# Patient Record
Sex: Female | Born: 1955 | Race: White | Hispanic: No | Smoking: Current every day smoker
Health system: Southern US, Community
[De-identification: ages and names within clinical notes are randomized; demographics above are authoritative.]

## PROBLEM LIST (undated history)

## (undated) DIAGNOSIS — G5603 Carpal tunnel syndrome, bilateral upper limbs: Secondary | ICD-10-CM

## (undated) DIAGNOSIS — K769 Liver disease, unspecified: Secondary | ICD-10-CM

## (undated) DIAGNOSIS — K5909 Other constipation: Secondary | ICD-10-CM

## (undated) DIAGNOSIS — M5136 Other intervertebral disc degeneration, lumbar region: Secondary | ICD-10-CM

## (undated) DIAGNOSIS — F32A Depression, unspecified: Secondary | ICD-10-CM

## (undated) DIAGNOSIS — E039 Hypothyroidism, unspecified: Secondary | ICD-10-CM

## (undated) DIAGNOSIS — B1920 Unspecified viral hepatitis C without hepatic coma: Secondary | ICD-10-CM

## (undated) DIAGNOSIS — H539 Unspecified visual disturbance: Secondary | ICD-10-CM

## (undated) DIAGNOSIS — F329 Major depressive disorder, single episode, unspecified: Secondary | ICD-10-CM

## (undated) DIAGNOSIS — Z87442 Personal history of urinary calculi: Secondary | ICD-10-CM

## (undated) DIAGNOSIS — R161 Splenomegaly, not elsewhere classified: Secondary | ICD-10-CM

## (undated) DIAGNOSIS — R079 Chest pain, unspecified: Secondary | ICD-10-CM

## (undated) DIAGNOSIS — I10 Essential (primary) hypertension: Secondary | ICD-10-CM

## (undated) DIAGNOSIS — I4892 Unspecified atrial flutter: Secondary | ICD-10-CM

## (undated) DIAGNOSIS — M797 Fibromyalgia: Secondary | ICD-10-CM

## (undated) DIAGNOSIS — G629 Polyneuropathy, unspecified: Secondary | ICD-10-CM

## (undated) DIAGNOSIS — D696 Thrombocytopenia, unspecified: Secondary | ICD-10-CM

## (undated) DIAGNOSIS — J449 Chronic obstructive pulmonary disease, unspecified: Secondary | ICD-10-CM

## (undated) DIAGNOSIS — I4891 Unspecified atrial fibrillation: Secondary | ICD-10-CM

## (undated) DIAGNOSIS — M51369 Other intervertebral disc degeneration, lumbar region without mention of lumbar back pain or lower extremity pain: Secondary | ICD-10-CM

## (undated) DIAGNOSIS — K7460 Unspecified cirrhosis of liver: Secondary | ICD-10-CM

## (undated) HISTORY — DX: Chronic obstructive pulmonary disease, unspecified: J44.9

## (undated) HISTORY — DX: Major depressive disorder, single episode, unspecified: F32.9

## (undated) HISTORY — DX: Other constipation: K59.09

## (undated) HISTORY — PX: CARDIAC CATHETERIZATION: SHX172

## (undated) HISTORY — DX: Liver disease, unspecified: K76.9

## (undated) HISTORY — PX: PARTIAL HYSTERECTOMY: SHX80

## (undated) HISTORY — DX: Unspecified visual disturbance: H53.9

## (undated) HISTORY — DX: Essential (primary) hypertension: I10

## (undated) HISTORY — DX: Unspecified viral hepatitis C without hepatic coma: B19.20

## (undated) HISTORY — DX: Polyneuropathy, unspecified: G62.9

## (undated) HISTORY — PX: UPPER GASTROINTESTINAL ENDOSCOPY: SHX188

## (undated) HISTORY — DX: Fibromyalgia: M79.7

## (undated) HISTORY — DX: Unspecified atrial flutter: I48.92

## (undated) HISTORY — PX: COLONOSCOPY: SHX174

## (undated) HISTORY — DX: Carpal tunnel syndrome, bilateral upper limbs: G56.03

## (undated) HISTORY — DX: Unspecified atrial fibrillation: I48.91

## (undated) HISTORY — DX: Splenomegaly, not elsewhere classified: R16.1

## (undated) HISTORY — DX: Depression, unspecified: F32.A

## (undated) SURGERY — ESOPHAGOGASTRODUODENOSCOPY (EGD) WITH PROPOFOL
Anesthesia: Monitor Anesthesia Care

---

## 1998-03-18 ENCOUNTER — Observation Stay (HOSPITAL_COMMUNITY): Admission: EM | Admit: 1998-03-18 | Discharge: 1998-03-19 | Payer: Self-pay | Admitting: Emergency Medicine

## 2002-09-24 DIAGNOSIS — B1920 Unspecified viral hepatitis C without hepatic coma: Secondary | ICD-10-CM

## 2002-09-24 HISTORY — DX: Unspecified viral hepatitis C without hepatic coma: B19.20

## 2003-09-25 HISTORY — PX: OTHER SURGICAL HISTORY: SHX169

## 2004-03-24 ENCOUNTER — Inpatient Hospital Stay (HOSPITAL_COMMUNITY): Admission: AD | Admit: 2004-03-24 | Discharge: 2004-03-25 | Payer: Self-pay | Admitting: Internal Medicine

## 2007-01-10 ENCOUNTER — Encounter: Payer: Self-pay | Admitting: Cardiology

## 2007-02-04 ENCOUNTER — Ambulatory Visit: Payer: Self-pay | Admitting: Gastroenterology

## 2007-02-04 LAB — CONVERTED CEMR LAB
ALT: 38 units/L (ref 0–40)
AST: 38 units/L — ABNORMAL HIGH (ref 0–37)
Albumin: 3.7 g/dL (ref 3.5–5.2)
Alkaline Phosphatase: 81 units/L (ref 39–117)
Amylase: 143 units/L — ABNORMAL HIGH (ref 27–131)
BUN: 11 mg/dL (ref 6–23)
Bacteria, UA: NEGATIVE
Basophils Absolute: 0.1 10*3/uL (ref 0.0–0.1)
Basophils Relative: 0.7 % (ref 0.0–1.0)
Bilirubin Urine: NEGATIVE
Bilirubin, Direct: 0.2 mg/dL (ref 0.0–0.3)
CO2: 32 meq/L (ref 19–32)
CRP, High Sensitivity: 1 (ref 0.00–5.00)
Calcium: 10.3 mg/dL (ref 8.4–10.5)
Chloride: 102 meq/L (ref 96–112)
Creatinine, Ser: 0.7 mg/dL (ref 0.4–1.2)
Crystals: NEGATIVE
Eosinophils Absolute: 0.1 10*3/uL (ref 0.0–0.6)
Eosinophils Relative: 1.5 % (ref 0.0–5.0)
GFR calc Af Amer: 114 mL/min
GFR calc non Af Amer: 95 mL/min
Glucose, Bld: 88 mg/dL (ref 70–99)
HCT: 44 % (ref 36.0–46.0)
Hemoglobin, Urine: NEGATIVE
Hemoglobin: 15 g/dL (ref 12.0–15.0)
Ketones, ur: NEGATIVE mg/dL
Leukocytes, UA: NEGATIVE
Lipase: 87 units/L — ABNORMAL HIGH (ref 11.0–59.0)
Lymphocytes Relative: 28.4 % (ref 12.0–46.0)
MCHC: 34 g/dL (ref 30.0–36.0)
MCV: 90 fL (ref 78.0–100.0)
Monocytes Absolute: 0.7 10*3/uL (ref 0.2–0.7)
Monocytes Relative: 6.7 % (ref 3.0–11.0)
Mucus, UA: NEGATIVE
Neutro Abs: 6.3 10*3/uL (ref 1.4–7.7)
Neutrophils Relative %: 62.7 % (ref 43.0–77.0)
Nitrite: NEGATIVE
Platelets: 164 10*3/uL (ref 150–400)
Potassium: 4.5 meq/L (ref 3.5–5.1)
RBC / HPF: NONE SEEN
RBC: 4.89 M/uL (ref 3.87–5.11)
RDW: 13 % (ref 11.5–14.6)
Sed Rate: 27 mm/hr — ABNORMAL HIGH (ref 0–25)
Sodium: 141 meq/L (ref 135–145)
Specific Gravity, Urine: 1.01 (ref 1.000–1.03)
TSH: 3.39 microintl units/mL (ref 0.35–5.50)
Total Bilirubin: 0.6 mg/dL (ref 0.3–1.2)
Total Protein, Urine: NEGATIVE mg/dL
Total Protein: 8 g/dL (ref 6.0–8.3)
Urine Glucose: NEGATIVE mg/dL
Urobilinogen, UA: 1 (ref 0.0–1.0)
WBC: 10 10*3/uL (ref 4.5–10.5)
pH: 7 (ref 5.0–8.0)

## 2007-02-11 ENCOUNTER — Encounter: Admission: RE | Admit: 2007-02-11 | Discharge: 2007-02-11 | Payer: Self-pay | Admitting: Gastroenterology

## 2007-02-28 DIAGNOSIS — D126 Benign neoplasm of colon, unspecified: Secondary | ICD-10-CM | POA: Insufficient documentation

## 2007-03-04 ENCOUNTER — Ambulatory Visit: Payer: Self-pay | Admitting: Gastroenterology

## 2007-03-10 ENCOUNTER — Ambulatory Visit: Payer: Self-pay | Admitting: Gastroenterology

## 2007-03-10 ENCOUNTER — Encounter: Payer: Self-pay | Admitting: Gastroenterology

## 2007-03-10 DIAGNOSIS — K2100 Gastro-esophageal reflux disease with esophagitis, without bleeding: Secondary | ICD-10-CM | POA: Insufficient documentation

## 2007-03-21 ENCOUNTER — Ambulatory Visit: Payer: Self-pay | Admitting: Gastroenterology

## 2007-03-25 ENCOUNTER — Other Ambulatory Visit: Admission: RE | Admit: 2007-03-25 | Discharge: 2007-03-25 | Payer: Self-pay | Admitting: Gynecology

## 2008-01-21 DIAGNOSIS — F329 Major depressive disorder, single episode, unspecified: Secondary | ICD-10-CM | POA: Insufficient documentation

## 2008-01-21 DIAGNOSIS — I10 Essential (primary) hypertension: Secondary | ICD-10-CM | POA: Insufficient documentation

## 2008-10-08 ENCOUNTER — Encounter: Payer: Self-pay | Admitting: Cardiology

## 2008-10-14 ENCOUNTER — Encounter: Payer: Self-pay | Admitting: Cardiology

## 2008-10-19 ENCOUNTER — Encounter: Payer: Self-pay | Admitting: Cardiology

## 2008-10-20 ENCOUNTER — Ambulatory Visit: Payer: Self-pay | Admitting: Cardiology

## 2008-10-21 ENCOUNTER — Ambulatory Visit: Payer: Self-pay | Admitting: Cardiovascular Disease

## 2008-10-21 ENCOUNTER — Inpatient Hospital Stay (HOSPITAL_COMMUNITY): Admission: AD | Admit: 2008-10-21 | Discharge: 2008-10-22 | Payer: Self-pay | Admitting: Cardiovascular Disease

## 2008-10-21 ENCOUNTER — Encounter: Payer: Self-pay | Admitting: Cardiology

## 2008-11-16 ENCOUNTER — Ambulatory Visit: Payer: Self-pay | Admitting: Cardiology

## 2009-04-25 DIAGNOSIS — E785 Hyperlipidemia, unspecified: Secondary | ICD-10-CM | POA: Insufficient documentation

## 2009-04-25 DIAGNOSIS — I2510 Atherosclerotic heart disease of native coronary artery without angina pectoris: Secondary | ICD-10-CM | POA: Insufficient documentation

## 2009-04-25 DIAGNOSIS — E663 Overweight: Secondary | ICD-10-CM | POA: Insufficient documentation

## 2009-05-09 ENCOUNTER — Telehealth: Payer: Self-pay | Admitting: Cardiology

## 2009-05-14 ENCOUNTER — Ambulatory Visit: Payer: Self-pay | Admitting: Cardiology

## 2009-08-22 ENCOUNTER — Telehealth (INDEPENDENT_AMBULATORY_CARE_PROVIDER_SITE_OTHER): Payer: Self-pay | Admitting: *Deleted

## 2010-02-13 ENCOUNTER — Ambulatory Visit: Payer: Self-pay | Admitting: Cardiology

## 2010-10-15 ENCOUNTER — Encounter: Payer: Self-pay | Admitting: Gastroenterology

## 2010-10-15 ENCOUNTER — Encounter: Payer: Self-pay | Admitting: Endocrinology

## 2010-10-24 NOTE — Progress Notes (Signed)
Summary: Needs Plavix Refill      Allergies Added: SULFAMETHOXAZOLE (SULFAMETHOXAZOLE) SULFAMETHOXAZOLE (SULFAMETHOXAZOLE) SULFAMETHOXAZOLE (SULFAMETHOXAZOLE) SULFAMETHOXAZOLE (SULFAMETHOXAZOLE) AMPICILLIN (AMPICILLIN) AMPICILLIN (AMPICILLIN) Phone Note Call from Patient Call back at Home Phone [phone]   Summary of Call: Pt left message on voicemail stating she has 1 week of her Plavix left. She receives this through the assistance program. Spoke with Clydie Braun at [employer]. Pt's Plavix will be shipped and should be received within 5-7 days.   Left message for patient to call back. Initial call taken by: Cyril Loosen, RN, BSN,  May 09, 2009 5:03 PM  Follow-up for Phone Call        Pt notified. Pt verbalized understanding.  Follow-up by: Cyril Loosen, RN, BSN,  May 10, 2009 2:52 PM   New Allergies: SULFAMETHOXAZOLE (SULFAMETHOXAZOLE) SULFAMETHOXAZOLE (SULFAMETHOXAZOLE) SULFAMETHOXAZOLE (SULFAMETHOXAZOLE) SULFAMETHOXAZOLE (SULFAMETHOXAZOLE) AMPICILLIN (AMPICILLIN) AMPICILLIN (AMPICILLIN) New Allergies: SULFAMETHOXAZOLE (SULFAMETHOXAZOLE) SULFAMETHOXAZOLE (SULFAMETHOXAZOLE) SULFAMETHOXAZOLE (SULFAMETHOXAZOLE) SULFAMETHOXAZOLE (SULFAMETHOXAZOLE) AMPICILLIN (AMPICILLIN) AMPICILLIN (AMPICILLIN)

## 2010-10-24 NOTE — Procedures (Signed)
Summary: Gastroenterology Col  Gastroenterology Col   Imported By: June McMurray CMA 01/22/2008 10:25:00  _____________________________________________________________________  External Attachment:    Type:   Image     Comment:   External Document

## 2010-10-24 NOTE — Procedures (Signed)
Summary: Gastroenterology Egd  Gastroenterology Egd   Imported By: June McMurray CMA 01/22/2008 10:25:53  _____________________________________________________________________  External Attachment:    Type:   Image     Comment:   External Document

## 2010-10-24 NOTE — Progress Notes (Signed)
Summary: plavix pt. assistance  Phone Note Call from Patient   Summary of Call: Request to refill Plavix thru patient assistance.  Also, states she is totally out.  Will give #8 samples.  Lot [id number]  Exp. 10/2010. Plavix order placed for refill, should arrive 5-7 days.  Patient verbalized understanding.  Initial call taken by: Hoover Brunette, LPN,  August 22, 2009 4:58 PM

## 2011-01-08 LAB — GLUCOSE, CAPILLARY
Glucose-Capillary: 152 mg/dL — ABNORMAL HIGH (ref 70–99)
Glucose-Capillary: 68 mg/dL — ABNORMAL LOW (ref 70–99)
Glucose-Capillary: 86 mg/dL (ref 70–99)
Glucose-Capillary: 90 mg/dL (ref 70–99)
Glucose-Capillary: 90 mg/dL (ref 70–99)

## 2011-01-08 LAB — CBC
HCT: 41.7 % (ref 36.0–46.0)
Hemoglobin: 14 g/dL (ref 12.0–15.0)
MCHC: 33.5 g/dL (ref 30.0–36.0)
MCV: 94.8 fL (ref 78.0–100.0)
Platelets: 93 10*3/uL — ABNORMAL LOW (ref 150–400)
RBC: 4.4 MIL/uL (ref 3.87–5.11)
RDW: 13.8 % (ref 11.5–15.5)
WBC: 5.7 10*3/uL (ref 4.0–10.5)

## 2011-01-08 LAB — HEMOGLOBIN A1C
Hgb A1c MFr Bld: 6.6 % — ABNORMAL HIGH (ref 4.6–6.1)
Mean Plasma Glucose: 143 mg/dL

## 2011-01-08 LAB — BASIC METABOLIC PANEL
BUN: 9 mg/dL (ref 6–23)
CO2: 27 mEq/L (ref 19–32)
Calcium: 8.5 mg/dL (ref 8.4–10.5)
Chloride: 100 mEq/L (ref 96–112)
Creatinine, Ser: 0.59 mg/dL (ref 0.4–1.2)
GFR calc Af Amer: >60 mL/min (ref >60)
GFR calc non Af Amer: >60 mL/min (ref >60)
Glucose, Bld: 78 mg/dL (ref 70–99)
Potassium: 4.6 mEq/L (ref 3.5–5.1)
Sodium: 133 mEq/L — ABNORMAL LOW (ref 135–145)

## 2011-02-06 NOTE — Cardiovascular Report (Signed)
NAME:  Teresa Benitez, Teresa Benitez             ACCOUNT NO.:  [id number]   MEDICAL RECORD NO.:  [id number]          PATIENT TYPE:  INP   LOCATION:  2506                         FACILITY:  [facility]   PHYSICIAN:  Verne Carrow, MDDATE OF BIRTH:  1956/05/14   DATE OF PROCEDURE:  10/21/2008  DATE OF DISCHARGE:                            CARDIAC CATHETERIZATION   PRIMARY CARDIOLOGIST:  Learta Codding, MD, [facility]   PROCEDURE PERFORMED:  1. Left heart catheterization.  2. Selective coronary angiography.  3. Left ventricular angiogram.  4. Percutaneous coronary intervention with placement of a drug-eluting      stent in the mid right coronary artery.  5. Placement of an Angio-Seal femoral artery closure device.   OPERATOR:  Verne Carrow, MD   INDICATIONS:  This is a 55 year old obese Caucasian female with a  history of diabetes mellitus, hypertension, hyperlipidemia and tobacco  abuse who presented to [facility] [place]  complaints of chest pain.  She ruled out for a myocardial infarction  with serial cardiac enzymes, however, she was referred for a stress  perfusion test which showed a possible anterior reversible defect as  well as a reversible mid, basal inferior defect.  The patient was sent  to [facility] today for a left heart catheterization.   DETAILS OF PROCEDURE:  The patient was brought to the Inpatient Cardiac  Catheterization Laboratory after signing informed consent for the  procedure.  The right groin was prepped and draped in a sterile fashion.  Lidocaine 1% was used for local anesthesia.  A 5-French sheath was  inserted into the right femoral artery without difficulty.  Standard  diagnostic catheters were used to perform the selective coronary  angiography.  A 5-French pigtail catheter was used across the aortic  valve into the left ventricle.  Following the performance of a left  ventricular angiogram, the catheter was pulled back  across the aortic  valve with no significant pressure gradient measured.   At this point of the case, we elected to proceed to intervention of the  mid right coronary artery.  The patient was given a bolus of Angiomax  and a drip was started.  She was given 600 mg of Plavix on the cath  table.  A JR-4 guiding catheter was used to selectively engage the right  coronary artery.  A Cougar intracoronary wire was then passed down the  right coronary artery without difficulty.  A 2.5 x 12-mm balloon was  used for predilatation of the lesion.  A 3.0 x 18 mm Endeavor drug-  eluting stent was placed in the mid right coronary artery.  A 3.25 x 15-  mm noncompliant balloon was used for postdilatation of the lesion.  The  stenosis prior to the intervention was 90% and following the  intervention was 0%.  There was excellent flow down the vessel following  the intervention.  The patient tolerated the procedure well.  An Angio-  Seal femoral artery closure device was placed in the right femoral  artery for hemostasis.  The patient was taken to the holding area in  stable condition.  ANGIOGRAPHIC FINDINGS:  1. The left main coronary artery has no significant disease.  2. The left anterior descending has a 30% plaque in the proximal      portion and a 30% plaque in the midportion.  3. The circumflex artery has no significant obstructive disease.  4. The right coronary artery has a 90% stenosis in the midportion of      the vessel.  There is plaque disease noted in the posterior      descending branch.  5. Left ventricular angiogram shows normal left ventricular systolic      function with no wall motion abnormalities.  Ejection fraction is      estimated at 55-60%.  There is no evidence of mitral regurgitation.   HEMODYNAMIC DATA:  Central aortic pressure 118/77.  Left ventricular  pressure 112/5, end-diastolic pressure 16.   IMPRESSION:  1. Single-vessel coronary artery disease.  2. Normal left  ventricular systolic function.  3. Successful percutaneous coronary intervention with placement of a      drug-eluting stent in the mid right coronary artery.   RECOMMENDATIONS:  The patient will be continued on aspirin and Plavix  for at least 1 year.  She will be admitted to the floor and watched  closely tonight.  Her home medications will be resumed.      Verne Carrow, MD  Electronically Signed     CM/MEDQ  D:  10/21/2008  T:  10/22/2008  Job:  [id number]   cc:   Learta Codding, MD,FACC

## 2011-02-06 NOTE — Assessment & Plan Note (Signed)
[facility]                         GASTROENTEROLOGY OFFICE NOTE   NAME:Teresa Benitez, Teresa Benitez                    MRN:          [id number]  DATE:03/21/2007                            DOB:          1956-08-11    [place] continues with constant left lower quadrant pain of unexplained  etiology.  On reviewing her records, it has been there for approximately  two years and she has had a negative GI workup and has had several CT  scans, the last one performed on Feb 11, 2007, at [facility].  She is status post hysterectomy and tubal ligation.  She has not had  gynecologic exam at least ten years.  I referred her to Dr. Lily Peer  for gynecologic exam and possible laparoscopy.   Today, she weighs 230 pounds, the blood pressure is 142/80, pulse was  72.  I could not appreciate hepatosplenomegaly, abdominal masses or  tenderness.  Bowel sounds were normal.     Vania Rea. Jarold Motto, MD, Caleen Essex, FAGA  Electronically Signed    DRP/MedQ  DD: 03/21/2007  DT: 03/21/2007  Job #: [id number]   cc:   Gaetano Hawthorne. Lily Peer, M.D.  Wyvonnia Lora

## 2011-02-06 NOTE — Assessment & Plan Note (Signed)
[facility]                          [facility] OFFICE NOTE   NAME:Teresa Benitez, Teresa Benitez                    MRN:          [id number]  DATE:11/16/2008                            DOB:          [birth year]    PRIMARY CARE PHYSICIAN:  Dr. Wyvonnia Lora.   HISTORY OF PRESENT ILLNESS:  The patient is a very pleasant 55 year old  female recently diagnosed with coronary artery disease.  The patient  underwent stenting of the right coronary artery with placement of an  Endeavor drug-eluting stent after she had a positive Cardiolite stress  study.  There was a high-grade lesion in the mid RCA.  She also has some  known hepatitis C and she is planning to see Dr. Loreta Ave in the near  future.  She has been plagued with chronic abdominal pain.  Next, from a  cardiac perspective, however, the patient is doing remarkably well.  She  is not exercising and she states that she cannot believe how she is able  to exercise, she is now doing 30 minutes on the treadmill without any  difficulty.  She feels her stamina has improved, her energy level.  She  is also moderate with her diet.  She has noted cardiovascular  complaints.  In particular, she has no palpitations, presyncope,  syncope, orthopnea, or PND.   MEDICATIONS:  1. Plavix 75 mg p.o. daily.  2. Metoprolol 50 mg p.o. b.i.d.  3. Lisinopril 25/4.5 mg p.o. b.i.d.  4. Citalopram 40 mg p.o. day.  5. Metformin 1000 mg 1 tablet p.o. b.i.d.  6. Aspirin 325 mg p.o. daily.   PHYSICAL EXAMINATION:  VITAL SIGNS:  Blood pressure 132/77, heart rate  60, weight 131 pounds.  NECK:  Normal carotid upstrokes, no carotid bruits.  LUNGS:  Clear breath sounds bilaterally.  HEART:  Regular rate and rhythm with normal S1 and S2.  No murmurs,  rubs, or gallops.  ABDOMEN:  Soft, nontender.  No rebound or guarding.  Good bowel sounds.  EXTREMITIES:  No cyanosis, clubbing, or edema.   PROBLEM LIST:  1. Coronary arteries status post  Endeavor drug-eluting stent to the      RCA.  2. Hypertension.  3. Dyslipidemia.  4. Type 2 diabetes mellitus.  5. Hepatitis C.  6. Remote history of Graves disease.  7. Status post parathyroidectomy.  8. Status post partial hysterectomy.   IMPRESSION:  1. Obesity.  2. Tobacco use.   PLAN:  1. I have counseled the patient regarding discontinuation of tobacco.  2. From a cardiac standpoint, no other changes need to be made.  The      patient is asymptomatic.  She has several nitroglycerin available.  3. The patient and I scheduled appointment with Dr. Karilyn Cota for      followup regarding her hepatitis C status.     Learta Codding, MD,FACC     GED/MedQ  DD: 11/16/2008  DT: 11/16/2008  Job #: [id number]   cc:   Wyvonnia Lora

## 2011-02-06 NOTE — Assessment & Plan Note (Signed)
[facility]                         GASTROENTEROLOGY OFFICE NOTE   Teresa Benitez, Teresa Benitez                      MRN:          [id number]  DATE:02/04/2007                            DOB:          07/20/1956    MAIN COMPLAINT:  Teresa Benitez is referred for constant unrelenting left  lower quadrant pain.   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 Geraldine Solar.  Associated with this, has been a constant left lower quadrant discomfort  that has been lasting over a year, and has been worsening in intensity.  She says it is rather constant, and rated a 10 out of 10 over the last 3  weeks.  The pain is dull pain with sharp components to the left lower  quadrant radiating around into her left back area.  She has had mild  nausea, but no emesis, and denies any upper GI or hepatobiliary  complaints.   She had similar pain a year ago, and was evaluated by Dr. Anson [place] in  [place], and had a normal endoscopy and colonoscopy.  She had recent CT  scan on January 06, 2007 in [place], which showed a 29 mm x 13 mm density  superior to the pancreas felt to be a prominent intraabdominal lymph  node.  There were some other lymph nodes in the region of the  gastrohepatic ligament, but otherwise the CT scan was unremarkable.  Because of some facial numbness, she also had MRI of the brain, which  showed some scattered areas of abnormal T2 signaling in the cerebral  white matter and brain stem, which were nonspecific, and consistent with  either ischemic disease or demyelinating disease.  Her only neurologic  complaints at this time is one of some numbness in her left leg  associated with this left lower quadrant pain.  The pain seems to be  relieved somewhat by having a bowel movement, and her stools are ribbon  shaped.  She has had no melena or hematochezia.  She does complain of  night sweats,  and has had no true fever or chills.  She has had no  associated skin rashes, joint pains, oral stomatitis, etc.  As mentioned  above, she has no specific upper GI or hepatobiliary problems.  She  follows a regular diet, and has had no significant weight loss.   PAST MEDICAL HISTORY:  Remarkable for hypertension for the last 8 years  along with greater than 10 years of depression.  She has had a previous  hysterectomy.  Previous surgery for parathyroid adenoma.   MEDICATIONS:  1. Lisinopril of questionable dose a day.  2. Metoprolol 20 mg twice a day.  3. Paxil 10 mg a day.   IN THE PAST SHE HAS HAD REACTIONS TO AMPICILLIN AND SULFA.   FAMILY HISTORY:  Noncontributory.   SOCIAL HISTORY:  She is divorced and lives with her mother.  She has a  Administrator, arts.  Works as a Soil scientist.  She does smoke daily.  Uses  ethanol socially.  Denies problems  with alcohol abuse.   REVIEW OF SYSTEMS:  Otherwise noncontributory.  She says that she had a  last menstrual period in June 2007.  On reviewing her chart, it appears  that she had a partial hysterectomy in 1998.   EXAMINATION:  Shows her to be a healthy-appearing white female, in no  acute distress.  Has stigmata of chronic liver disease.  She is 5 feet 11 inches, and weighs 234 pounds.  Blood pressure is  132/98.  Pulse was 64 and regular.  CHEST:  Clear.  She appeared to be in a regular rhythm without significant murmurs,  gallops, or rubs.  ABDOMEN:  Shows no distention or definite organomegaly.  There was some  tenderness to deep palpation of the left lower quadrant without rebound  tenderness, and no focal masses.  Bowel sounds were normal.  Peripheral extremities were generally unremarkable.  RECTAL EXAM:  Showed no masses or tenderness.  There was formed stool in  the rectal vault that was guaiac negative.  Mental status was clear.   ASSESSMENT:  Teresa Benitez has very atypical left lower quadrant pain  with rather  negative workup to date, including endoscopy and colonoscopy  a year ago.  It is certainly possible that she has some chronic  underlying inflammatory bowel disease.  It is unlikely from this pain,  location and pattern, that she pancreatitis or pancreatic carcinoma  despite the above mentioned CT scan report.   RECOMMENDATIONS:  1. Check screening lab parameters including repeat CBC, sed rate, CRP,      urinalysis, metabolic profile.  2. CT scan of the abdomen and pelvis again with contrast as soon as      possible.  3. Percodan 1 every 6 hours as needed for pain until workup completed.  4. Patient may well need followup colonoscopy exam.     Vania Rea. Jarold Motto, MD, [facility] Essex, FAGA  Electronically Signed    DRP/MedQ  DD: 02/04/2007  DT: 02/04/2007  Job #: [id number]   cc:   Wyvonnia Lora

## 2011-02-06 NOTE — Discharge Summary (Signed)
NAME:  Teresa Benitez, Teresa Benitez             ACCOUNT NO.:  [id number]   MEDICAL RECORD NO.:  [id number]          PATIENT TYPE:  INP   LOCATION:  2506                         FACILITY:  [facility]   PHYSICIAN:  Jesse Sans. Wall, MD, FACCDATE OF BIRTH:  1956/07/12   DATE OF ADMISSION:  10/21/2008  DATE OF DISCHARGE:  10/22/2008                               DISCHARGE SUMMARY   PRIMARY CARDIOLOGIST:  Learta Codding, MD, [facility].   PRIMARY CARE [place]:  Dr. Wyvonnia Lora in [place].   DISCHARGE DIAGNOSIS:  Unstable angina.   SECONDARY DIAGNOSES:  1. Coronary artery disease, status post successful percutaneous      coronary intervention and stenting of the right coronary artery      with placement of a 3.0 x 18-mm endeavor drug-eluting stent      performed this admission.  2. Hypertension.  3. Hyperlipidemia.  4. Type 2 diabetes mellitus.  5. Hepatitis C.  6. Remote history of Graves disease.  7. Status post parathyroidectomy.  8. Status post partial hysterectomy.  9. Depression.  10.Obesity.  11.Ongoing tobacco abuse.   ALLERGIES:  PENICILLIN and SULFA.   PROCEDURES:  Left heart cardiac catheterization with successful PCI and  stenting of the right coronary artery as above.   HISTORY OF PRESENT ILLNESS:  A 55 year old Caucasian female with prior  history of chest pain, status post catheterization in July 2005  revealing nonobstructive disease, who was admitted to [facility]  on October 20, 2008, secondary to recurrent chest discomfort with  associated left arm pain, dyspnea, diaphoresis, and nausea.  She ruled  out for MI and subsequently underwent adenosine Cardiolite.  This  revealed normal LV function and suggestion of a small completely  reversible mid anterior defect as well as an inferobasal defect.  There  was some concern for balanced ischemia as well.  The patient was  transferred to [facility] for further evaluation.   HOSPITAL COURSE:  Left heart cardiac catheterization was performed  on  October 21, 2008, revealing 90% stenosis in the mid right coronary  artery with otherwise nonobstructive disease.  EF was 55% with normal  wall motion.  The RCA was successfully stented with 3.0 x 18-mm endeavor  drug-eluting stent.  The patient tolerated the procedure well,  postprocedure was has not had any difficulty.  She has been educated on  the importance of smoking cessation.  We did reduce her metoprolol dose  secondary to bradycardia noted on telemetry with rates in the 40s.  This  was asymptomatic.  Ms. Whitehead will be discharged home today in good  condition.   DISCHARGE LABORATORY DATA:  Hemoglobin 14.0, hematocrit 41.7, WBC 5.7,  and platelets 93,000 (baseline was 100,000 at [facility]).  Sodium 133, potassium 4.6, chloride 100, CO2 27, BUN 9, creatinine 0.59,  glucose 78, and calcium 8.5.   DISPOSITION:  The patient will be discharged home today in good  condition.   FOLLOWUP PLANS AND APPOINTMENTS:  We have arranged for followup with Dr.  Andee Lineman on November 04, 2008, at 2:30 p.m.  She has followup with Dr.  Margo Common as previously scheduled.  DISCHARGE MEDICATIONS:  1. Aspirin 325 mg daily.  2. Plavix 75 mg daily.  3. Lopressor 50 mg b.i.d.  4. Lisinopril and hydrochlorothiazide 20/12.5 mg daily.  5. Celexa 40 mg daily.  6. Metformin 1000 mg b.i.d. to resume on October 24, 2008.  7. Nitroglycerin 0.4 mg sublingual p.r.n. chest pain.   Of note, we did not initiate statin therapy secondary to elevated LFTs  noted at [facility] with an AST of 137 and ALT of 114.  We will  defer statin initiation to the outpatient setting if felt to be  appropriate.   OUTSTANDING LABORATORY STUDIES:  None.   DURATION OF DISCHARGE ENCOUNTER:  45 minutes including physician time.       Nicolasa Ducking, ANP      Jesse Sans. Daleen Squibb, MD, [facility]  Electronically Signed    CB/MEDQ  D:  10/22/2008  T:  10/23/2008  Job:  [id number]   cc:   Wyvonnia Lora

## 2011-02-09 NOTE — Discharge Summary (Signed)
NAME:  Teresa Benitez, Teresa Benitez                         ACCOUNT NO.:  [id number]   MEDICAL RECORD NO.:  [id number]                   PATIENT TYPE:  INP   LOCATION:  4743                                 FACILITY:  [facility]   PHYSICIAN:  Pricilla Riffle, M.D.                 DATE OF BIRTH:  Feb 04, 1956   DATE OF ADMISSION:  03/24/2004  DATE OF DISCHARGE:  03/25/2004                                 DISCHARGE SUMMARY   PROCEDURES:  1. Cardiac catheterization.  2. Coronary arteriogram.  3. Left ventriculogram.   HOSPITAL COURSE:  Ms. Mcdougald is a 56 year old female with no known  history of coronary artery disease.  She went to [facility] for chest  pain that she describes as an elephant sitting on her chest.  It radiated to  her left arm.  Her EKG had some nonspecific ST and T wave changes and Dr.  Jonelle Sidle evaluated her and felt that she should be cathed.  She  was transferred to [facility].   Her cardiac catheterization showed a 20% to 30% LAD and no other coronary  artery disease.  Her EF was 60% to 65% with no MR.  Dr. Learta Codding  evaluated the films and felt that a D-dimer should be drawn to rule out PE,  which was negative at less than 0.22.  He felt that she needed smoking  cessation, increased activity and a low-fat diet.   As part of her evaluation, a lipid profile was performed.  Her lipid profile  showed a total cholesterol of 187, triglycerides 89, HDL 37, LDL 132.  Because she did not have significant coronary artery disease, no medical  therapy will be started at this time, though she may need this in the  future.  She will be encouraged to exercise and follow a heart-healthy diet.   On March 25, 2004, her groin was without ecchymosis or hematoma.  She was  ambulating without chest pain or shortness of breath and considered stable  for discharge on March 25, 2004.   FOLLOWUP:  She is to follow up as an outpatient with her primary care  physician and with  cardiology on a p.r.n. basis.   DISCHARGE CONDITION:  Improved.   DISCHARGE DIAGNOSES:  1. Chest pain, D-dimer negative for pulmonary embolus, cardiac     catheterization negative for significant coronary artery disease.  2. Hyperlipidemia.  3. Status post hysterectomy.  4. Reported history of Graves' disease.  5. Intolerance/allergy to sulfa.  6. High blood pressure.  7. Arthritis.   DISCHARGE MEDICATIONS:  1. Metoprolol 50 mg p.o. b.i.d.  2. Nifedipine 60 mg daily.  3. Effexor XR 150 mg daily.  4. Lisinopril HCT 20/25 mg p.o. daily.      Theodore Demark, P.A. [facility]                  Pricilla Riffle, M.D.  RB/MEDQ  D:  03/25/2004  T:  03/27/2004  Job:  [id number]   cc:   Wyvonnia Lora  [address]  [place]  [place] [address]  Fax: [phone]   Jonita Albee, [place] [facility]

## 2011-02-09 NOTE — Cardiovascular Report (Signed)
NAME:  Teresa Benitez, Teresa Benitez                         ACCOUNT NO.:  [id number]   MEDICAL RECORD NO.:  [id number]                   PATIENT TYPE:  INP   LOCATION:  4743                                 FACILITY:  [facility]   PHYSICIAN:  Learta Codding, M.D. [facility]             DATE OF BIRTH:  07/07/1956   DATE OF PROCEDURE:  03/24/2004  DATE OF DISCHARGE:                              CARDIAC CATHETERIZATION   PROCEDURE:  1. Left heart catheterization with selective coronary angiography.  2. Ventriculography.   PROCEDURE CARDIOLOGIST:  Learta Codding, M.D. [facility]   CARDIOLOGIST:  Jonelle Sidle, M.D. [facility]   DIAGNOSIS:  No evidence of flow limiting epicardial or coronary artery  disease.   INDICATIONS:  The patient is a 55 year old female with multiple risk factors  for coronary artery disease who reports substernal chest pain.  The patient  ruled out for myocardial infarction and had no acute EKG changes significant  for cardiac catheterization to assess her coronary anatomy.   DESCRIPTION OF PROCEDURE:  After informed consent was obtained. The patient  was brought to the catheterization laboratory.  A 6 French arterial sheath  was placed using modified Seldinger technique.  A 6 [place] and JR4  catheters were used for coronary angiography.  A 6 French angle pigtail  catheter was used for ventriculography.  At the termination of the procedure  all catheters and sheaths were removed; and no complications were  encountered.  Adequate hemostasis was obtained.   FINDINGS:  Hemodynamics:  Left ventricular pressure 115/60 mmHg, aortic  pressure 115/68 mmHg.   VENTRICULOGRAPHY:  Ejection fraction 60-65% with no segmental wall motion  abnormalities and no mitral regurgitation.   SELECTIVE CORONARY ANGIOGRAPHY:  1. The left main coronary artery was a large caliber vessel with no evidence     of flow limiting disease.  2. The left anterior descending artery.  A large caliber vessel without any  flow limiting lesions and no flow limiting lesion in the diagonal     branches.  3. The circumflex coronary artery was normal. There was a very large second     obtuse marginal branch which was free of flow limiting lesions.  The     first obtuse marginal branch was small.  4. The right coronary artery was a large caliber vessel with no evidence of     flow limiting lesions.   RECOMMENDATIONS:  No evidence of significant angiographic disease explaining  the patient's chest pain syndrome.  I suspect the patient had either  noncardiac chest pain or atypical chest pain.  Further evaluation with D-  dimmer is indicated to rule out pulmonary embolism.  It is suspected,  however, that the patient can be discharged in the morning.  Learta Codding, M.D. [facility]    GED/MEDQ  D:  03/24/2004  T:  03/26/2004  Job:  [id number]   cc:   Wyvonnia Lora  [address]  [place]  [place] [address]  Fax: [phone]   Learta Codding, M.D. [facility]   Jonelle Sidle, M.D. [facility]

## 2011-03-09 ENCOUNTER — Other Ambulatory Visit: Payer: Self-pay | Admitting: Diagnostic Neuroimaging

## 2011-03-09 DIAGNOSIS — R531 Weakness: Secondary | ICD-10-CM

## 2011-03-09 DIAGNOSIS — R9089 Other abnormal findings on diagnostic imaging of central nervous system: Secondary | ICD-10-CM

## 2011-03-09 DIAGNOSIS — R200 Anesthesia of skin: Secondary | ICD-10-CM

## 2011-03-13 ENCOUNTER — Ambulatory Visit
Admission: RE | Admit: 2011-03-13 | Discharge: 2011-03-13 | Disposition: A | Discharge status: Home / Self Care | Payer: No Typology Code available for payment source | Source: Ambulatory Visit | Attending: Diagnostic Neuroimaging | Admitting: Diagnostic Neuroimaging

## 2011-03-13 DIAGNOSIS — R200 Anesthesia of skin: Secondary | ICD-10-CM

## 2011-03-13 DIAGNOSIS — R9089 Other abnormal findings on diagnostic imaging of central nervous system: Secondary | ICD-10-CM

## 2011-03-13 DIAGNOSIS — R531 Weakness: Secondary | ICD-10-CM

## 2011-03-14 ENCOUNTER — Other Ambulatory Visit: Payer: Self-pay | Admitting: Diagnostic Neuroimaging

## 2011-03-14 DIAGNOSIS — R5381 Other malaise: Secondary | ICD-10-CM

## 2011-03-14 DIAGNOSIS — R5383 Other fatigue: Secondary | ICD-10-CM

## 2011-03-14 DIAGNOSIS — R209 Unspecified disturbances of skin sensation: Secondary | ICD-10-CM

## 2011-03-14 DIAGNOSIS — R9409 Abnormal results of other function studies of central nervous system: Secondary | ICD-10-CM

## 2011-03-23 ENCOUNTER — Other Ambulatory Visit: Payer: No Typology Code available for payment source

## 2011-04-03 ENCOUNTER — Ambulatory Visit
Admission: RE | Admit: 2011-04-03 | Discharge: 2011-04-03 | Disposition: A | Discharge status: Home / Self Care | Payer: No Typology Code available for payment source | Source: Ambulatory Visit | Attending: Diagnostic Neuroimaging | Admitting: Diagnostic Neuroimaging

## 2011-04-03 DIAGNOSIS — R5383 Other fatigue: Secondary | ICD-10-CM

## 2011-04-03 DIAGNOSIS — R9409 Abnormal results of other function studies of central nervous system: Secondary | ICD-10-CM

## 2011-04-03 DIAGNOSIS — R5381 Other malaise: Secondary | ICD-10-CM

## 2011-04-03 DIAGNOSIS — R209 Unspecified disturbances of skin sensation: Secondary | ICD-10-CM

## 2011-04-03 MED ORDER — GADOBENATE DIMEGLUMINE 529 MG/ML IV SOLN
20.0000 mL | Freq: Once | INTRAVENOUS | Status: AC | PRN
  Administered 2011-04-03: 20 mL via INTRAVENOUS

## 2013-06-05 ENCOUNTER — Encounter (INDEPENDENT_AMBULATORY_CARE_PROVIDER_SITE_OTHER): Payer: Self-pay | Admitting: *Deleted

## 2013-08-03 ENCOUNTER — Ambulatory Visit (INDEPENDENT_AMBULATORY_CARE_PROVIDER_SITE_OTHER): Payer: Medicare Other | Admitting: Internal Medicine

## 2013-08-03 ENCOUNTER — Encounter (INDEPENDENT_AMBULATORY_CARE_PROVIDER_SITE_OTHER): Payer: Self-pay | Admitting: Internal Medicine

## 2013-08-03 VITALS — BP 150/90 | HR 78 | Temp 97.4°F | Resp 16 | Ht 70.0 in | Wt 230.0 lb

## 2013-08-03 DIAGNOSIS — K7460 Unspecified cirrhosis of liver: Secondary | ICD-10-CM | POA: Insufficient documentation

## 2013-08-03 DIAGNOSIS — R1032 Left lower quadrant pain: Secondary | ICD-10-CM | POA: Insufficient documentation

## 2013-08-03 DIAGNOSIS — K5900 Constipation, unspecified: Secondary | ICD-10-CM | POA: Insufficient documentation

## 2013-08-03 DIAGNOSIS — K5909 Other constipation: Secondary | ICD-10-CM | POA: Insufficient documentation

## 2013-08-03 MED ORDER — BISACODYL 10 MG RE SUPP: 10.0000 mg | Freq: Every day | RECTAL | Status: DC | PRN

## 2013-08-03 MED ORDER — LINACLOTIDE 290 MCG PO CAPS: 290.0000 ug | ORAL_CAPSULE | Freq: Every day | ORAL | Status: DC

## 2013-08-03 MED ORDER — HYOSCYAMINE SULFATE 0.125 MG SL SUBL: 0.1250 mg | SUBLINGUAL_TABLET | Freq: Four times a day (QID) | SUBLINGUAL | Status: DC | PRN

## 2013-08-03 NOTE — Patient Instructions (Signed)
Keep stool tarry stool frequency and consistency of stools. Can use Dulcolax suppository or fleets enema daily or every other day as needed.

## 2013-08-03 NOTE — Progress Notes (Signed)
Presenting complaint;  Constipation and left-sided abdominal pain.  History of present illness;  Patient is 57 year old Caucasian female who is referred through courtesy of Dr. Wyvonnia Lora for GI evaluation. Patient states she has had constipation for more than 10 years. Lately her bowels would not move until she takes a laxative and even with laxatives she is having difficulty. She may go as many as12 days without a bowel movement. If she does not have a bowel movement every few days she feels miserable and begins to experience pain in left side of her abdomen. She states this pain goes away completely when her bowels move. Recently she went several days without a bowel movement and finally had results and she drank 2 bottles of mag citrate. She eats fiber rich foods and fruits according to her daughter. She also drinks a lot of water. She has chronic pain secondary to fibromyalgia and has to be on pain medications. She denies melena or rectal bleeding. She has nausea but no vomiting. She is presently on antibiotic for urinary tract infection. Patient's last colonoscopy was in June 2005 by Dr. Jarold Motto with removal of small polyp which was hyperplastic. She had another colonoscopy in April 2007 revealing hyperplastic polyp And a colonoscopy in June 2008 revealing tiny hyperplastic polyps. EGD in June 2008  revealed changes of mild reflux esophagitis.   Current Medications: Current Outpatient Prescriptions  Medication Sig Dispense Refill  . baclofen (LIORESAL) 20 MG tablet Take 20 mg by mouth 3 (three) times daily.      . bisacodyl (DULCOLAX) 5 MG EC tablet Take 5 mg by mouth as needed for moderate constipation.      . budesonide-formoterol (SYMBICORT) 160-4.5 MCG/ACT inhaler Inhale 1 puff into the lungs 2 (two) times daily.      . ciprofloxacin (CIPRO) 500 MG tablet Take 500 mg by mouth 2 (two) times daily.      . citalopram (CELEXA) 20 MG tablet Take 20 mg by mouth daily. Patient takes 1 1/2  tablet daily.      . clonazePAM (KLONOPIN) 1 MG tablet Take 1 mg by mouth at bedtime as needed for anxiety.      . fentaNYL (DURAGESIC - DOSED MCG/HR) 50 MCG/HR Place 50 mcg onto the skin every 3 (three) days.      . Gabapentin Enacarbil ER 300 MG TBCR Take by mouth 3 (three) times daily.      Marland Kitchen LISINOPRIL-HYDROCHLOROTHIAZIDE PO Take by mouth. Patient takes 20/25 mg daily      . MAGNESIUM CITRATE PO Take by mouth as needed.      . Naproxen Sodium (ALEVE PO) Take by mouth as needed.      . ondansetron (ZOFRAN-ODT) 4 MG disintegrating tablet Take 4 mg by mouth every 8 (eight) hours as needed for nausea or vomiting.      Marland Kitchen oxyCODONE (OXY IR/ROXICODONE) 5 MG immediate release tablet Take 5 mg by mouth as needed for severe pain.      . polyethylene glycol (MIRALAX / GLYCOLAX) packet Take 17 g by mouth daily.      . sennosides-docusate sodium (SENOKOT-S) 8.6-50 MG tablet Take 2 tablets by mouth 2 (two) times daily.      . Simethicone (GAS-X MAXIMUM STRENGTH PO) Take by mouth as needed.      . sodium phosphate (FLEET) enema Place 1 enema rectally as needed. follow package directions      . tamsulosin (FLOMAX) 0.4 MG CAPS capsule Take 0.4 mg by mouth.      Marland Kitchen  ketorolac (TORADOL) 10 MG tablet Take 10 mg by mouth every 6 (six) hours as needed.       No current facility-administered medications for this visit.   levothyroxine ? Dose.   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 Chronic hepatitis C for which he was seen by me and referred to [facility] and underwent successful therapy. He has cirrhosis possibly secondary to fatty liver and prior hepatitis C. Chronic left-sided abdominal pain. Hypertension. Obesity. She has had weight problems for over 20 years. Coronary artery disease. She had stenting to RCA in January 2010. Diabetes mellitus. Anxiety and Depression for 25 years. History of kidney stones. Hypothyroidism. COPD diagnosed 2 months  ago. Fibromyalgia was diagnosed in January this year.. Status post parathyroidectomy. C-section   Allergies; Allergies  Allergen Reactions  . Ampicillin     REACTION: unspecified  . Sulfamethoxazole     REACTION: unspecified   Family history; Father died at 47 of ruptured AAA. Mother is 64 years old and in good health. She has 2 brothers also in good health.  Social history; She is divorced. She is accompanied by her daughter who is in good health and she is one son also in good health. She is presently unemployed. She's been smoking cigarettes for 40 years and presently smoking 2 packs per day. She does not drink alcohol.   Objective: Blood pressure 150/90, pulse 78, temperature 97.4 F (36.3 C), temperature source Oral, resp. rate 16, height 5\' 10"  (1.778 m), weight 230 lb (104.327 kg).  patient is alert and in no acute distress. Conjunctiva is pink. Sclera is nonicteric Oropharyngeal mucosa is normal. No neck masses or thyromegaly noted. Cardiac exam with regular rhythm normal S1 and S2. No murmur or gallop noted. Lungs are clear to auscultation. Abdomen is obese. Bowel sounds are normal. Palpation reveals soft abdomen with mild tenderness at LLQ and left midabdomen. No masses or organomegaly noted.   No LE edema or clubbing noted.  Labs/studies Results:  lab data from 07/30/2013. WBC 5.0, H&H 13.2 and 38.9 and platelet count 91K. Bilirubin 0.3, AP 77, AST 24, ALT 18, albumin 3.3 and calcium 8.6. Serum sodium 1:30, potassium 3.5, right 91, CO2 32, BUN 12, creatinine 0.8, and glucose 123. Abdominopelvic CT films from 92 and 07/30/2013 reviewed. She has splenomegaly hepatic contour consistent with cirrhosis and stone in left kidney without obstruction. No evidence of ascites.      Assessment:  #1. Chronic constipation would appear to be most likely secondary to her medications. #2. Chronic LLQ abdominal pain possibly due to IBS and made worse with  constipation. #3. Cirrhosis. She has history of hepatitis C which has been successfully treated. She has thrombocytopenia splenomegaly and liver contour on recent CT is suggesting cirrhosis. 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 Patient must improve her lifestyle with goal of increased physical activity as tolerated and gradually weight reduction otherwise she will experience sequelae of cirrhosis within the next few years.    Recommendations;  Discontinue MiraLax and other OTC laxatives. Patient advised to discontinue Toradol  Advice to use Naprosyn only on as-needed basis. Continue high fiber diet. Linzess 290 mcg by mouth every morning. Levsin sublingual every 6 hours as needed for abdominal pain. Use Dulcolax suppository or Fleet enema daily or every other day as needed. Will request records from the [facility] regarding hep C therapy. Stool diary until office visit in 4 weeks.

## 2013-09-08 ENCOUNTER — Ambulatory Visit (INDEPENDENT_AMBULATORY_CARE_PROVIDER_SITE_OTHER): Payer: Medicaid Other | Admitting: Internal Medicine

## 2013-09-28 ENCOUNTER — Ambulatory Visit (INDEPENDENT_AMBULATORY_CARE_PROVIDER_SITE_OTHER): Payer: Medicare Other | Admitting: Internal Medicine

## 2013-09-28 ENCOUNTER — Encounter (INDEPENDENT_AMBULATORY_CARE_PROVIDER_SITE_OTHER): Payer: Self-pay | Admitting: Internal Medicine

## 2013-09-28 VITALS — BP 104/68 | HR 72 | Temp 98.9°F | Ht 70.0 in | Wt 232.8 lb

## 2013-09-28 DIAGNOSIS — R1032 Left lower quadrant pain: Secondary | ICD-10-CM

## 2013-09-28 DIAGNOSIS — B1920 Unspecified viral hepatitis C without hepatic coma: Secondary | ICD-10-CM | POA: Insufficient documentation

## 2013-09-28 DIAGNOSIS — K5900 Constipation, unspecified: Secondary | ICD-10-CM

## 2013-09-28 NOTE — Patient Instructions (Signed)
CT abdomen/pelvis with CM.  

## 2013-09-28 NOTE — Progress Notes (Signed)
Subjective:     Patient ID: Teresa Benitez, female   DOB: [birth year], 57 y.o.   MRN: [id number]  HPI  Here today for f/u of her constipation. She was last seen in November of 2014 by Dr. Laural Golden.  Hx of constipation for greater than 10 yrs.  She has left sided abdominal pain which resolves after having a BM. She tells me she has some LLQ pain which is the same pain she always has.  She says she cannot even lay on her side.  She has had a fever with this pain. No fever today. Pain for a little over a week She has some nausea. No urinary symptoms.  At times she may go as long as 12 days before having a BM.  She eats foods high in fiber and fruits.  Hx of chronic pain secondary to fibromyalgia and is on several medications.  Placed Linzess 25mcg.  She tells me she is having a BM daily. She does occasionally have diarrhea. She is satisfied with the Linzess.  Hx of Hepatitis C diagnosed 4 yrs ago and was successfully treated at [facility].  She is Genotype 2.  She was treated with interferon and Ribavirin Appetite is good. No weight loss.  She had Lithotripsy in November 2014 for a kidney stone.     Patient's last colonoscopy was in June 2005 by Dr. Sharlett Iles with removal of small polyp which was hyperplastic.  She had another colonoscopy in April 2007 revealing hyperplastic polyp And a colonoscopy in June 2008 revealing tiny hyperplastic polyps.  EGD in June 2008 revealed changes of mild reflux esophagitis.    Labs/studies Results:  lab data from 07/30/2013.  WBC 5.0, H&H 13.2 and 38.9 and platelet count 91K.  Bilirubin 0.3, AP 77, AST 24, ALT 18, albumin 3.3 and calcium 8.6.  Serum sodium 1:30, potassium 3.5, right 91, CO2 32, BUN 12, creatinine 0.8, and glucose 123.  Abdominopelvic CT films from 92 and 07/30/2013 reviewed. She has splenomegaly hepatic contour consistent with cirrhosis and stone in left kidney without obstruction. No evidence of ascites.      Review of  Systems Current Outpatient Prescriptions  Medication Sig Dispense Refill  . baclofen (LIORESAL) 20 MG tablet Take 20 mg by mouth 3 (three) times daily.      . bisacodyl (DULCOLAX) 10 MG suppository Place 1 suppository (10 mg total) rectally daily as needed for moderate constipation.  14 suppository  0  . budesonide-formoterol (SYMBICORT) 160-4.5 MCG/ACT inhaler Inhale 1 puff into the lungs 2 (two) times daily.      . ciprofloxacin (CIPRO) 500 MG tablet Take 500 mg by mouth 2 (two) times daily.      . clonazePAM (KLONOPIN) 1 MG tablet Take 1 mg by mouth at bedtime as needed for anxiety.      . fentaNYL (DURAGESIC - DOSED MCG/HR) 50 MCG/HR Place 50 mcg onto the skin every 3 (three) days.      . Gabapentin Enacarbil ER 300 MG TBCR Take by mouth 3 (three) times daily.      . hyoscyamine (LEVSIN SL) 0.125 MG SL tablet Place 1 tablet (0.125 mg total) under the tongue every 6 (six) hours as needed.  60 tablet  1  . Linaclotide (LINZESS) 290 MCG CAPS capsule Take 1 capsule (290 mcg total) by mouth daily.  30 capsule  5  . LISINOPRIL-HYDROCHLOROTHIAZIDE PO Take by mouth. Patient takes 20/25 mg daily      . Naproxen Sodium (ALEVE  PO) Take by mouth as needed.      . ondansetron (ZOFRAN-ODT) 4 MG disintegrating tablet Take 4 mg by mouth every 8 (eight) hours as needed for nausea or vomiting.      Marland Kitchen oxyCODONE (OXY IR/ROXICODONE) 5 MG immediate release tablet Take 5 mg by mouth as needed for severe pain.      . Simethicone (GAS-X MAXIMUM STRENGTH PO) Take by mouth as needed.      . sodium phosphate (FLEET) enema Place 1 enema rectally as needed. follow package directions      . tamsulosin (FLOMAX) 0.4 MG CAPS capsule Take 0.4 mg by mouth.       No current facility-administered medications for this visit.   Past Medical History  Diagnosis Date  . Chronic constipation   . Kidney stone   . Fibromyalgia   . Spleen enlarged   . Liver disease   . Hepatitis C   . Depression   . Hypertension    Past  Surgical History  Procedure Laterality Date  . Para thyhoid  2005  . Partial hysterectomy      Patient states that she had 16 years ago?1998  . Colonoscopy  5-6 years ago     Done In [place]  . Upper gastrointestinal endoscopy    . Cesarean section  1996   Allergies  Allergen Reactions  . Ampicillin     REACTION: unspecified  . Ciprofloxacin     Hives  . Sulfamethoxazole     REACTION: unspecified       Objective:   Physical Exam  Filed Vitals:   09/28/13 1529  BP: 104/68  Pulse: 72  Temp: 98.9 F (37.2 C)  Height: 5\' 10"  (1.778 m)  Weight: 232 lb 12.8 oz (105.597 kg)  Alert and oriented. Skin warm and dry. Oral mucosa is moist.   . Sclera anicteric, conjunctivae is pink. Thyroid not enlarged. No cervical lymphadenopathy. Lungs clear. Heart regular rate and rhythm.  Abdomen is soft. Bowel sounds are positive. No hepatomegaly. No abdominal masses felt. Tenderness left lower quadrant: deep and superficial.  No edema to lower extremities.        Assessment:    Left lower quadrant pain. Diverticulitis needs to be ruled out.   Hepatitis C: Successfully treated at [facility] and is followed by Dr. Wenda Overland.  Constipation which is better since starting the Linzess. Plan:    CT abdomen/pelvis with CM. Am not going to start on antibiotics until I have the CT report,. Continue the Linzess.

## 2013-10-01 ENCOUNTER — Ambulatory Visit (HOSPITAL_COMMUNITY)
Admission: RE | Admit: 2013-10-01 | Discharge: 2013-10-01 | Disposition: A | Discharge status: Home / Self Care | Payer: Medicare Other | Source: Ambulatory Visit | Attending: Internal Medicine | Admitting: Internal Medicine

## 2013-10-01 DIAGNOSIS — K7460 Unspecified cirrhosis of liver: Secondary | ICD-10-CM | POA: Insufficient documentation

## 2013-10-01 DIAGNOSIS — R1032 Left lower quadrant pain: Secondary | ICD-10-CM | POA: Insufficient documentation

## 2013-10-01 DIAGNOSIS — R110 Nausea: Secondary | ICD-10-CM | POA: Insufficient documentation

## 2013-10-01 DIAGNOSIS — K5900 Constipation, unspecified: Secondary | ICD-10-CM | POA: Insufficient documentation

## 2013-10-01 DIAGNOSIS — R161 Splenomegaly, not elsewhere classified: Secondary | ICD-10-CM | POA: Insufficient documentation

## 2013-10-01 MED ORDER — IOHEXOL 300 MG/ML  SOLN
100.0000 mL | Freq: Once | INTRAMUSCULAR | Status: AC | PRN
  Administered 2013-10-01: 100 mL via INTRAVENOUS

## 2013-10-02 ENCOUNTER — Telehealth (INDEPENDENT_AMBULATORY_CARE_PROVIDER_SITE_OTHER): Payer: Self-pay | Admitting: Internal Medicine

## 2013-10-02 NOTE — Telephone Encounter (Signed)
No answer at home. Unable to leave message.

## 2013-10-05 ENCOUNTER — Telehealth (INDEPENDENT_AMBULATORY_CARE_PROVIDER_SITE_OTHER): Payer: Self-pay | Admitting: Internal Medicine

## 2013-10-05 NOTE — Progress Notes (Signed)
Called patient to schedule her f/u apt and there was no answer.

## 2013-10-05 NOTE — Telephone Encounter (Signed)
Results given to patient. She does have nausea. There is no constipation.

## 2013-10-09 NOTE — Progress Notes (Signed)
Called and there was no answer

## 2013-10-15 NOTE — Progress Notes (Signed)
LM with husband for patient to return the call.

## 2013-10-15 NOTE — Progress Notes (Signed)
Apt has been scheduled with Dr. Laural Golden on 12/29/13.

## 2013-10-21 ENCOUNTER — Encounter (HOSPITAL_COMMUNITY): Payer: Self-pay | Admitting: Emergency Medicine

## 2013-10-21 ENCOUNTER — Emergency Department (HOSPITAL_COMMUNITY)
Admission: EM | Admit: 2013-10-21 | Discharge: 2013-10-21 | Disposition: A | Discharge status: Home / Self Care | Payer: Medicare Other | Attending: Emergency Medicine | Admitting: Emergency Medicine

## 2013-10-21 DIAGNOSIS — F3289 Other specified depressive episodes: Secondary | ICD-10-CM | POA: Insufficient documentation

## 2013-10-21 DIAGNOSIS — Z8619 Personal history of other infectious and parasitic diseases: Secondary | ICD-10-CM | POA: Insufficient documentation

## 2013-10-21 DIAGNOSIS — F329 Major depressive disorder, single episode, unspecified: Secondary | ICD-10-CM | POA: Insufficient documentation

## 2013-10-21 DIAGNOSIS — Z87442 Personal history of urinary calculi: Secondary | ICD-10-CM | POA: Insufficient documentation

## 2013-10-21 DIAGNOSIS — F17200 Nicotine dependence, unspecified, uncomplicated: Secondary | ICD-10-CM | POA: Insufficient documentation

## 2013-10-21 DIAGNOSIS — Z792 Long term (current) use of antibiotics: Secondary | ICD-10-CM | POA: Insufficient documentation

## 2013-10-21 DIAGNOSIS — K219 Gastro-esophageal reflux disease without esophagitis: Secondary | ICD-10-CM | POA: Insufficient documentation

## 2013-10-21 DIAGNOSIS — Z90711 Acquired absence of uterus with remaining cervical stump: Secondary | ICD-10-CM | POA: Insufficient documentation

## 2013-10-21 DIAGNOSIS — R109 Unspecified abdominal pain: Secondary | ICD-10-CM

## 2013-10-21 DIAGNOSIS — Z79899 Other long term (current) drug therapy: Secondary | ICD-10-CM | POA: Insufficient documentation

## 2013-10-21 DIAGNOSIS — I10 Essential (primary) hypertension: Secondary | ICD-10-CM | POA: Insufficient documentation

## 2013-10-21 DIAGNOSIS — Z8739 Personal history of other diseases of the musculoskeletal system and connective tissue: Secondary | ICD-10-CM | POA: Insufficient documentation

## 2013-10-21 LAB — URINALYSIS, ROUTINE W REFLEX MICROSCOPIC
Bilirubin Urine: NEGATIVE
Glucose, UA: NEGATIVE mg/dL
Hgb urine dipstick: NEGATIVE
Ketones, ur: NEGATIVE mg/dL
Leukocytes, UA: NEGATIVE
Nitrite: NEGATIVE
Protein, ur: NEGATIVE mg/dL
Specific Gravity, Urine: 1.015 (ref 1.005–1.030)
Urobilinogen, UA: 0.2 mg/dL (ref 0.0–1.0)
pH: 6 (ref 5.0–8.0)

## 2013-10-21 LAB — COMPREHENSIVE METABOLIC PANEL
ALT: 14 U/L (ref 0–35)
AST: 24 U/L (ref 0–37)
Albumin: 3.3 g/dL — ABNORMAL LOW (ref 3.5–5.2)
Alkaline Phosphatase: 83 U/L (ref 39–117)
BUN: 7 mg/dL (ref 6–23)
CO2: 30 mEq/L (ref 19–32)
Calcium: 9.3 mg/dL (ref 8.4–10.5)
Chloride: 94 mEq/L — ABNORMAL LOW (ref 96–112)
Creatinine, Ser: 0.56 mg/dL (ref 0.50–1.10)
GFR calc Af Amer: >90 mL/min (ref >90)
GFR calc non Af Amer: >90 mL/min (ref >90)
Glucose, Bld: 129 mg/dL — ABNORMAL HIGH (ref 70–99)
Potassium: 3.7 mEq/L (ref 3.7–5.3)
Sodium: 135 mEq/L — ABNORMAL LOW (ref 137–147)
Total Bilirubin: 0.5 mg/dL (ref 0.3–1.2)
Total Protein: 7.5 g/dL (ref 6.0–8.3)

## 2013-10-21 LAB — LIPASE, BLOOD: Lipase: 33 U/L (ref 11–59)

## 2013-10-21 LAB — CBC
HCT: 40.6 % (ref 36.0–46.0)
Hemoglobin: 13.9 g/dL (ref 12.0–15.0)
MCH: 30.5 pg (ref 26.0–34.0)
MCHC: 34.2 g/dL (ref 30.0–36.0)
MCV: 89 fL (ref 78.0–100.0)
Platelets: 85 10*3/uL — ABNORMAL LOW (ref 150–400)
RBC: 4.56 MIL/uL (ref 3.87–5.11)
RDW: 14 % (ref 11.5–15.5)
WBC: 6.8 10*3/uL (ref 4.0–10.5)

## 2013-10-21 MED ORDER — GI COCKTAIL ~~LOC~~
30.0000 mL | Freq: Once | ORAL | Status: AC
  Administered 2013-10-21: 30 mL via ORAL
  Filled 2013-10-21: qty 30

## 2013-10-21 MED ORDER — ONDANSETRON HCL 4 MG/2ML IJ SOLN
4.0000 mg | Freq: Once | INTRAMUSCULAR | Status: AC
  Administered 2013-10-21: 4 mg via INTRAVENOUS
  Filled 2013-10-21: qty 2

## 2013-10-21 MED ORDER — FAMOTIDINE 20 MG PO TABS: 20.0000 mg | ORAL_TABLET | Freq: Two times a day (BID) | ORAL | Status: DC

## 2013-10-21 MED ORDER — LANSOPRAZOLE 30 MG PO CPDR: 30.0000 mg | DELAYED_RELEASE_CAPSULE | Freq: Every day | ORAL | Status: DC

## 2013-10-21 MED ORDER — ONDANSETRON 4 MG PO TBDP: 4.0000 mg | ORAL_TABLET | Freq: Three times a day (TID) | ORAL | Status: DC | PRN

## 2013-10-21 MED ORDER — PANTOPRAZOLE SODIUM 40 MG IV SOLR
40.0000 mg | INTRAVENOUS | Status: AC
  Administered 2013-10-21: 40 mg via INTRAVENOUS
  Filled 2013-10-21: qty 40

## 2013-10-21 MED ORDER — FAMOTIDINE IN NACL 20-0.9 MG/50ML-% IV SOLN
20.0000 mg | Freq: Once | INTRAVENOUS | Status: AC
  Administered 2013-10-21: 20 mg via INTRAVENOUS
  Filled 2013-10-21: qty 50

## 2013-10-21 NOTE — ED Notes (Signed)
Pt. Reports feeling like she is on fire in her epigastric area. Pt. Reports that she feels that she cannot get a deep enough breath. O2 sat 99% respirations 18. EDP notified.

## 2013-10-21 NOTE — ED Provider Notes (Addendum)
CSN: [id number]     Arrival date & time 10/21/13  0551 History   First MD Initiated Contact with Patient 10/21/13 [phone]     Chief Complaint  Patient presents with  . Abdominal Pain   (Consider location/radiation/quality/duration/timing/severity/associated sxs/prior Treatment) HPI Comments: 58 year old female, history of "fibromyalgia", history of hepatitis C and a history of abdominal spasms which she relates to her fibromyalgia. She states that overnight this evening she has developed nausea as well as a feeling of a burning sensation in the epigastrium that radiates up into the chest and later with a bad taste in her mouth. She has never had to take anything for acid reflux before. She took a baclofen medication prior to arrival to help with the spasms, just a very good job. She has no dysuria, no diarrhea, no coughing or shortness of breath. The nausea is persistent, gradual easing off, she was able to tolerate ginger ale prior to arrival.  Patient is a 58 y.o. female presenting with abdominal pain. The history is provided by the patient.  Abdominal Pain   Past Medical History  Diagnosis Date  . Chronic constipation   . Kidney stone   . Fibromyalgia   . Spleen enlarged   . Liver disease   . Hepatitis C   . Depression   . Hypertension    Past Surgical History  Procedure Laterality Date  . Para thyhoid  2005  . Partial hysterectomy      Patient states that she had 16 years ago?1998  . Colonoscopy  5-6 years ago     Done In [place]  . Upper gastrointestinal endoscopy    . Cesarean section  1996   Family History  Problem Relation Age of Onset  . Healthy Mother   . Diabetes Father   . Heart disease Father   . Hypertension Father   . Healthy Brother   . Healthy Brother   . Healthy Daughter   . Healthy Son    History  Substance Use Topics  . Smoking status: Current Every Day Smoker -- 2.00 packs/day    Types: Cigarettes  . Smokeless tobacco: Never Used     Comment:  Patient states that she smokes 2 pack a day , has smoked since she was 47.  Marland Kitchen Alcohol Use: No   OB History   Grav Para Term Preterm Abortions TAB SAB Ect Mult Living                 Review of Systems  Gastrointestinal: Positive for abdominal pain.  All other systems reviewed and are negative.    Allergies  Ampicillin; Ciprofloxacin; and Sulfamethoxazole  Home Medications   Current Outpatient Rx  Name  Route  Sig  Dispense  Refill  . baclofen (LIORESAL) 20 MG tablet   Oral   Take 20 mg by mouth 3 (three) times daily.         . bisacodyl (DULCOLAX) 10 MG suppository   Rectal   Place 1 suppository (10 mg total) rectally daily as needed for moderate constipation.   14 suppository   0   . budesonide-formoterol (SYMBICORT) 160-4.5 MCG/ACT inhaler   Inhalation   Inhale 1 puff into the lungs 2 (two) times daily.         . ciprofloxacin (CIPRO) 500 MG tablet   Oral   Take 500 mg by mouth 2 (two) times daily.         . clonazePAM (KLONOPIN) 1 MG tablet   Oral  Take 1 mg by mouth at bedtime as needed for anxiety.         . famotidine (PEPCID) 20 MG tablet   Oral   Take 1 tablet (20 mg total) by mouth 2 (two) times daily.   30 tablet   0   . fentaNYL (DURAGESIC - DOSED MCG/HR) 50 MCG/HR   Transdermal   Place 50 mcg onto the skin every 3 (three) days.         . Gabapentin Enacarbil ER 300 MG TBCR   Oral   Take by mouth 3 (three) times daily.         . hyoscyamine (LEVSIN SL) 0.125 MG SL tablet   Sublingual   Place 1 tablet (0.125 mg total) under the tongue every 6 (six) hours as needed.   60 tablet   1   . lansoprazole (PREVACID) 30 MG capsule   Oral   Take 1 capsule (30 mg total) by mouth daily at 12 noon.   30 capsule   0   . Linaclotide (LINZESS) 290 MCG CAPS capsule   Oral   Take 1 capsule (290 mcg total) by mouth daily.   30 capsule   5   . LISINOPRIL-HYDROCHLOROTHIAZIDE PO   Oral   Take by mouth. Patient takes 20/25 mg daily          . Naproxen Sodium (ALEVE PO)   Oral   Take by mouth as needed.         . ondansetron (ZOFRAN ODT) 4 MG disintegrating tablet   Oral   Take 1 tablet (4 mg total) by mouth every 8 (eight) hours as needed for nausea.   10 tablet   0   . ondansetron (ZOFRAN-ODT) 4 MG disintegrating tablet   Oral   Take 4 mg by mouth every 8 (eight) hours as needed for nausea or vomiting.         Marland Kitchen oxyCODONE (OXY IR/ROXICODONE) 5 MG immediate release tablet   Oral   Take 5 mg by mouth as needed for severe pain.         . Simethicone (GAS-X MAXIMUM STRENGTH PO)   Oral   Take by mouth as needed.         . sodium phosphate (FLEET) enema   Rectal   Place 1 enema rectally as needed. follow package directions         . tamsulosin (FLOMAX) 0.4 MG CAPS capsule   Oral   Take 0.4 mg by mouth.          BP 147/65  Pulse 72  Temp(Src) 98.7 F (37.1 C) (Oral)  Resp 18  Ht 5\' 10"  (1.778 m)  Wt 233 lb (105.688 kg)  BMI 33.43 kg/m2  SpO2 99% Physical Exam  Nursing note and vitals reviewed. Constitutional: She appears well-developed and well-nourished. No distress.  HENT:  Head: Normocephalic and atraumatic.  Mouth/Throat: Oropharynx is clear and moist. No oropharyngeal exudate.  Eyes: Conjunctivae and EOM are normal. Pupils are equal, round, and reactive to light. Right eye exhibits no discharge. Left eye exhibits no discharge. No scleral icterus.  Neck: Normal range of motion. Neck supple. No JVD present. No thyromegaly present.  Cardiovascular: Normal rate, regular rhythm, normal heart sounds and intact distal pulses.  Exam reveals no gallop and no friction rub.   No murmur heard. Pulmonary/Chest: Effort normal and breath sounds normal. No respiratory distress. She has no wheezes. She has no rales.  Abdominal: Soft. Bowel sounds are normal. She  exhibits no distension and no mass. There is tenderness (minimal epigastric tenderness without guarding or masses).  Musculoskeletal: Normal  range of motion. She exhibits no edema and no tenderness.  Lymphadenopathy:    She has no cervical adenopathy.  Neurological: She is alert. Coordination normal.  Skin: Skin is warm and dry. No rash noted. No erythema.  Psychiatric: She has a normal mood and affect. Her behavior is normal.    ED Course  Procedures (including critical care time) Labs Review Labs Reviewed  COMPREHENSIVE METABOLIC PANEL - Abnormal; Notable for the following:    Sodium 135 (*)    Chloride 94 (*)    Glucose, Bld 129 (*)    Albumin 3.3 (*)    All other components within normal limits  CBC - Abnormal; Notable for the following:    Platelets 85 (*)    All other components within normal limits  LIPASE, BLOOD  URINALYSIS, ROUTINE W REFLEX MICROSCOPIC   Imaging Review No results found.  EKG Interpretation    Date/Time:  Wednesday October 21 2013 07:09:46 EST Ventricular Rate:  61 PR Interval:  148 QRS Duration: 92 QT Interval:  414 QTC Calculation: 416 R Axis:   44 Text Interpretation:  Normal sinus rhythm Normal ECG When compared with ECG of 22-Oct-2008 05:30, Premature ventricular complexes are no longer Present QT has shortened Confirmed by Zooey Schreurs  MD, Najae Filsaime ([id number]) on 10/21/2013 7:26:39 AM            MDM   1. Abdominal pain   2. Acid reflux    The patient is awake, alert, has a rather normal exam except for some mild epigastric tenderness, vital signs are normal, the patient appears to be in good spirits, GI cocktail, labs to rule out other sources such as pancreatitis, hepatitis, cholecystitis and urinary infection.  0700 - pt reexamined - ongoing mild ttp in the epigastrium - states that GI cocktail took pain away but after 30 minute it came back and now has ongoing burning in the upper ab dna chest into the throat.  ECG to ensure not cardiac, pepcid and protonix.    CBC shows thrombocytopenia - no leukocytosis present.  No anemia.  CMP and lipase normal.    ECG without acute  findings  Johnna Acosta, MD 10/21/13 [phone]  Meds given in ED:  Medications  famotidine (PEPCID) IVPB 20 mg (20 mg Intravenous New Bag/Given 10/21/13 0725)  gi cocktail (Maalox,Lidocaine,Donnatal) (30 mLs Oral Given 10/21/13 0613)  ondansetron (ZOFRAN) injection 4 mg (4 mg Intravenous Given 10/21/13 0614)  pantoprazole (PROTONIX) injection 40 mg (40 mg Intravenous Given 10/21/13 0724)    New Prescriptions   FAMOTIDINE (PEPCID) 20 MG TABLET    Take 1 tablet (20 mg total) by mouth 2 (two) times daily.   LANSOPRAZOLE (PREVACID) 30 MG CAPSULE    Take 1 capsule (30 mg total) by mouth daily at 12 noon.   ONDANSETRON (ZOFRAN ODT) 4 MG DISINTEGRATING TABLET    Take 1 tablet (4 mg total) by mouth every 8 (eight) hours as needed for nausea.      Johnna Acosta, MD 10/21/13 [phone]

## 2013-10-21 NOTE — ED Notes (Signed)
Patient c/o epigastric spasms since 0215.  Patient states she took a Baclofen with no relief and called EMS.  Patient sates she is out of Zofran.  EMS gave Zofran 4mg  IV for nausea en route.

## 2013-10-21 NOTE — Discharge Instructions (Signed)
Your testing is all normal except for low platelets (a type of blood cell) however this was low 5 years ago when you were evaluated with blood work.  Keep taking the pepcid and prevacid - use the over the counter mediines such as tums or rolaids.  Please call your doctor for a followup appointment within 24-48 hours. When you talk to your doctor please let them know that you were seen in the emergency department and have them acquire all of your records so that they can discuss the findings with you and formulate a treatment plan to fully care for your new and ongoing problems.

## 2013-12-29 ENCOUNTER — Ambulatory Visit (INDEPENDENT_AMBULATORY_CARE_PROVIDER_SITE_OTHER): Payer: Medicare Other | Admitting: Internal Medicine

## 2014-01-05 ENCOUNTER — Ambulatory Visit (INDEPENDENT_AMBULATORY_CARE_PROVIDER_SITE_OTHER): Payer: Medicare Other | Admitting: Internal Medicine

## 2014-03-01 ENCOUNTER — Encounter (INDEPENDENT_AMBULATORY_CARE_PROVIDER_SITE_OTHER): Payer: Self-pay

## 2014-03-15 ENCOUNTER — Encounter (INDEPENDENT_AMBULATORY_CARE_PROVIDER_SITE_OTHER): Payer: Self-pay | Admitting: Internal Medicine

## 2014-03-15 ENCOUNTER — Ambulatory Visit (INDEPENDENT_AMBULATORY_CARE_PROVIDER_SITE_OTHER): Payer: Medicare Other | Admitting: Internal Medicine

## 2014-03-15 ENCOUNTER — Encounter (INDEPENDENT_AMBULATORY_CARE_PROVIDER_SITE_OTHER): Payer: Self-pay | Admitting: *Deleted

## 2014-03-15 VITALS — BP 130/80 | HR 78 | Temp 97.9°F | Resp 18 | Ht 70.0 in | Wt 223.9 lb

## 2014-03-15 DIAGNOSIS — K7469 Other cirrhosis of liver: Secondary | ICD-10-CM

## 2014-03-15 DIAGNOSIS — R109 Unspecified abdominal pain: Secondary | ICD-10-CM

## 2014-03-15 DIAGNOSIS — K7460 Unspecified cirrhosis of liver: Secondary | ICD-10-CM

## 2014-03-15 LAB — PROTIME-INR
INR: 0.93 (ref <1.50)
Prothrombin Time: 12.4 seconds (ref 11.6–15.2)

## 2014-03-15 MED ORDER — DICYCLOMINE HCL 10 MG PO CAPS: 10.0000 mg | ORAL_CAPSULE | Freq: Three times a day (TID) | ORAL | Status: DC | PRN

## 2014-03-15 NOTE — Patient Instructions (Signed)
Physician will call with the results of CT scan and blood work when completed

## 2014-03-15 NOTE — Progress Notes (Signed)
Presenting complaint;  Left-sided abdominal pain.  Subjective:  Patient is 58 year old Caucasian female who is here for scheduled visit accompanied by her mother. She has history of cirrhosis and hepatitis C which was successfully treated at [facility] in 2011. She was last seen on 09/28/2013 for left lower quadrant abdominal pain. She had abdominal pelvic CT no abnormality to account for her pain. This study it did show changes of cirrhosis Plenum megaly DJD changes at T11 and T12 for spinal canal stenosis. She also has history of constipation and is on Linaclotide. She now presents with unrelenting pain for the last one month. She points to left upper quadrant at site of pain. Also complains of bloating. She denies nausea vomiting fever or chills. However she has profuse sweating at night. Previously pain was located in left lower quadrant and was intermittent. She gets some relief with bowel movement the pain never goes away completely. She states her stools are flat. She denies melena or rectal bleeding. She also gives a string of hemorrhoids and is using preparation H. for swelling and irritation but she denies hematochezia. She continues to complain of chronic back pain as well as lower extremity pain. She says she is not able to exercise on account of back pain and lower extremity pain. She does not have good appetite. He tries to eat fiber rich foods though. She has lost 9 pounds since his last visit of 09/28/2013. She says she had blood work by Dr. Manuella Ghazi last week and we have requested these results. She denies dysuria or hematuria. Patient is very pleased to tell me that she quit cigarette smoking on 10/21/2013.   Current Medications: Outpatient Encounter Prescriptions as of 03/15/2014  Medication Sig  . bisacodyl (DULCOLAX) 10 MG suppository Place 1 suppository (10 mg total) rectally daily as needed for moderate constipation.  . budesonide-formoterol (SYMBICORT) 160-4.5 MCG/ACT inhaler Inhale  1 puff into the lungs 2 (two) times daily.  . citalopram (CELEXA) 20 MG tablet Take 20 mg by mouth daily.   . fentaNYL (DURAGESIC - DOSED MCG/HR) 50 MCG/HR Place 50 mcg onto the skin every 3 (three) days.  . Gabapentin Enacarbil ER 300 MG TBCR Take by mouth 3 (three) times daily.  . Linaclotide (LINZESS) 290 MCG CAPS capsule Take 1 capsule (290 mcg total) by mouth daily.  Marland Kitchen LISINOPRIL-HYDROCHLOROTHIAZIDE PO Take by mouth. Patient takes 20/25 mg daily  . methocarbamol (ROBAXIN) 750 MG tablet Take 750 mg by mouth every 6 (six) hours as needed.   . Naproxen Sodium (ALEVE PO) Take by mouth as needed.  Marland Kitchen oxyCODONE (OXY IR/ROXICODONE) 5 MG immediate release tablet Take 5 mg by mouth as needed for severe pain.  . Simethicone (GAS-X MAXIMUM STRENGTH PO) Take by mouth as needed.  . sodium phosphate (FLEET) enema Place 1 enema rectally as needed. follow package directions  . dicyclomine (BENTYL) 10 MG capsule Take 1 capsule (10 mg total) by mouth 3 (three) times daily as needed for spasms.  . [DISCONTINUED] baclofen (LIORESAL) 20 MG tablet Take 20 mg by mouth 3 (three) times daily.  . [DISCONTINUED] ciprofloxacin (CIPRO) 500 MG tablet Take 500 mg by mouth 2 (two) times daily.  . [DISCONTINUED] clonazePAM (KLONOPIN) 1 MG tablet Take 1 mg by mouth at bedtime as needed for anxiety.  . [DISCONTINUED] famotidine (PEPCID) 20 MG tablet Take 1 tablet (20 mg total) by mouth 2 (two) times daily.  . [DISCONTINUED] hyoscyamine (LEVSIN SL) 0.125 MG SL tablet Place 1 tablet (0.125 mg total) under the  tongue every 6 (six) hours as needed.  . [DISCONTINUED] lansoprazole (PREVACID) 30 MG capsule Take 1 capsule (30 mg total) by mouth daily at 12 noon.  . [DISCONTINUED] ondansetron (ZOFRAN ODT) 4 MG disintegrating tablet Take 1 tablet (4 mg total) by mouth every 8 (eight) hours as needed for nausea.  . [DISCONTINUED] ondansetron (ZOFRAN-ODT) 4 MG disintegrating tablet Take 4 mg by mouth every 8 (eight) hours as needed for  nausea or vomiting.  . [DISCONTINUED] tamsulosin (FLOMAX) 0.4 MG CAPS capsule Take 0.4 mg by mouth.   Past Medical History  Diagnosis Date  . Chronic constipation   . Kidney stone   . Fibromyalgia   .    .  cirrhosis possibly due to history of hep C and fatty liver; she also gives history of excessive alcohol intake; she quit drinking over 5 years ago.   . Hepatitis C; history of hepatitis C genotype 2 ; she was treated at [facility] in 2011 has remained an SVR    . Depression   . Hypertension    Past Surgical History  Procedure Laterality Date  . Para thyhoid  2005  . Partial hysterectomy      Patient states that she had 16 years ago?1998  . Colonoscopy  5-6 years ago     Done In [place]  . Upper gastrointestinal endoscopy    . Cesarean section  1996      Objective: Blood pressure 130/80, pulse 78, temperature 97.9 F (36.6 C), temperature source Oral, resp. rate 18, height 5\' 10"  (1.778 m), weight 223 lb 14.4 oz (101.56 kg). Patient is alert and in no acute distress. Asterixis absent. Conjunctiva is pink. Sclera is nonicteric Oropharyngeal mucosa is normal. No neck masses or thyromegaly noted. Cardiac exam with regular rhythm normal S1 and S2. Faint systolic ejection murmur noted at LLSB. Lungs are clear to auscultation. Abdomen is full. Bowel sounds are normal. On palpation abdomen is soft with mild to moderate tenderness at LUQ; spleen is not palpable; liver edge is firm below RCM. No LE edema or clubbing noted.  Labs/studies Results: Lab data from 03/12/2014 WBC 7.0, H&H 14.9 and 44.1. Platelet count 135K Bilirubin 0.5, AP 74, AST 36,  ALT 27, serum albumin 4.1. BUN 12, creatinine 0.83 Serum sodium 138, potassium 3.5, chloride 98, CO2 29 Serum calcium 10. TSH 21.23    Assessment:  #1. Left upper quadrant abdominal pain. She has unrelenting pain she does not have any other symptoms to suggest diverticulitis or urolithiasis. Suspect this pain may be due to  constipation predominant IBS but given severity of her pain further workup is warranted. She has undergone 3 colonoscopies in the past most recently in June 2008 revealing hyperplastic polyps. #2. Cirrhosis. Cirrhosis felt to be multifactorial. Hep C was eradicated over 4 years ago.. She has history of excessive alcohol use in the past and now she possibly has fatty liver as well. Liver disease is compensated. She is due for screening for [place] #3. Elevated TSH. She will followup with Dr. Manuella Ghazi regarding this.    Plan:  Dicyclomine 10 mg po tid prn. A/P CT with contrast. AFP and INR. I will contact patient with results of these studies and determine timing of her next office visit to

## 2014-03-16 LAB — AFP TUMOR MARKER: AFP-Tumor Marker: 4.3 ng/mL (ref 0.0–8.0)

## 2014-03-19 ENCOUNTER — Encounter (INDEPENDENT_AMBULATORY_CARE_PROVIDER_SITE_OTHER): Payer: Self-pay

## 2014-03-25 ENCOUNTER — Ambulatory Visit (HOSPITAL_COMMUNITY): Admission: RE | Admit: 2014-03-25 | Payer: Medicare Other | Source: Ambulatory Visit

## 2014-03-30 ENCOUNTER — Ambulatory Visit (HOSPITAL_COMMUNITY)
Admission: RE | Admit: 2014-03-30 | Discharge: 2014-03-30 | Disposition: A | Discharge status: Home / Self Care | Payer: Medicare Other | Source: Ambulatory Visit | Attending: Internal Medicine | Admitting: Internal Medicine

## 2014-03-30 DIAGNOSIS — K7460 Unspecified cirrhosis of liver: Secondary | ICD-10-CM | POA: Diagnosis not present

## 2014-03-30 DIAGNOSIS — R161 Splenomegaly, not elsewhere classified: Secondary | ICD-10-CM | POA: Diagnosis not present

## 2014-03-30 DIAGNOSIS — Z90710 Acquired absence of both cervix and uterus: Secondary | ICD-10-CM | POA: Diagnosis not present

## 2014-03-30 DIAGNOSIS — K766 Portal hypertension: Secondary | ICD-10-CM | POA: Diagnosis not present

## 2014-03-30 DIAGNOSIS — R109 Unspecified abdominal pain: Secondary | ICD-10-CM

## 2014-03-30 DIAGNOSIS — D3500 Benign neoplasm of unspecified adrenal gland: Secondary | ICD-10-CM | POA: Diagnosis not present

## 2014-03-30 DIAGNOSIS — N200 Calculus of kidney: Secondary | ICD-10-CM | POA: Insufficient documentation

## 2014-03-30 MED ORDER — IOHEXOL 300 MG/ML  SOLN
100.0000 mL | Freq: Once | INTRAMUSCULAR | Status: AC | PRN
  Administered 2014-03-30: 100 mL via INTRAVENOUS

## 2014-07-26 ENCOUNTER — Other Ambulatory Visit (INDEPENDENT_AMBULATORY_CARE_PROVIDER_SITE_OTHER): Payer: Self-pay | Admitting: Internal Medicine

## 2014-11-04 ENCOUNTER — Encounter (INDEPENDENT_AMBULATORY_CARE_PROVIDER_SITE_OTHER): Payer: Self-pay | Admitting: *Deleted

## 2014-12-14 ENCOUNTER — Ambulatory Visit (INDEPENDENT_AMBULATORY_CARE_PROVIDER_SITE_OTHER): Payer: Medicare Other | Admitting: Internal Medicine

## 2015-02-12 ENCOUNTER — Observation Stay (HOSPITAL_COMMUNITY)
Admission: EM | Admit: 2015-02-12 | Discharge: 2015-02-14 | Disposition: A | Discharge status: Home / Self Care | Payer: Medicare Other | Attending: Cardiovascular Disease | Admitting: Cardiovascular Disease

## 2015-02-12 ENCOUNTER — Emergency Department (HOSPITAL_COMMUNITY): Payer: Medicare Other

## 2015-02-12 ENCOUNTER — Encounter (HOSPITAL_COMMUNITY): Payer: Self-pay | Admitting: *Deleted

## 2015-02-12 DIAGNOSIS — R079 Chest pain, unspecified: Secondary | ICD-10-CM | POA: Diagnosis not present

## 2015-02-12 DIAGNOSIS — Z880 Allergy status to penicillin: Secondary | ICD-10-CM | POA: Insufficient documentation

## 2015-02-12 DIAGNOSIS — Z882 Allergy status to sulfonamides status: Secondary | ICD-10-CM | POA: Insufficient documentation

## 2015-02-12 DIAGNOSIS — Z8249 Family history of ischemic heart disease and other diseases of the circulatory system: Secondary | ICD-10-CM | POA: Diagnosis not present

## 2015-02-12 DIAGNOSIS — B1920 Unspecified viral hepatitis C without hepatic coma: Secondary | ICD-10-CM | POA: Diagnosis present

## 2015-02-12 DIAGNOSIS — K219 Gastro-esophageal reflux disease without esophagitis: Secondary | ICD-10-CM | POA: Insufficient documentation

## 2015-02-12 DIAGNOSIS — M5136 Other intervertebral disc degeneration, lumbar region: Secondary | ICD-10-CM | POA: Diagnosis not present

## 2015-02-12 DIAGNOSIS — Z833 Family history of diabetes mellitus: Secondary | ICD-10-CM | POA: Diagnosis not present

## 2015-02-12 DIAGNOSIS — E785 Hyperlipidemia, unspecified: Secondary | ICD-10-CM | POA: Diagnosis present

## 2015-02-12 DIAGNOSIS — Z6829 Body mass index (BMI) 29.0-29.9, adult: Secondary | ICD-10-CM | POA: Diagnosis not present

## 2015-02-12 DIAGNOSIS — E663 Overweight: Secondary | ICD-10-CM | POA: Diagnosis not present

## 2015-02-12 DIAGNOSIS — F329 Major depressive disorder, single episode, unspecified: Secondary | ICD-10-CM | POA: Diagnosis not present

## 2015-02-12 DIAGNOSIS — F17210 Nicotine dependence, cigarettes, uncomplicated: Secondary | ICD-10-CM | POA: Insufficient documentation

## 2015-02-12 DIAGNOSIS — K7460 Unspecified cirrhosis of liver: Secondary | ICD-10-CM | POA: Diagnosis present

## 2015-02-12 DIAGNOSIS — I2510 Atherosclerotic heart disease of native coronary artery without angina pectoris: Secondary | ICD-10-CM | POA: Diagnosis present

## 2015-02-12 DIAGNOSIS — Z86010 Personal history of colonic polyps: Secondary | ICD-10-CM | POA: Diagnosis not present

## 2015-02-12 DIAGNOSIS — Z881 Allergy status to other antibiotic agents status: Secondary | ICD-10-CM | POA: Insufficient documentation

## 2015-02-12 DIAGNOSIS — Z7901 Long term (current) use of anticoagulants: Secondary | ICD-10-CM | POA: Diagnosis not present

## 2015-02-12 DIAGNOSIS — M797 Fibromyalgia: Secondary | ICD-10-CM | POA: Insufficient documentation

## 2015-02-12 DIAGNOSIS — I10 Essential (primary) hypertension: Secondary | ICD-10-CM | POA: Diagnosis present

## 2015-02-12 DIAGNOSIS — Z7982 Long term (current) use of aspirin: Secondary | ICD-10-CM | POA: Insufficient documentation

## 2015-02-12 HISTORY — DX: Chest pain, unspecified: R07.9

## 2015-02-12 HISTORY — DX: Other intervertebral disc degeneration, lumbar region without mention of lumbar back pain or lower extremity pain: M51.369

## 2015-02-12 HISTORY — DX: Thrombocytopenia, unspecified: D69.6

## 2015-02-12 HISTORY — DX: Other intervertebral disc degeneration, lumbar region: M51.36

## 2015-02-12 LAB — CBC
HCT: 42.2 % (ref 36.0–46.0)
Hemoglobin: 14.1 g/dL (ref 12.0–15.0)
MCH: 30.4 pg (ref 26.0–34.0)
MCHC: 33.4 g/dL (ref 30.0–36.0)
MCV: 90.9 fL (ref 78.0–100.0)
Platelets: 115 10*3/uL — ABNORMAL LOW (ref 150–400)
RBC: 4.64 MIL/uL (ref 3.87–5.11)
RDW: 13.8 % (ref 11.5–15.5)
WBC: 7.1 10*3/uL (ref 4.0–10.5)

## 2015-02-12 LAB — BASIC METABOLIC PANEL
Anion gap: 7 (ref 5–15)
BUN: 11 mg/dL (ref 6–20)
CO2: 33 mmol/L — ABNORMAL HIGH (ref 22–32)
Calcium: 9.5 mg/dL (ref 8.9–10.3)
Chloride: 99 mmol/L — ABNORMAL LOW (ref 101–111)
Creatinine, Ser: 0.69 mg/dL (ref 0.44–1.00)
GFR calc Af Amer: >60 mL/min (ref >60)
GFR calc non Af Amer: >60 mL/min (ref >60)
Glucose, Bld: 97 mg/dL (ref 65–99)
Potassium: 3.8 mmol/L (ref 3.5–5.1)
Sodium: 139 mmol/L (ref 135–145)

## 2015-02-12 LAB — TROPONIN I
Troponin I: <0.03 ng/mL (ref <0.031)
Troponin I: <0.03 ng/mL (ref <0.031)

## 2015-02-12 MED ORDER — NITROGLYCERIN 2 % TD OINT
1.0000 [in_us] | TOPICAL_OINTMENT | Freq: Once | TRANSDERMAL | Status: AC
  Administered 2015-02-12: 1 [in_us] via TOPICAL
  Filled 2015-02-12: qty 1

## 2015-02-12 MED ORDER — NITROGLYCERIN 0.4 MG SL SUBL
0.4000 mg | SUBLINGUAL_TABLET | SUBLINGUAL | Status: AC | PRN
  Administered 2015-02-12 – 2015-02-13 (×3): 0.4 mg via SUBLINGUAL
  Filled 2015-02-12 (×2): qty 1

## 2015-02-12 MED ORDER — GABAPENTIN 400 MG PO CAPS
1200.0000 mg | ORAL_CAPSULE | Freq: Once | ORAL | Status: AC
  Administered 2015-02-12: 1200 mg via ORAL
  Filled 2015-02-12: qty 3

## 2015-02-12 MED ORDER — ASPIRIN 81 MG PO CHEW
243.0000 mg | CHEWABLE_TABLET | Freq: Once | ORAL | Status: AC
  Administered 2015-02-12: 243 mg via ORAL
  Filled 2015-02-12: qty 3

## 2015-02-12 NOTE — ED Provider Notes (Signed)
CSN: [id number]     Arrival date & time 02/12/15  1659 History   First MD Initiated Contact with Patient 02/12/15 1831     Chief Complaint  Patient presents with  . Chest Pain    Patient is a 59 y.o. female presenting with chest pain. The history is provided by the patient.  Chest Pain Pain location:  L chest Pain quality: pressure   Pain radiates to:  L arm and neck Pain severity:  Moderate Onset quality:  Gradual Duration:  5 minutes Timing:  Intermittent Progression:  Improving Chronicity:  New Relieved by:  Nothing Worsened by:  Nothing tried Associated symptoms: nausea and weakness   Associated symptoms: no fever, no shortness of breath and not vomiting   Risk factors: coronary artery disease, hypertension and smoking   PT reports episodes of left CP that radiates into left arm/neck She reports she has had multiple episodes/day over past 2 weeks She also reports weakness and nausea No pleuritic CP She reports h/o CAD with stent placement She denies h/o VTE She reports she has f/u with cardiology scheduled on 02/28/15  Past Medical History  Diagnosis Date  . Chronic constipation   . Kidney stone   . Fibromyalgia   . Spleen enlarged   . Liver disease   . Hepatitis C   . Depression   . Hypertension   . DDD (degenerative disc disease), lumbar    Past Surgical History  Procedure Laterality Date  . Para thyhoid  2005  . Partial hysterectomy      Patient states that she had 16 years ago?1998  . Colonoscopy  5-6 years ago     Done In [place]  . Upper gastrointestinal endoscopy    . Cesarean section  1996   Family History  Problem Relation Age of Onset  . Healthy Mother   . Diabetes Father   . Heart disease Father   . Hypertension Father   . Healthy Brother   . Healthy Brother   . Healthy Daughter   . Healthy Son    History  Substance Use Topics  . Smoking status: Current Every Day Smoker -- 2.00 packs/day    Types: Cigarettes  . Smokeless tobacco:  Never Used     Comment: Patient states that she smokes 2 pack a day , has smoked since she was 79.  Marland Kitchen Alcohol Use: No   OB History    No data available     Review of Systems  Constitutional: Negative for fever.  Respiratory: Negative for shortness of breath.   Cardiovascular: Positive for chest pain.  Gastrointestinal: Positive for nausea. Negative for vomiting.  Neurological: Positive for weakness. Negative for syncope.  All other systems reviewed and are negative.     Allergies  Ampicillin; Ciprofloxacin; and Sulfamethoxazole  Home Medications   Prior to Admission medications   Medication Sig Start Date End Date Taking? Authorizing Provider  aspirin EC 81 MG tablet Take 81 mg by mouth daily.   Yes Historical Provider, MD  dicyclomine (BENTYL) 10 MG capsule Take 1 capsule (10 mg total) by mouth 3 (three) times daily as needed for spasms. 03/15/14  Yes Rogene Houston, MD  fentaNYL (DURAGESIC - DOSED MCG/HR) 50 MCG/HR Place 50 mcg onto the skin every 3 (three) days.   Yes Historical Provider, MD  gabapentin (NEURONTIN) 300 MG capsule Take 1,200 mg by mouth 3 (three) times daily.   Yes Historical Provider, MD  levothyroxine (SYNTHROID, LEVOTHROID) 75 MCG tablet Take 75  mcg by mouth daily before breakfast.   Yes Historical Provider, MD  LINZESS 290 MCG CAPS capsule TAKE ONE CAPSULE BY MOUTH ONCE DAILY Patient taking differently: TAKE ONE CAPSULE BY MOUTH ONCE DAILY AS NEEDED FOR IBS/SYMPTOMS 07/27/14  Yes Butch Penny, NP  lisinopril-hydrochlorothiazide (PRINZIDE,ZESTORETIC) 20-25 MG per tablet Take 1 tablet by mouth daily.   Yes Historical Provider, MD  methocarbamol (ROBAXIN) 750 MG tablet Take 325-750 mg by mouth every 6 (six) hours as needed for muscle spasms.  03/04/14  Yes Historical Provider, MD  metoprolol tartrate (LOPRESSOR) 25 MG tablet Take 25 mg by mouth 2 (two) times daily.   Yes Historical Provider, MD  oxyCODONE (OXY IR/ROXICODONE) 5 MG immediate release tablet Take  5-10 mg by mouth every 8 (eight) hours as needed for moderate pain or severe pain.    Yes Historical Provider, MD  bisacodyl (DULCOLAX) 10 MG suppository Place 1 suppository (10 mg total) rectally daily as needed for moderate constipation. Patient not taking: Reported on 02/12/2015 08/03/13   Rogene Houston, MD  budesonide-formoterol (SYMBICORT) 160-4.5 MCG/ACT inhaler Inhale 1 puff into the lungs 2 (two) times daily.    Historical Provider, MD  citalopram (CELEXA) 20 MG tablet Take 20 mg by mouth daily.  01/22/14   Historical Provider, MD  Naproxen Sodium (ALEVE PO) Take by mouth as needed.    Historical Provider, MD  Simethicone (GAS-X MAXIMUM STRENGTH PO) Take by mouth as needed.    Historical Provider, MD  sodium phosphate (FLEET) enema Place 1 enema rectally as needed. follow package directions    Historical Provider, MD   BP 146/85 mmHg  Pulse 57  Temp(Src) 98.1 F (36.7 C) (Oral)  Resp 16  Ht 5\' 11"  (1.803 m)  Wt 218 lb (98.884 kg)  BMI 30.42 kg/m2  SpO2 100% Physical Exam CONSTITUTIONAL: Well developed/well nourished HEAD: Normocephalic/atraumatic EYES: EOMI/PERRL ENMT: Mucous membranes moist NECK: supple no meningeal signs SPINE/BACK:entire spine nontender CV: S1/S2 noted, no murmurs/rubs/gallops noted LUNGS: Lungs are clear to auscultation bilaterally, no apparent distress ABDOMEN: soft, nontender, no rebound or guarding, bowel sounds noted throughout abdomen GU:no cva tenderness NEURO: Pt is awake/alert/appropriate, moves all extremitiesx4.  No facial droop.   EXTREMITIES: pulses normal/equal, full ROM, no LE edema or tenderness SKIN: warm, color normal PSYCH: no abnormalities of mood noted, alert and oriented to situation   ED Course  Procedures  7:17 PM Per records, pt had stent to RCA in 2010 ASA/NTG ordered Will need admission 7:44 PM D/w dr Elgie Collard on call cardiology [phone] Repeat troponin If negative will admit cards tele If positive will admit to  SDU Will call him back 9:10 PM Repeat troponin negative Pt is CP free Will admit to [place] D/w dr Marlowe Sax, will accept in transfer Pt feels improved and eating dinner  Labs Review Labs Reviewed  CBC - Abnormal; Notable for the following:    Platelets 115 (*)    All other components within normal limits  BASIC METABOLIC PANEL - Abnormal; Notable for the following:    Chloride 99 (*)    CO2 33 (*)    All other components within normal limits  TROPONIN I    Imaging Review Dg Chest 2 View  02/12/2015   CLINICAL DATA:  Chest pain times 2-1/2 weeks  EXAM: CHEST  2 VIEW  COMPARISON:  None.  FINDINGS: Lungs are clear.  No pleural effusion or pneumothorax.  The heart is normal size.  Visualized osseous structures are within normal limits.  IMPRESSION:  No evidence of acute cardiopulmonary disease.   Electronically Signed   By: Julian Hy M.D.   On: 02/12/2015 18:25     EKG Interpretation   Date/Time:  Saturday Feb 12 2015 17:07:38 EDT Ventricular Rate:  58 PR Interval:  118 QRS Duration: 86 QT Interval:  404 QTC Calculation: 396 R Axis:   28 Text Interpretation:  Sinus bradycardia Otherwise normal ECG No  significant change since last tracing Confirmed by Christy Gentles  MD, Sonu Kruckenberg  [phone]) on 02/12/2015 5:24:38 PM     Medications  nitroGLYCERIN (NITROSTAT) SL tablet 0.4 mg (0.4 mg Sublingual Given 02/12/15 2044)  aspirin chewable tablet 243 mg (243 mg Oral Given 02/12/15 1902)    MDM   Final diagnoses:  Chest pain, rule out acute myocardial infarction    Nursing notes including past medical history and social history reviewed and considered in documentation xrays/imaging reviewed by myself and considered during evaluation Labs/vital reviewed myself and considered during evaluation     Ripley Fraise, MD 02/12/15 2110

## 2015-02-12 NOTE — ED Notes (Signed)
Patient denies chest pain or pressure at this time. States that the nitro made it go away.

## 2015-02-12 NOTE — ED Notes (Signed)
Pt states intermittent pain to center chest, left side of neck and left arm. Pt had EKG at PCP office and was referred to cardiologist but appt isn't until June 6th. Pt states pain became worse once again worse today. States pain usually lasts 2-3 minutes. Pain is described as pressure. Pt states PCP added metoprolol and states pain has eased up some since then.

## 2015-02-13 ENCOUNTER — Other Ambulatory Visit: Payer: Self-pay

## 2015-02-13 DIAGNOSIS — R079 Chest pain, unspecified: Secondary | ICD-10-CM | POA: Diagnosis not present

## 2015-02-13 DIAGNOSIS — I209 Angina pectoris, unspecified: Secondary | ICD-10-CM

## 2015-02-13 DIAGNOSIS — I2510 Atherosclerotic heart disease of native coronary artery without angina pectoris: Secondary | ICD-10-CM | POA: Diagnosis not present

## 2015-02-13 DIAGNOSIS — E785 Hyperlipidemia, unspecified: Secondary | ICD-10-CM | POA: Diagnosis not present

## 2015-02-13 DIAGNOSIS — I10 Essential (primary) hypertension: Secondary | ICD-10-CM | POA: Diagnosis not present

## 2015-02-13 LAB — CBC
HCT: 40.4 % (ref 36.0–46.0)
Hemoglobin: 13.8 g/dL (ref 12.0–15.0)
MCH: 30.4 pg (ref 26.0–34.0)
MCHC: 34.2 g/dL (ref 30.0–36.0)
MCV: 89 fL (ref 78.0–100.0)
Platelets: 96 10*3/uL — ABNORMAL LOW (ref 150–400)
RBC: 4.54 MIL/uL (ref 3.87–5.11)
RDW: 13.8 % (ref 11.5–15.5)
WBC: 5.6 10*3/uL (ref 4.0–10.5)

## 2015-02-13 LAB — CREATININE, SERUM
Creatinine, Ser: 0.6 mg/dL (ref 0.44–1.00)
GFR calc Af Amer: >60 mL/min (ref >60)
GFR calc non Af Amer: >60 mL/min (ref >60)

## 2015-02-13 MED ORDER — HEPARIN SODIUM (PORCINE) 5000 UNIT/ML IJ SOLN
5000.0000 [IU] | Freq: Three times a day (TID) | INTRAMUSCULAR | Status: DC
  Administered 2015-02-13 – 2015-02-14 (×5): 5000 [IU] via SUBCUTANEOUS
  Filled 2015-02-13 (×7): qty 1

## 2015-02-13 MED ORDER — HYDROCHLOROTHIAZIDE 25 MG PO TABS
25.0000 mg | ORAL_TABLET | Freq: Every day | ORAL | Status: DC
  Administered 2015-02-13 – 2015-02-14 (×2): 25 mg via ORAL
  Filled 2015-02-13 (×2): qty 1

## 2015-02-13 MED ORDER — GABAPENTIN 400 MG PO CAPS
1200.0000 mg | ORAL_CAPSULE | Freq: Three times a day (TID) | ORAL | Status: DC
  Administered 2015-02-13 – 2015-02-14 (×5): 1200 mg via ORAL
  Filled 2015-02-13 (×6): qty 3

## 2015-02-13 MED ORDER — METOPROLOL TARTRATE 25 MG PO TABS
25.0000 mg | ORAL_TABLET | Freq: Two times a day (BID) | ORAL | Status: DC
  Administered 2015-02-13 (×2): 25 mg via ORAL
  Filled 2015-02-13 (×4): qty 1

## 2015-02-13 MED ORDER — OXYCODONE HCL 5 MG PO TABS
5.0000 mg | ORAL_TABLET | Freq: Three times a day (TID) | ORAL | Status: DC | PRN
  Administered 2015-02-13 – 2015-02-14 (×2): 5 mg via ORAL
  Filled 2015-02-13 (×3): qty 1

## 2015-02-13 MED ORDER — ASPIRIN EC 81 MG PO TBEC: 81.0000 mg | DELAYED_RELEASE_TABLET | Freq: Every day | ORAL | Status: DC

## 2015-02-13 MED ORDER — NITROGLYCERIN 2 % TD OINT
1.0000 [in_us] | TOPICAL_OINTMENT | Freq: Four times a day (QID) | TRANSDERMAL | Status: DC
  Administered 2015-02-13 – 2015-02-14 (×5): 1 [in_us] via TOPICAL
  Filled 2015-02-13: qty 30

## 2015-02-13 MED ORDER — ASPIRIN EC 81 MG PO TBEC
81.0000 mg | DELAYED_RELEASE_TABLET | Freq: Every day | ORAL | Status: DC
Start: 2015-02-13 — End: 2015-02-14
  Administered 2015-02-13 – 2015-02-14 (×2): 81 mg via ORAL
  Filled 2015-02-13 (×2): qty 1

## 2015-02-13 MED ORDER — METHOCARBAMOL 750 MG PO TABS
750.0000 mg | ORAL_TABLET | Freq: Four times a day (QID) | ORAL | Status: DC | PRN
  Administered 2015-02-13 – 2015-02-14 (×4): 750 mg via ORAL
  Filled 2015-02-13 (×8): qty 1

## 2015-02-13 MED ORDER — SODIUM CHLORIDE 0.9 % IJ SOLN
3.0000 mL | Freq: Two times a day (BID) | INTRAMUSCULAR | Status: DC
  Administered 2015-02-13 – 2015-02-14 (×3): 3 mL via INTRAVENOUS

## 2015-02-13 MED ORDER — REGADENOSON 0.4 MG/5ML IV SOLN
0.4000 mg | Freq: Once | INTRAVENOUS | Status: DC
  Filled 2015-02-13: qty 5

## 2015-02-13 MED ORDER — LISINOPRIL 20 MG PO TABS
20.0000 mg | ORAL_TABLET | Freq: Every day | ORAL | Status: DC
  Administered 2015-02-13 – 2015-02-14 (×2): 20 mg via ORAL
  Filled 2015-02-13 (×2): qty 1

## 2015-02-13 MED ORDER — LISINOPRIL-HYDROCHLOROTHIAZIDE 20-25 MG PO TABS: 1.0000 | ORAL_TABLET | Freq: Every day | ORAL | Status: DC

## 2015-02-13 MED ORDER — LEVOTHYROXINE SODIUM 75 MCG PO TABS
75.0000 ug | ORAL_TABLET | Freq: Every day | ORAL | Status: DC
  Administered 2015-02-13: 75 ug via ORAL
  Filled 2015-02-13 (×3): qty 1

## 2015-02-13 NOTE — Progress Notes (Signed)
Pt reports chest pain gone after nitro administration.  Will continue to monitor pt.

## 2015-02-13 NOTE — H&P (Signed)
C.c chest pain, transfer from [facility] HPI:  59 yrs old female with pmh of htn,hlp,hep c , cirrhosis, depression fibromyalgia, cad s/p rca des in 2011 presented to [facility] ED with c/o intermittent left sided chest pain associated with nausea. No sob or doe. Initial workup including ekg and trop x 2 was negative. She is transferred to [facility] for stress testing  Non smoker Compliant with her meds   Review of Systems:     Cardiac Review of Systems: {Y] = yes [ ]  = no  Chest Pain [   y ]  Resting SOB [   ] Exertional SOB  [  ]  Orthopnea [  ]   Pedal Edema [   ]    Palpitations [  ] Syncope  [  ]   Presyncope [   ]  General Review of Systems: [Y] = yes [  ]=no Constitional: recent weight change [  ]; anorexia [  ]; fatigue [  ]; nausea [  ]; night sweats [  ]; fever [  ]; or chills [  ];                                                                     Dental: poor dentition[  ];   Eye : blurred vision [  ]; diplopia [   ]; vision changes [  ];  Amaurosis fugax[  ]; Resp: cough [  ];  wheezing[  ];  hemoptysis[  ]; shortness of breath[  ]; paroxysmal nocturnal dyspnea[  ]; dyspnea on exertion[  ]; or orthopnea[  ];  GI:  gallstones[  ], vomiting[  ];  dysphagia[  ]; melena[  ];  hematochezia [  ]; heartburn[  ];   GU: kidney stones [  ]; hematuria[  ];   dysuria [  ];  nocturia[  ];               Skin: rash [  ], swelling[  ];, hair loss[  ];  peripheral edema[  ];  or itching[  ]; Musculosketetal: myalgias[  ];  joint swelling[  ];  joint erythema[  ];  joint pain[  ];  back pain[  ];  Heme/Lymph: bruising[  ];  bleeding[  ];  anemia[  ];  Neuro: TIA[  ];  headaches[  ];  stroke[  ];  vertigo[  ];  seizures[  ];   paresthesias[  ];  difficulty walking[  ];  Psych:depression[  ]; anxiety[  ];  Endocrine: diabetes[  ];  thyroid dysfunction[  ];  Other:  Past Medical History  Diagnosis Date  . Chronic constipation   . Kidney stone   . Fibromyalgia   . Spleen enlarged   . Liver  disease   . Hepatitis C   . Depression   . Hypertension   . DDD (degenerative disc disease), lumbar     No current facility-administered medications on file prior to encounter.   Current Outpatient Prescriptions on File Prior to Encounter  Medication Sig Dispense Refill  . citalopram (CELEXA) 20 MG tablet Take 20 mg by mouth daily.     Marland Kitchen dicyclomine (BENTYL) 10 MG capsule Take 1 capsule (10 mg total) by mouth 3 (three)  times daily as needed for spasms. 90 capsule 1  . fentaNYL (DURAGESIC - DOSED MCG/HR) 50 MCG/HR Place 50 mcg onto the skin every 3 (three) days.    Marland Kitchen LINZESS 290 MCG CAPS capsule TAKE ONE CAPSULE BY MOUTH ONCE DAILY (Patient taking differently: TAKE ONE CAPSULE BY MOUTH ONCE DAILY AS NEEDED FOR IBS/SYMPTOMS) 30 capsule 5  . methocarbamol (ROBAXIN) 750 MG tablet Take 325-750 mg by mouth every 6 (six) hours as needed for muscle spasms.     Marland Kitchen oxyCODONE (OXY IR/ROXICODONE) 5 MG immediate release tablet Take 5-10 mg by mouth every 8 (eight) hours as needed for moderate pain or severe pain.     . bisacodyl (DULCOLAX) 10 MG suppository Place 1 suppository (10 mg total) rectally daily as needed for moderate constipation. (Patient not taking: Reported on 02/12/2015) 14 suppository 0  . budesonide-formoterol (SYMBICORT) 160-4.5 MCG/ACT inhaler Inhale 1 puff into the lungs daily as needed (FOR SHORTNESS OF BREATH).         Allergies  Allergen Reactions  . Ampicillin Hives  . Ciprofloxacin Hives  . Sulfamethoxazole Hives    History   Social History  . Marital Status: Divorced    Spouse Name: N/A  . Number of Children: N/A  . Years of Education: N/A   Occupational History  . Not on file.   Social History Main Topics  . Smoking status: Current Every Day Smoker -- 2.00 packs/day    Types: Cigarettes  . Smokeless tobacco: Never Used     Comment: Patient states that she smokes 2 pack a day , has smoked since she was 34.  Marland Kitchen Alcohol Use: No  . Drug Use: No  . Sexual  Activity: Not on file   Other Topics Concern  . Not on file   Social History Narrative    Family History  Problem Relation Age of Onset  . Healthy Mother   . Diabetes Father   . Heart disease Father   . Hypertension Father   . Healthy Brother   . Healthy Brother   . Healthy Daughter   . Healthy Son     PHYSICAL EXAM: Filed Vitals:   02/13/15 0251  BP: 144/82  Pulse: 58  Temp: 97.9 F (36.6 C)  Resp: 18   General:  Well appearing. No respiratory difficulty HEENT: normal Neck: supple. no JVD. Carotids 2+ bilat; no bruits. No lymphadenopathy or thryomegaly appreciated. Cor: PMI nondisplaced. Regular rate & rhythm. No rubs, gallops or murmurs. Lungs: clear Abdomen: soft, nontender, nondistended. No hepatosplenomegaly. No bruits or masses. Good bowel sounds. Extremities: no cyanosis, clubbing, rash, edema Neuro: alert & oriented x 3, cranial nerves grossly intact. moves all 4 extremities w/o difficulty. Affect pleasant.  ECG:  Results for orders placed or performed during the hospital encounter of 02/12/15 (from the past 24 hour(s))  CBC     Status: Abnormal   Collection Time: 02/12/15  5:38 PM  Result Value Ref Range   WBC 7.1 4.0 - 10.5 K/uL   RBC 4.64 3.87 - 5.11 MIL/uL   Hemoglobin 14.1 12.0 - 15.0 g/dL   HCT 42.2 36.0 - 46.0 %   MCV 90.9 78.0 - 100.0 fL   MCH 30.4 26.0 - 34.0 pg   MCHC 33.4 30.0 - 36.0 g/dL   RDW 13.8 11.5 - 15.5 %   Platelets 115 (L) 150 - 400 K/uL  Basic metabolic panel     Status: Abnormal   Collection Time: 02/12/15  5:38 PM  Result Value Ref  Range   Sodium 139 135 - 145 mmol/L   Potassium 3.8 3.5 - 5.1 mmol/L   Chloride 99 (L) 101 - 111 mmol/L   CO2 33 (H) 22 - 32 mmol/L   Glucose, Bld 97 65 - 99 mg/dL   BUN 11 6 - 20 mg/dL   Creatinine, Ser 0.69 0.44 - 1.00 mg/dL   Calcium 9.5 8.9 - 10.3 mg/dL   GFR calc non Af Amer >60 >60 mL/min   GFR calc Af Amer >60 >60 mL/min   Anion gap 7 5 - 15  Troponin I     Status: None   Collection  Time: 02/12/15  5:38 PM  Result Value Ref Range   Troponin I <0.03 <0.031 ng/mL  Troponin I     Status: None   Collection Time: 02/12/15  8:04 PM  Result Value Ref Range   Troponin I <0.03 <0.031 ng/mL   Dg Chest 2 View  02/12/2015   CLINICAL DATA:  Chest pain times 2-1/2 weeks  EXAM: CHEST  2 VIEW  COMPARISON:  None.  FINDINGS: Lungs are clear.  No pleural effusion or pneumothorax.  The heart is normal size.  Visualized osseous structures are within normal limits.  IMPRESSION: No evidence of acute cardiopulmonary disease.   Electronically Signed   By: Julian Hy M.D.   On: 02/12/2015 18:25     ASSESSMENT: Patient Active Problem List   Diagnosis Date Noted  . Chest pain 02/13/2015  . Chest pain, rule out acute myocardial infarction 02/12/2015  . Hepatitis C 09/28/2013  . Constipation, chronic 08/03/2013  . Abdominal pain, left lower quadrant 08/03/2013  . Cirrhosis 08/03/2013  . Hyperlipidemia 04/25/2009  . OVERWEIGHT/OBESITY 04/25/2009  . CAD, NATIVE VESSEL 04/25/2009  . DEPRESSION 01/21/2008  . Essential hypertension 01/21/2008  . ESOPHAGITIS, REFLUX 03/10/2007  . COLONIC POLYPS, HYPERPLASTIC 02/28/2007     PLAN/DISCUSSION:  chset pain r/o ACS, atypical in nature. But she has risk factors and previous h/o CAD Trop x 2 negative NPO  Nuclear stress test    [place]

## 2015-02-13 NOTE — Progress Notes (Signed)
   02/13/15 0505  Vitals  Temp 97.6 F (36.4 C)  Temp Source Oral  BP (!) 121/57 mmHg  BP Location Left Arm  BP Method Automatic  Patient Position (if appropriate) Lying  Pulse Rate 60  Pulse Rate Source Dinamap  Resp 18  Oxygen Therapy  SpO2 97 %  O2 Device Room Air  Pt c/o chest pain around 5am of 8 described as crushing pressure like an elephant.  Pt VS were obtained and Dr was notified.  Pt received nitro and ECG showing SB.  Dr arrived around 5:20 to bedside. Will continue to monitor.

## 2015-02-13 NOTE — Progress Notes (Signed)
Pt called with c/o 6 chest pressure. Dr allred at bedside. Ekg repeated. 2l [place] applied. Ntg sl given

## 2015-02-13 NOTE — Progress Notes (Signed)
SUBJECTIVE: She has chronic back pain which is stable.  Mild SOB and chest discomfort ongoing.  Marland Kitchen aspirin EC  81 mg Oral Daily  . gabapentin  1,200 mg Oral TID  . heparin  5,000 Units Subcutaneous 3 times per day  . lisinopril  20 mg Oral Daily   And  . hydrochlorothiazide  25 mg Oral Daily  . levothyroxine  75 mcg Oral QAC breakfast  . metoprolol tartrate  25 mg Oral BID  . [START ON 02/14/2015] regadenoson  0.4 mg Intravenous Once  . sodium chloride  3 mL Intravenous Q12H      OBJECTIVE: Physical Exam: Filed Vitals:   02/13/15 0251 02/13/15 0505 02/13/15 0948 02/13/15 1141  BP: 144/82 121/57 134/71 136/74  Pulse: 58 60  51  Temp: 97.9 F (36.6 C) 97.6 F (36.4 C)    TempSrc: Oral Oral    Resp: 18 18    Height: 5\' 11"  (1.803 m)     Weight: 98.476 kg (217 lb 1.6 oz)     SpO2: 96% 97%  96%    Intake/Output Summary (Last 24 hours) at 02/13/15 1145 Last data filed at 02/13/15 0900  Gross per 24 hour  Intake      0 ml  Output   1000 ml  Net  -1000 ml    Telemetry reveals sinus rhythm  GEN- The patient is overweight appearing, alert and oriented x 3 today.   Head- normocephalic, atraumatic Eyes-  Sclera clear, conjunctiva pink Ears- hearing intact Oropharynx- clear Neck- supple  Lungs- Clear to ausculation bilaterally, normal work of breathing Heart- Regular rate and rhythm, no murmurs, rubs or gallops, PMI not laterally displaced GI- soft, NT, ND, + BS Extremities- no clubbing, cyanosis, or edema Skin- no rash or lesion Psych- euthymic mood, full affect Neuro- strength and sensation are intact  LABS: Basic Metabolic Panel:  Recent Labs  02/12/15 1738 02/13/15 0740  NA 139  --   K 3.8  --   CL 99*  --   CO2 33*  --   GLUCOSE 97  --   BUN 11  --   CREATININE 0.69 0.60  CALCIUM 9.5  --    Liver Function Tests: No results for input(s): AST, ALT, ALKPHOS, BILITOT, PROT, ALBUMIN in the last 72 hours. No results for input(s): LIPASE, AMYLASE in the  last 72 hours. CBC:  Recent Labs  02/12/15 1738 02/13/15 0740  WBC 7.1 5.6  HGB 14.1 13.8  HCT 42.2 40.4  MCV 90.9 89.0  PLT 115* 96*   Cardiac ERADIOLOGY: Dg Chest 2 View  02/12/2015   CLINICAL DATA:  Chest pain times 2-1/2 weeks  EXAM: CHEST  2 VIEW  COMPARISON:  None.  FINDINGS: Lungs are clear.  No pleural effusion or pneumothorax.  The heart is normal size.  Visualized osseous structures are within normal limits.  IMPRESSION: No evidence of acute cardiopulmonary disease.   Electronically Signed   By: Julian Hy M.D.   On: 02/12/2015 18:25    ASSESSMENT AND PLAN:  Principal Problem:   Chest pain Active Problems:   Hyperlipidemia   Essential hypertension   CAD, NATIVE VESSEL   Cirrhosis   Hepatitis C   Chest pain, rule out acute myocardial infarction  1. Chest pain Both typical and atypical features Unable to accomidate myoview on todays schedule CMs negative and ekg without ischemic changes Cath 2013 was reviewed and did not show ischemia  2. htn Stable No change required today  Will  make NPO for myoview tomorrow.  Thompson Grayer, MD 02/13/2015 11:45 AM

## 2015-02-13 NOTE — Progress Notes (Signed)
Pt pain free after 1 Ntg

## 2015-02-14 ENCOUNTER — Observation Stay (HOSPITAL_COMMUNITY): Payer: Medicare Other

## 2015-02-14 ENCOUNTER — Encounter (HOSPITAL_COMMUNITY): Payer: Self-pay | Admitting: Physician Assistant

## 2015-02-14 DIAGNOSIS — R079 Chest pain, unspecified: Secondary | ICD-10-CM | POA: Diagnosis not present

## 2015-02-14 DIAGNOSIS — R0789 Other chest pain: Secondary | ICD-10-CM

## 2015-02-14 LAB — NM MYOCAR MULTI W/SPECT W/WALL MOTION / EF
LV dias vol: 117 mL
LV sys vol: 41 mL
RATE: 0.37
SDS: 0
SRS: 0
SSS: 0
Stage 1 Grade: 0 %
Stage 1 HR: 51 {beats}/min
Stage 1 Speed: 0 mph
Stage 2 Grade: 0 %
Stage 2 HR: 51 {beats}/min
Stage 2 Speed: 0 mph
Stage 3 DBP: 57 mmHg
Stage 3 Grade: 0 %
Stage 3 HR: 69 {beats}/min
Stage 3 SBP: 126 mmHg
Stage 3 Speed: 0 mph
Stage 4 DBP: 57 mmHg
Stage 4 Grade: 0 %
Stage 4 HR: 65 {beats}/min
Stage 4 SBP: 122 mmHg
Stage 4 Speed: 0 mph
Stage 5 DBP: 50 mmHg
Stage 5 Grade: 0 %
Stage 5 HR: 64 {beats}/min
Stage 5 SBP: 125 mmHg
Stage 5 Speed: 0 mph
TID: 1.16

## 2015-02-14 MED ORDER — TECHNETIUM TC 99M SESTAMIBI GENERIC - CARDIOLITE
10.0000 | Freq: Once | INTRAVENOUS | Status: AC | PRN
Start: 2015-02-14 — End: 2015-02-14
  Administered 2015-02-14: 10 via INTRAVENOUS

## 2015-02-14 MED ORDER — TECHNETIUM TC 99M SESTAMIBI GENERIC - CARDIOLITE
30.0000 | Freq: Once | INTRAVENOUS | Status: AC | PRN
  Administered 2015-02-14: 30 via INTRAVENOUS

## 2015-02-14 MED ORDER — FENTANYL 25 MCG/HR TD PT72
50.0000 ug | MEDICATED_PATCH | Freq: Once | TRANSDERMAL | Status: DC
  Administered 2015-02-14: 50 ug via TRANSDERMAL
  Filled 2015-02-14: qty 2

## 2015-02-14 MED ORDER — REGADENOSON 0.4 MG/5ML IV SOLN
0.4000 mg | Freq: Once | INTRAVENOUS | Status: AC
  Administered 2015-02-14: 0.4 mg via INTRAVENOUS

## 2015-02-14 MED ORDER — REGADENOSON 0.4 MG/5ML IV SOLN
INTRAVENOUS | Status: AC
  Filled 2015-02-14: qty 5

## 2015-02-14 NOTE — Progress Notes (Signed)
Patient Name: Teresa Benitez Date of Encounter: 02/14/2015  Primary Cardiologist: new (seen by Dr. Lutricia Feil remotely)   Principal Problem:   Chest pain Active Problems:   Hyperlipidemia   Essential hypertension   CAD, NATIVE VESSEL   Cirrhosis   Hepatitis C   Chest pain, rule out acute myocardial infarction    SUBJECTIVE  Denies any CP or SOB. Some chronic back pain  CURRENT MEDS . aspirin EC  81 mg Oral Daily  . gabapentin  1,200 mg Oral TID  . heparin  5,000 Units Subcutaneous 3 times per day  . lisinopril  20 mg Oral Daily   And  . hydrochlorothiazide  25 mg Oral Daily  . levothyroxine  75 mcg Oral QAC breakfast  . metoprolol tartrate  25 mg Oral BID  . nitroGLYCERIN  1 inch Topical 4 times per day  . regadenoson      . regadenoson  0.4 mg Intravenous Once  . sodium chloride  3 mL Intravenous Q12H    OBJECTIVE  Filed Vitals:   02/14/15 0928 02/14/15 0929 02/14/15 0931 02/14/15 0933  BP:  126/57 122/57 125/50  Pulse: 61 69 67 63  Temp:      TempSrc:      Resp:      Height:      Weight:      SpO2:        Intake/Output Summary (Last 24 hours) at 02/14/15 0940 Last data filed at 02/14/15 8182  Gross per 24 hour  Intake    290 ml  Output   1300 ml  Net  -1010 ml   Filed Weights   02/12/15 1702 02/13/15 0251 02/14/15 0634  Weight: 218 lb (98.884 kg) 217 lb 1.6 oz (98.476 kg) 213 lb 11.2 oz (96.934 kg)    PHYSICAL EXAM  General: Pleasant, NAD. Neuro: Alert and oriented X 3. Moves all extremities spontaneously. Psych: Normal affect. HEENT:  Normal  Neck: Supple without bruits or JVD. Lungs:  Resp regular and unlabored, CTA. Heart: RRR no s3, s4, or murmurs. Abdomen: Soft, non-tender, non-distended, BS + x 4.  Extremities: No clubbing, cyanosis or edema. DP/PT/Radials 2+ and equal bilaterally.  Accessory Clinical Findings  CBC  Recent Labs  02/12/15 1738 02/13/15 0740  WBC 7.1 5.6  HGB 14.1 13.8  HCT 42.2 40.4  MCV 90.9 89.0  PLT 115*  96*   Basic Metabolic Panel  Recent Labs  02/12/15 1738 02/13/15 0740  NA 139  --   K 3.8  --   CL 99*  --   CO2 33*  --   GLUCOSE 97  --   BUN 11  --   CREATININE 0.69 0.60  CALCIUM 9.5  --    Liver Function Tests No results for input(s): AST, ALT, ALKPHOS, BILITOT, PROT, ALBUMIN in the last 72 hours. No results for input(s): LIPASE, AMYLASE in the last 72 hours. Cardiac Enzymes  Recent Labs  02/12/15 1738 02/12/15 2004  TROPONINI <0.03 <0.03   ECG  Normal EKG 5/22  Radiology/Studies  Dg Chest 2 View  02/12/2015   CLINICAL DATA:  Chest pain times 2-1/2 weeks  EXAM: CHEST  2 VIEW  COMPARISON:  None.  FINDINGS: Lungs are clear.  No pleural effusion or pneumothorax.  The heart is normal size.  Visualized osseous structures are within normal limits.  IMPRESSION: No evidence of acute cardiopulmonary disease.   Electronically Signed   By: Julian Hy M.D.   On: 02/12/2015 18:25  ASSESSMENT AND PLAN  1. Chest pain with typical and atypical features  - serial enzyme negative, no EKG changes, pending myoview result, if negative can be discharged  2. CAD s/p RCA DES in 2011 3. HTN 4. Cirrhosis and Hep C 5. Depression  6. Fibromyalgia  Signed, Almyra Deforest PA-C Pager: [phone]   Attending Note:   The patient was seen and examined.  Agree with assessment and plan as noted above.  Changes made to the above note as needed.  Pt with a remote stent. Has not been on a statin - presumably due to her cirrhosis. She needs to follow a better diet and exercise program.  DC to home today if myoview shows no ischemia.   Thayer Headings, Brooke Bonito., MD, [facility] 02/14/2015, 1:12 PM [address],  [place] Pager [phone]

## 2015-02-14 NOTE — Discharge Summary (Signed)
Discharge Summary   Patient ID: Teresa Benitez,  MRN: [id number], DOB/AGE: [birth year] 59 y.o.  Admit date: 02/12/2015 Discharge date: 02/14/2015  Primary Care Provider: TAPPER,DAVID B Primary Cardiologist: new - (scheduled to see Dr. Bronson Ing)  Discharge Diagnoses Principal Problem:   Chest pain Active Problems:   Hyperlipidemia   Essential hypertension   CAD, NATIVE VESSEL   Cirrhosis   Hepatitis C   Chest pain, rule out acute myocardial infarction   Allergies Allergies  Allergen Reactions  . Ampicillin Hives  . Ciprofloxacin Hives  . Sulfamethoxazole Hives    Procedures  Lexiscan stress test 02/14/2015 Study Result      The study is normal.  This is a low risk study.  The left ventricular ejection fraction is normal (55-65%).  There was no ST segment deviation noted during stress.  No T wave inversion was noted during stress.  Low risk pharmacological stress nuclear study with normal perfusion and normal left ventricular regional and global systolic function.        Hospital Course  The patient is a 59 year old Caucasian female with PMH of HTN, HLD, cirrhosis and a history of hepatitis C, depression, fibromyalgia and CAD. (?cath history, per IM note, RCA des in 2011. Per cardiology admission note no ischemia on cath 2013. Per pt, last cath more than 10 yrs ago) She presented to [facility] ED on 02/12/2015 with intermittent left-sided chest pain. She was previously scheduled to see a cardiologist in our [place] office on 6/6, however had proggressive chest pain prompting her to seek medical attention. She was eventually transferred to [facility] on 5/22.   It was felt that she has both typical and atypical features. Given negative troponin and no EKG changes, she was scheduled to undergo Lexiscan stress test. She undergo the planned stress test in the morning of 02/14/2015 which showed EF 55-65%, low risk stress test test with normal perfusion and  normal left ventricular regional and global systolic function. She is deemed stable for discharge from cardiology perspective. She will continue to keep her previously arranged cardiology follow-up in our [place] office given her risk factors.   Discharge Vitals Blood pressure 120/62, pulse 62, temperature 98.6 F (37 C), temperature source Oral, resp. rate 16, height 5\' 11"  (1.803 m), weight 213 lb 11.2 oz (96.934 kg), SpO2 97 %.  Filed Weights   02/12/15 1702 02/13/15 0251 02/14/15 0634  Weight: 218 lb (98.884 kg) 217 lb 1.6 oz (98.476 kg) 213 lb 11.2 oz (96.934 kg)    Labs  CBC  Recent Labs  02/12/15 1738 02/13/15 0740  WBC 7.1 5.6  HGB 14.1 13.8  HCT 42.2 40.4  MCV 90.9 89.0  PLT 115* 96*   Basic Metabolic Panel  Recent Labs  02/12/15 1738 02/13/15 0740  NA 139  --   K 3.8  --   CL 99*  --   CO2 33*  --   GLUCOSE 97  --   BUN 11  --   CREATININE 0.69 0.60  CALCIUM 9.5  --    Cardiac Enzymes  Recent Labs  02/12/15 1738 02/12/15 2004  TROPONINI <0.03 <0.03    Disposition  Pt is being discharged home today in good condition.  Follow-up Plans & Appointments      Follow-up Information    Follow up with Herminio Commons, MD On 02/28/2015.   Specialty:  Cardiology   Why:  1:20pm   Contact information:   [place]  [phone]       Discharge Medications    Medication List    STOP taking these medications        bisacodyl 10 MG suppository  Commonly known as:  DULCOLAX      TAKE these medications        aspirin EC 81 MG tablet  Take 81 mg by mouth daily.     budesonide-formoterol 160-4.5 MCG/ACT inhaler  Commonly known as:  SYMBICORT  Inhale 1 puff into the lungs daily as needed (FOR SHORTNESS OF BREATH).     citalopram 20 MG tablet  Commonly known as:  CELEXA  Take 20 mg by mouth daily.     dicyclomine 10 MG capsule  Commonly known as:  BENTYL  Take 1 capsule (10 mg total) by mouth 3 (three) times  daily as needed for spasms.     fentaNYL 50 MCG/HR  Commonly known as:  DURAGESIC - dosed mcg/hr  Place 50 mcg onto the skin every 3 (three) days.     gabapentin 300 MG capsule  Commonly known as:  NEURONTIN  Take 1,200 mg by mouth 3 (three) times daily.     levothyroxine 75 MCG tablet  Commonly known as:  SYNTHROID, LEVOTHROID  Take 75 mcg by mouth daily before breakfast.     LINZESS 290 MCG Caps capsule  Generic drug:  Linaclotide  TAKE ONE CAPSULE BY MOUTH ONCE DAILY     lisinopril-hydrochlorothiazide 20-25 MG per tablet  Commonly known as:  PRINZIDE,ZESTORETIC  Take 1 tablet by mouth daily.     methocarbamol 750 MG tablet  Commonly known as:  ROBAXIN  Take 325-750 mg by mouth every 6 (six) hours as needed for muscle spasms.     metoprolol tartrate 25 MG tablet  Commonly known as:  LOPRESSOR  Take 25 mg by mouth 2 (two) times daily.     oxyCODONE 5 MG immediate release tablet  Commonly known as:  Oxy IR/ROXICODONE  Take 5-10 mg by mouth every 8 (eight) hours as needed for moderate pain or severe pain.     triamcinolone 55 MCG/ACT Aero nasal inhaler  Commonly known as:  NASACORT  Place 2 sprays into the nose daily as needed (FOR ALLERGIES).         Duration of Discharge Encounter   Greater than 30 minutes including physician time.  Hilbert Corrigan PA-C Pager: [phone] 02/14/2015, 4:12 PM   Attending Note:   The patient was seen and examined.  Agree with assessment and plan as noted above.  Changes made to the above note as needed.  The myoview was low risk.  She may follow up with her medical doctor     Thayer Headings, Brooke Bonito., MD, [facility] 02/14/2015, 5:51 PM [address],  [place] Pager [phone]

## 2015-02-14 NOTE — Progress Notes (Deleted)
Maintenance was called and requested to check if call bell.  No issues with call bell, as it is working properly.

## 2015-02-14 NOTE — Progress Notes (Signed)
Orders received for pt discharge.  Discharge summary printed and reviewed with pt.  Explained medication regimen, and pt had no further questions at this time.  IV removed and site remains clean, dry, intact.  Telemetry removed.  Pt in stable condition and awaiting transport. 

## 2015-02-14 NOTE — Discharge Instructions (Signed)

## 2015-02-14 NOTE — Progress Notes (Signed)
Lexiscan stress test completed without complication, pending final result by [facility] reader.  Hilbert Corrigan PA Pager: [phone]

## 2015-02-23 ENCOUNTER — Encounter (INDEPENDENT_AMBULATORY_CARE_PROVIDER_SITE_OTHER): Payer: Self-pay | Admitting: *Deleted

## 2015-02-28 ENCOUNTER — Ambulatory Visit: Payer: Medicare Other | Admitting: Cardiovascular Disease

## 2015-06-13 ENCOUNTER — Ambulatory Visit (HOSPITAL_COMMUNITY)
Admission: RE | Admit: 2015-06-13 | Discharge: 2015-06-13 | Disposition: A | Discharge status: Home / Self Care | Payer: Medicare Other | Source: Ambulatory Visit | Attending: Internal Medicine | Admitting: Internal Medicine

## 2015-06-13 ENCOUNTER — Encounter (INDEPENDENT_AMBULATORY_CARE_PROVIDER_SITE_OTHER): Payer: Self-pay | Admitting: Internal Medicine

## 2015-06-13 ENCOUNTER — Ambulatory Visit (INDEPENDENT_AMBULATORY_CARE_PROVIDER_SITE_OTHER): Payer: Medicare Other | Admitting: Internal Medicine

## 2015-06-13 ENCOUNTER — Encounter (INDEPENDENT_AMBULATORY_CARE_PROVIDER_SITE_OTHER): Payer: Self-pay | Admitting: *Deleted

## 2015-06-13 VITALS — BP 130/54 | HR 64 | Temp 98.8°F | Ht 71.5 in | Wt 213.0 lb

## 2015-06-13 DIAGNOSIS — R61 Generalized hyperhidrosis: Secondary | ICD-10-CM

## 2015-06-13 DIAGNOSIS — K7460 Unspecified cirrhosis of liver: Secondary | ICD-10-CM | POA: Diagnosis not present

## 2015-06-13 DIAGNOSIS — R509 Fever, unspecified: Secondary | ICD-10-CM | POA: Insufficient documentation

## 2015-06-13 DIAGNOSIS — B1920 Unspecified viral hepatitis C without hepatic coma: Secondary | ICD-10-CM | POA: Diagnosis not present

## 2015-06-13 DIAGNOSIS — R634 Abnormal weight loss: Secondary | ICD-10-CM

## 2015-06-13 NOTE — Progress Notes (Signed)
Subjective:    Patient ID: Teresa Benitez, female    DOB: April 19, 1956, 59 y.o.   MRN: [id number]  HPI Here today with c/o left upper quadrant pain. She says she rates her pain at a 10. She has nausea.  She has lost weight. She has lost about 19 pounds since her last visit. She has lack of appetite. She says somedays she cannot eat. Symptoms for about a month.  She has actually had this pain per notes since 2008. Has seen Dr. Sharlett Iles for same.  The pain does not worsen if she is constipated or not. It is not related to movement.  She usually has a BM every other day. She takes Linzess every other day because if she takes every day she will have diarrhea.  No melena or BRRB.  She also states she has had low grade fever and night sweats for the past several months.   She was lat seen by Dr. Laural Golden in 03/2014. She c/o LUQ pain at that [place].  She has history of cirrhosis and hepatitis C which was successfully treated at [facility] in 2011.  03/30/2014 CT abdomen/pelvis with CM: IMPRESSION: 1. No acute findings or explanation for left-sided abdominal pain. 2. Stable changes of hepatic cirrhosis and portal hypertension with mild splenomegaly and recanalization of the left paraumbilical vein. 3. Nonobstructing left renal calculi. 4. Stable small right adrenal adenoma.  06/09/2015 H and h 13.8 AND 41.1, MCV 87.9,Platelet CT 112. ALP 90, Albumin 3.09SGOT 27.5, SGPT 18.  Total bili 0.55.  Marland Kitchen     And a colonoscopy in June 2008 revealing tiny hyperplastic polyps.  EGD in June 2008 revealed changes of mild reflux esophagitis.   Review of Systems Past Medical History  Diagnosis Date  . Chronic constipation   . Kidney stone   . Fibromyalgia   . Spleen enlarged   . Liver disease   . Hepatitis C   . Depression   . Hypertension   . DDD (degenerative disc disease), lumbar   . Chest pain     negative myoview 02/14/2015  . Thrombocytopenia     Past Surgical History  Procedure Laterality Date  .  Para thyhoid  2005  . Partial hysterectomy      Patient states that she had 16 years ago?1998  . Colonoscopy  5-6 years ago     Done In [place]  . Upper gastrointestinal endoscopy    . Cesarean section  1996    Allergies  Allergen Reactions  . Ampicillin Hives  . Ciprofloxacin Hives  . Sulfamethoxazole Hives    Current Outpatient Prescriptions on File Prior to Visit  Medication Sig Dispense Refill  . budesonide-formoterol (SYMBICORT) 160-4.5 MCG/ACT inhaler Inhale 1 puff into the lungs daily as needed (FOR SHORTNESS OF BREATH).     . citalopram (CELEXA) 20 MG tablet Take 20 mg by mouth daily.     Marland Kitchen dicyclomine (BENTYL) 10 MG capsule Take 1 capsule (10 mg total) by mouth 3 (three) times daily as needed for spasms. (Patient taking differently: Take 10 mg by mouth as needed for spasms. ) 90 capsule 1  . fentaNYL (DURAGESIC - DOSED MCG/HR) 50 MCG/HR Place 50 mcg onto the skin every 3 (three) days.    Marland Kitchen gabapentin (NEURONTIN) 300 MG capsule Take 1,600 mg by mouth 3 (three) times daily.     Marland Kitchen levothyroxine (SYNTHROID, LEVOTHROID) 75 MCG tablet Take 75 mcg by mouth daily before breakfast.    . LINZESS 290 MCG CAPS capsule TAKE  ONE CAPSULE BY MOUTH ONCE DAILY (Patient taking differently: TAKE ONE CAPSULE BY MOUTH ONCE DAILY AS NEEDED FOR IBS/SYMPTOMS) 30 capsule 5  . lisinopril-hydrochlorothiazide (PRINZIDE,ZESTORETIC) 20-25 MG per tablet Take 1 tablet by mouth daily.    Marland Kitchen oxyCODONE (OXY IR/ROXICODONE) 5 MG immediate release tablet Take 5-10 mg by mouth every 8 (eight) hours as needed for moderate pain or severe pain.     Marland Kitchen triamcinolone (NASACORT) 55 MCG/ACT AERO nasal inhaler Place 2 sprays into the nose daily as needed (FOR ALLERGIES).    . methocarbamol (ROBAXIN) 750 MG tablet Take 325-750 mg by mouth every 6 (six) hours as needed for muscle spasms.     . metoprolol tartrate (LOPRESSOR) 25 MG tablet Take 25 mg by mouth 2 (two) times daily.     No current facility-administered  medications on file prior to visit.        Objective:   Physical Exam Blood pressure 130/54, pulse 64, temperature 98.8 F (37.1 C), height 5' 11.5" (1.816 m), weight 213 lb (96.616 kg). Alert and oriented. Skin warm and dry. Oral mucosa is moist.   . Sclera anicteric, conjunctivae is pink. Thyroid not enlarged. No cervical lymphadenopathy. Lungs clear. Heart regular rate and rhythm.  Abdomen is soft. Bowel sounds are positive. No hepatomegaly. No abdominal masses felt. Tenderness LUQ.  No edema to lower extremities.         Assessment & Plan:  Left upper quadrant pain ? Etiology. Has had multiple CT's for this which has not shown anything.  ? Etiology. Am going to get a CT scan abdomen/pelvis with CM. She also needs surveillance for her Hep C.  She has had weight loss.  Fever and night sweats: Will get a Chest xray.

## 2015-06-13 NOTE — Patient Instructions (Addendum)
Ct abdomen/pelvis with CM. Further recommendation to follow.   Chest xray today for night sweats and fever.

## 2015-06-17 ENCOUNTER — Encounter (INDEPENDENT_AMBULATORY_CARE_PROVIDER_SITE_OTHER): Payer: Self-pay

## 2015-06-22 ENCOUNTER — Ambulatory Visit (HOSPITAL_COMMUNITY)
Admission: RE | Admit: 2015-06-22 | Discharge: 2015-06-22 | Disposition: A | Discharge status: Home / Self Care | Payer: Medicare Other | Source: Ambulatory Visit | Attending: Internal Medicine | Admitting: Internal Medicine

## 2015-06-22 ENCOUNTER — Other Ambulatory Visit (INDEPENDENT_AMBULATORY_CARE_PROVIDER_SITE_OTHER): Payer: Self-pay | Admitting: Internal Medicine

## 2015-06-22 DIAGNOSIS — B1920 Unspecified viral hepatitis C without hepatic coma: Secondary | ICD-10-CM

## 2015-06-22 DIAGNOSIS — R110 Nausea: Secondary | ICD-10-CM | POA: Diagnosis not present

## 2015-06-22 DIAGNOSIS — K7460 Unspecified cirrhosis of liver: Secondary | ICD-10-CM

## 2015-06-22 DIAGNOSIS — R630 Anorexia: Secondary | ICD-10-CM | POA: Diagnosis not present

## 2015-06-22 DIAGNOSIS — K766 Portal hypertension: Secondary | ICD-10-CM | POA: Insufficient documentation

## 2015-06-22 DIAGNOSIS — R634 Abnormal weight loss: Secondary | ICD-10-CM | POA: Diagnosis not present

## 2015-06-22 DIAGNOSIS — N200 Calculus of kidney: Secondary | ICD-10-CM | POA: Diagnosis not present

## 2015-06-22 DIAGNOSIS — R109 Unspecified abdominal pain: Secondary | ICD-10-CM | POA: Diagnosis not present

## 2015-06-22 DIAGNOSIS — D3501 Benign neoplasm of right adrenal gland: Secondary | ICD-10-CM | POA: Insufficient documentation

## 2015-06-22 MED ORDER — IOHEXOL 300 MG/ML  SOLN
100.0000 mL | Freq: Once | INTRAMUSCULAR | Status: AC | PRN
  Administered 2015-06-22: 100 mL via INTRAVENOUS

## 2015-07-07 IMAGING — DX DG CHEST 2V
2 series · 2 of 2 positions shown · non-contrast
Comparison: None.

CLINICAL DATA: Chest pain times 2-1/2 weeks

EXAM:
CHEST  2 VIEW

[chest pa]
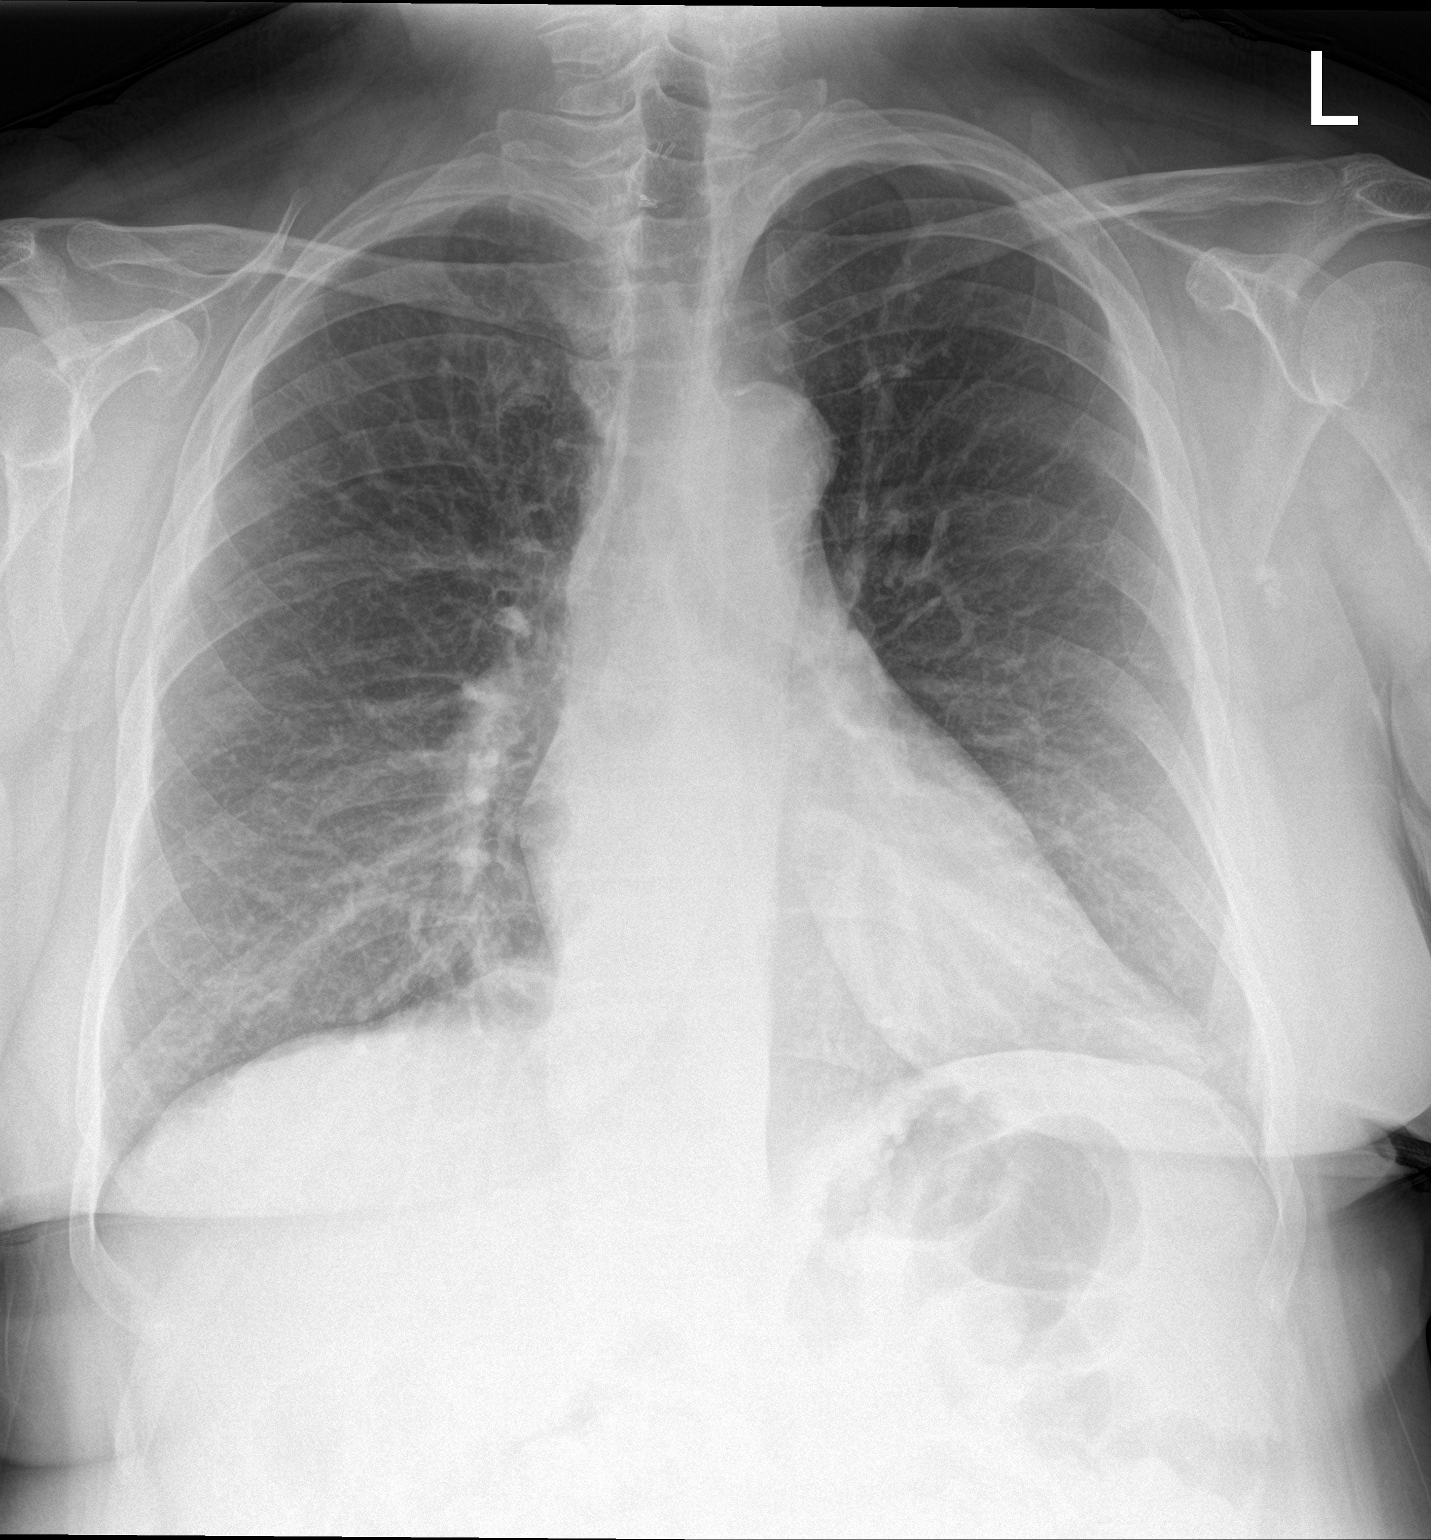

[chest lat]
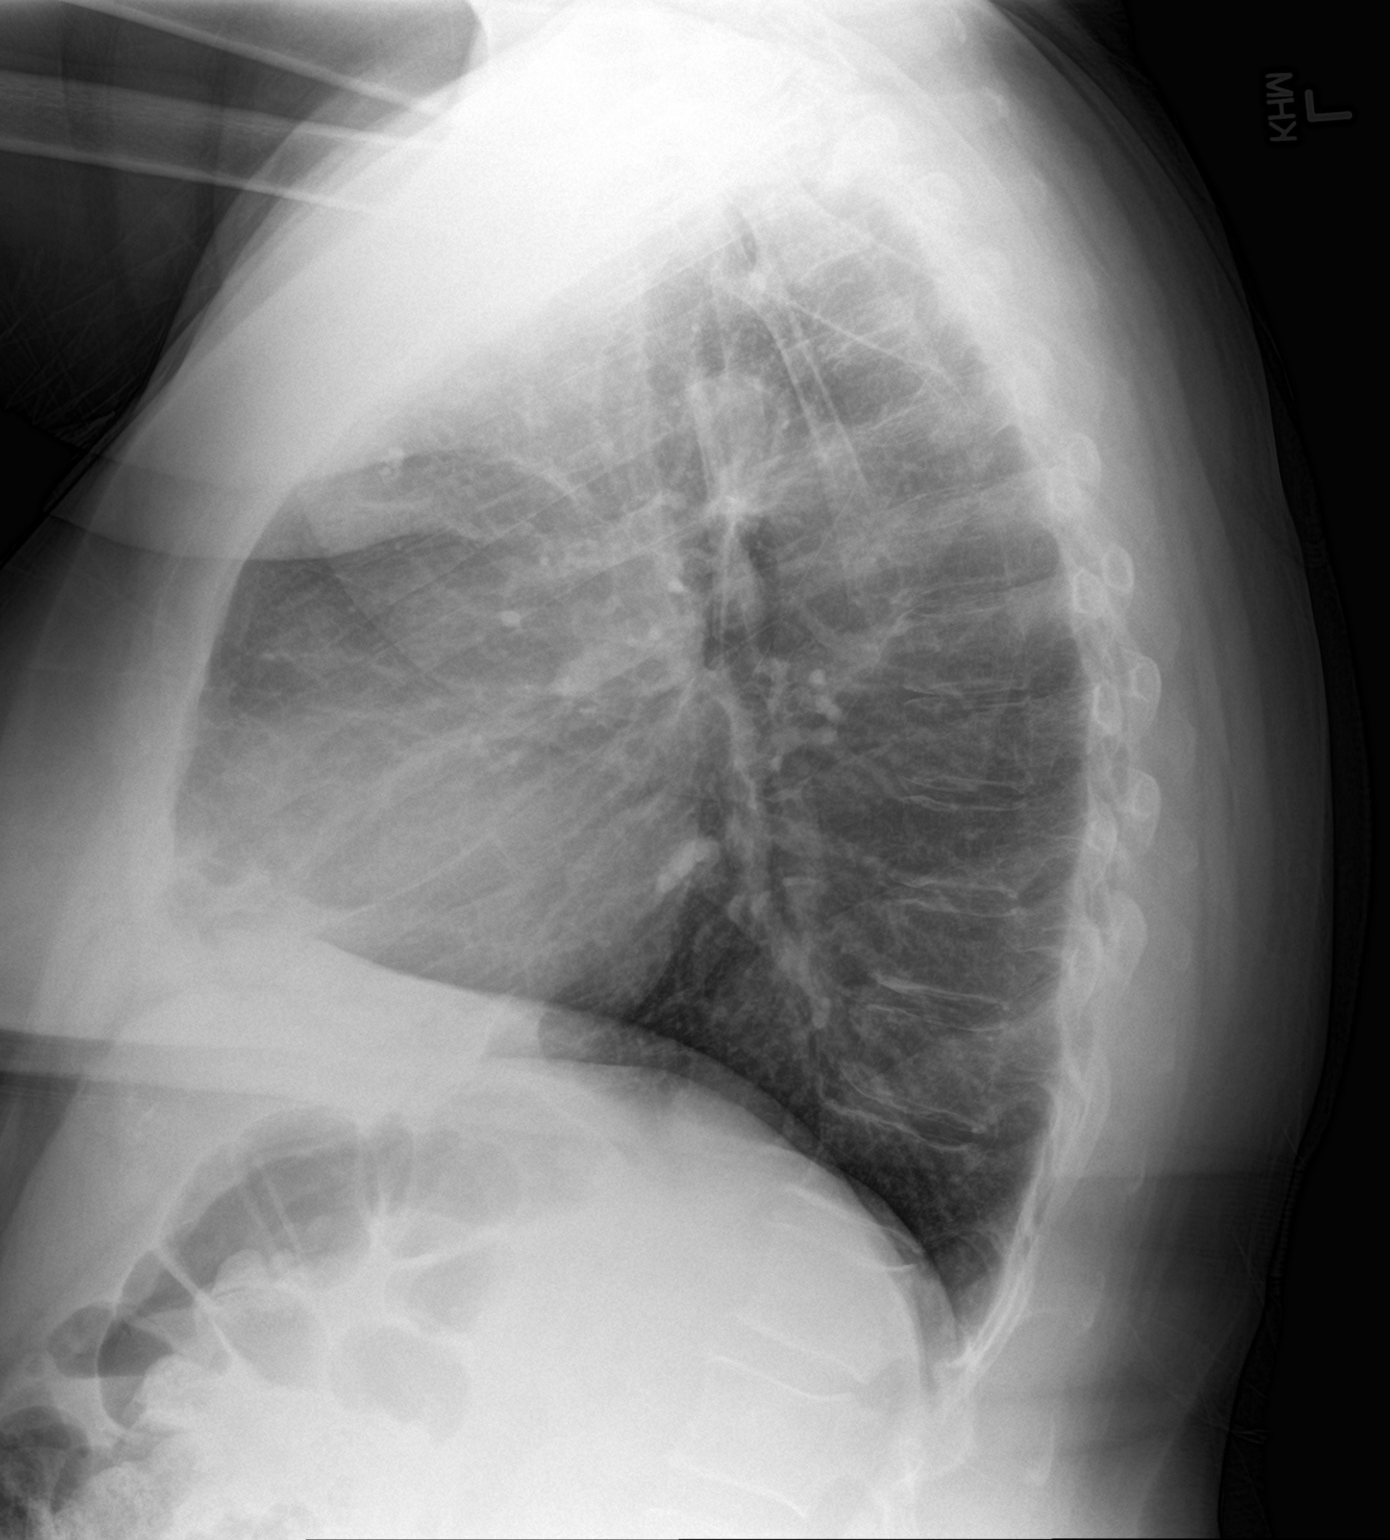

[2 of 2 positions shown; findings below may reference images not displayed]

FINDINGS: Lungs are clear.  No pleural effusion or pneumothorax.

The heart is normal size.

Visualized osseous structures are within normal limits.
IMPRESSION: No evidence of acute cardiopulmonary disease.

## 2015-07-15 ENCOUNTER — Encounter (INDEPENDENT_AMBULATORY_CARE_PROVIDER_SITE_OTHER): Payer: Self-pay

## 2015-10-26 ENCOUNTER — Other Ambulatory Visit (INDEPENDENT_AMBULATORY_CARE_PROVIDER_SITE_OTHER): Payer: Self-pay | Admitting: Internal Medicine

## 2015-12-17 ENCOUNTER — Other Ambulatory Visit (INDEPENDENT_AMBULATORY_CARE_PROVIDER_SITE_OTHER): Payer: Self-pay | Admitting: Internal Medicine

## 2016-02-18 ENCOUNTER — Other Ambulatory Visit (INDEPENDENT_AMBULATORY_CARE_PROVIDER_SITE_OTHER): Payer: Self-pay | Admitting: Internal Medicine

## 2016-04-02 ENCOUNTER — Other Ambulatory Visit (INDEPENDENT_AMBULATORY_CARE_PROVIDER_SITE_OTHER): Payer: Self-pay | Admitting: Internal Medicine

## 2016-04-25 ENCOUNTER — Encounter (INDEPENDENT_AMBULATORY_CARE_PROVIDER_SITE_OTHER): Payer: Self-pay | Admitting: Internal Medicine

## 2016-05-04 ENCOUNTER — Other Ambulatory Visit (INDEPENDENT_AMBULATORY_CARE_PROVIDER_SITE_OTHER): Payer: Self-pay | Admitting: Internal Medicine

## 2016-06-12 ENCOUNTER — Ambulatory Visit (INDEPENDENT_AMBULATORY_CARE_PROVIDER_SITE_OTHER): Payer: Medicare Other | Admitting: Internal Medicine

## 2016-06-12 ENCOUNTER — Encounter (INDEPENDENT_AMBULATORY_CARE_PROVIDER_SITE_OTHER): Payer: Self-pay | Admitting: Internal Medicine

## 2016-11-16 ENCOUNTER — Other Ambulatory Visit (INDEPENDENT_AMBULATORY_CARE_PROVIDER_SITE_OTHER): Payer: Self-pay | Admitting: Internal Medicine

## 2017-04-25 ENCOUNTER — Ambulatory Visit (INDEPENDENT_AMBULATORY_CARE_PROVIDER_SITE_OTHER): Payer: Medicare Other | Admitting: Otolaryngology

## 2017-04-25 DIAGNOSIS — M278 Other specified diseases of jaws: Secondary | ICD-10-CM

## 2017-04-25 DIAGNOSIS — H9202 Otalgia, left ear: Secondary | ICD-10-CM | POA: Diagnosis not present

## 2017-04-25 DIAGNOSIS — F17210 Nicotine dependence, cigarettes, uncomplicated: Secondary | ICD-10-CM | POA: Diagnosis not present

## 2017-04-25 DIAGNOSIS — M270 Developmental disorders of jaws: Secondary | ICD-10-CM | POA: Diagnosis not present

## 2017-06-13 ENCOUNTER — Ambulatory Visit (INDEPENDENT_AMBULATORY_CARE_PROVIDER_SITE_OTHER): Payer: Medicare Other | Admitting: Internal Medicine

## 2017-06-13 ENCOUNTER — Encounter (INDEPENDENT_AMBULATORY_CARE_PROVIDER_SITE_OTHER): Payer: Self-pay | Admitting: Internal Medicine

## 2017-06-13 VITALS — BP 137/90 | HR 68 | Temp 98.4°F | Resp 18 | Ht 71.5 in | Wt 211.1 lb

## 2017-06-13 DIAGNOSIS — R1013 Epigastric pain: Secondary | ICD-10-CM

## 2017-06-13 DIAGNOSIS — R109 Unspecified abdominal pain: Secondary | ICD-10-CM | POA: Diagnosis not present

## 2017-06-13 DIAGNOSIS — K7460 Unspecified cirrhosis of liver: Secondary | ICD-10-CM

## 2017-06-13 DIAGNOSIS — E039 Hypothyroidism, unspecified: Secondary | ICD-10-CM | POA: Diagnosis not present

## 2017-06-13 DIAGNOSIS — R110 Nausea: Secondary | ICD-10-CM | POA: Diagnosis not present

## 2017-06-13 LAB — COMPREHENSIVE METABOLIC PANEL
AG Ratio: 1.2 (calc) (ref 1.0–2.5)
ALT: 11 U/L (ref 6–29)
AST: 21 U/L (ref 10–35)
Albumin: 4.2 g/dL (ref 3.6–5.1)
Alkaline phosphatase (APISO): 109 U/L (ref 33–130)
BUN: 11 mg/dL (ref 7–25)
CO2: 30 mmol/L (ref 20–32)
Calcium: 9.9 mg/dL (ref 8.6–10.4)
Chloride: 102 mmol/L (ref 98–110)
Creat: 0.68 mg/dL (ref 0.50–0.99)
Globulin: 3.6 g/dL (calc) (ref 1.9–3.7)
Glucose, Bld: 114 mg/dL (ref 65–139)
Potassium: 4.1 mmol/L (ref 3.5–5.3)
Sodium: 140 mmol/L (ref 135–146)
Total Bilirubin: 0.6 mg/dL (ref 0.2–1.2)
Total Protein: 7.8 g/dL (ref 6.1–8.1)

## 2017-06-13 LAB — CBC WITH DIFFERENTIAL/PLATELET
Basophils Absolute: 73 cells/uL (ref 0–200)
Basophils Relative: 1 %
Eosinophils Absolute: 248 cells/uL (ref 15–500)
Eosinophils Relative: 3.4 %
HCT: 43.2 % (ref 35.0–45.0)
Hemoglobin: 14.6 g/dL (ref 11.7–15.5)
Lymphs Abs: 1606 cells/uL (ref 850–3900)
MCH: 29.5 pg (ref 27.0–33.0)
MCHC: 33.8 g/dL (ref 32.0–36.0)
MCV: 87.3 fL (ref 80.0–100.0)
MPV: 10.9 fL (ref 7.5–12.5)
Monocytes Relative: 5.9 %
Neutro Abs: 4942 cells/uL (ref 1500–7800)
Neutrophils Relative %: 67.7 %
Platelets: 129 10*3/uL — ABNORMAL LOW (ref 140–400)
RBC: 4.95 10*6/uL (ref 3.80–5.10)
RDW: 13.6 % (ref 11.0–15.0)
Total Lymphocyte: 22 %
WBC mixed population: 431 cells/uL (ref 200–950)
WBC: 7.3 10*3/uL (ref 3.8–10.8)

## 2017-06-13 LAB — TSH: TSH: 3.26 mIU/L (ref 0.40–4.50)

## 2017-06-13 MED ORDER — DICYCLOMINE HCL 10 MG PO CAPS: 10.0000 mg | ORAL_CAPSULE | Freq: Three times a day (TID) | ORAL | 1 refills | Status: DC | PRN

## 2017-06-13 MED ORDER — ONDANSETRON HCL 4 MG PO TABS: 4.0000 mg | ORAL_TABLET | Freq: Three times a day (TID) | ORAL | 2 refills | Status: DC | PRN

## 2017-06-13 NOTE — Patient Instructions (Signed)
Keep symptom diary as discussed. Physician will call with results of blood tests when completed. Take ondansetron every morning on schedule and subsequent doses on as-needed basis. Will request records from [facility] before further evaluation considered

## 2017-06-13 NOTE — Progress Notes (Addendum)
Presenting complaint;  Nausea and abdominal pain.  History of present illness:  Patient is 61 year old Caucasian female who is here for scheduled visit. She previously has been seen for cirrhosis and constipation. Her last visit was in September 2016. She now presents with 9 month history of nausea. She recalls nausea started in December 2017. She's had nausea virtually every day and only relief she gets is with medications. She uses ondansetron almost daily and promethazine only when ondansetron does not work. She tries to keep promethazine used a minimum. Ondansetron helps with 3-4 hours and promethazine may help for 8-12 hours. She reports nausea is intractable times when she tries to induce vomiting but has not been able to ever throw up. Her appetite is fair. She also complains of intermittent epigastric pain which may not be related to meals. She also complains of left low quadrant abdominal pain which seemed to get better with dicyclomine. She was seen at [facility] back in March 2017 when she was having upper abdominal pain as well as left lower quadrant abdominal pain. She had esophagogastroduodenoscopy and a colonoscopy. She was told she had gastric ulcer. She does not remember whether or not H. pylori testing was done. She is also found to have a polyp and advised to return in 5 years. She does not recall recommendations for follow-up exam. She also complains of generalized aches and pains felt to be due to fibromyalgia. She is on gabapentin which she believes helps great deal. She states heartburn is well controlled with therapy. She takes PPI in the morning and H2 B in the evening. She says she's been treated for oral candidiasis twice this year. She does not recall taking an antibiotic prior to this diagnosis. She has occasional headache. He has not lost any weight despite these symptoms. She also complains of worsening nausea and diaphoresis just before she has a bowel movement. She has history of  constipation and uses Linz as on as-needed basis. He denies melena or rectal bleeding. She had upper abdominal ultrasound in January 2018 and it was negative for gallstones or other abnormalities. She also had upper GI with small bowel follow-through in January 2018 and was within normal limits except prolonged small bowel transit time of 2 hours and 20 minutes. She had a HIDA scan in April 2018 but was not able to undergo CCK part. Gallbladder did visualize. She had a HIDA with CCK on 05/20/2017 and EF was normal at 81%. She states Dr. Barbie Haggis wanted to do a CT scan of abdomen and pelvis but was not authorized by her insurance.  She states she did not finish hepatitis B vaccination. She believes she had one shot but not the subsequent ones. She needs prescription for dicyclomine and ondansetron. Patient states she is trying to gradually been herself off Celexa. She states Dr. Scotty Court is aware. She does not take OTC NSAIDs.    Current Medications: Outpatient Encounter Prescriptions as of 06/13/2017  Medication Sig  . ALPHA-LIPOIC ACID PO Take 800 mg by mouth daily.  . budesonide-formoterol (SYMBICORT) 160-4.5 MCG/ACT inhaler Inhale 1 puff into the lungs daily as needed (FOR SHORTNESS OF BREATH).   . citalopram (CELEXA) 20 MG tablet Take 20 mg by mouth daily.   Marland Kitchen dicyclomine (BENTYL) 10 MG capsule Take 1 capsule (10 mg total) by mouth 3 (three) times daily as needed for spasms. (Patient taking differently: Take 10 mg by mouth as needed for spasms. )  . fentaNYL (DURAGESIC - DOSED MCG/HR) 50 MCG/HR Place  50 mcg onto the skin every 3 (three) days.  Marland Kitchen gabapentin (NEURONTIN) 300 MG capsule Take 1,600 mg by mouth 3 (three) times daily.   Marland Kitchen levothyroxine (SYNTHROID, LEVOTHROID) 75 MCG tablet Take 75 mcg by mouth daily before breakfast.  . LINZESS 290 MCG CAPS capsule TAKE ONE CAPSULE BY MOUTH ONCE DAILY  . lisinopril-hydrochlorothiazide (PRINZIDE,ZESTORETIC) 20-25 MG per tablet Take 1 tablet by mouth  daily.  . methocarbamol (ROBAXIN) 750 MG tablet Take 325-750 mg by mouth every 6 (six) hours as needed for muscle spasms.   Marland Kitchen omeprazole (PRILOSEC) 40 MG capsule TAKE 1 CAPSULE BY MOUTH IN THE MORNING ON  EMPTY  STOMACH  FOR  ULCER,  WAIT  45  MINUTES  BEFORE  EATING  FOR  30  DAYS  . ondansetron (ZOFRAN) 4 MG tablet Take 4 mg by mouth daily. Patient takes in the morning.  Marland Kitchen oxyCODONE (OXY IR/ROXICODONE) 5 MG immediate release tablet Take 5-10 mg by mouth every 8 (eight) hours as needed for moderate pain or severe pain.   . promethazine (PHENERGAN) 25 MG tablet Take 25 mg by mouth as needed.   . ranitidine (ZANTAC) 300 MG capsule Take 300 mg by mouth every evening.   . sertraline (ZOLOFT) 100 MG tablet Take 100 mg by mouth daily.   Marland Kitchen triamcinolone (NASACORT) 55 MCG/ACT AERO nasal inhaler Place 2 sprays into the nose daily as needed (FOR ALLERGIES).  . vitamin B-12 (CYANOCOBALAMIN) 500 MCG tablet Take 500 mcg by mouth 2 (two) times daily.  . [DISCONTINUED] metoprolol tartrate (LOPRESSOR) 25 MG tablet Take 25 mg by mouth 2 (two) times daily.   No facility-administered encounter medications on file as of 06/13/2017.    Past medical history:  Remote history of thyrotoxicosis responding to medical therapy and now she has developed hypothyroidism. Chronic hepatitis C for which he was seen by me and referred to [facility] and underwent successful therapy in 2011. He has cirrhosis possibly secondary to fatty liver and prior hepatitis C. IBS with constipation and left-sided abdominal pain. Hypertension. Obesity. She has had weight problems for over 20 years. Coronary artery disease. She had stenting to RCA in January 2010. Diabetes mellitus. Anxiety and Depression for 25 years. History of kidney stones. Hypothyroidism. COPD diagnosed 2 months ago. Fibromyalgia was diagnosed in January this year.. Status post parathyroidectomy about 11 years ago. C-section. Gastric ulcer diagnosed on EGD of March 2017  at [facility]. Colonoscopy in March 2017 also at [facility] with removal of single polyp.   Allergies: Allergies  Allergen Reactions  . Ampicillin Hives  . Ciprofloxacin Hives  . Sulfamethoxazole Hives     Family history:  Father died at age 67 of ruptured AAA. Mother is in good health. She is accompanying her today. She has 2 brothers in good health.   Social history:  She is divorced. She has son age 21 and daughter age 69 both in good health. She is disabled. She is living with her daughter. He used to work as a Psychiatric nurse. She smokes about a pack a day which she has been doing for 44 years. She has never drank alcohol.  Physical examination.: Blood pressure 137/90, pulse 68, temperature 98.4 F (36.9 C), temperature source Oral, resp. rate 18, height 5' 11.5" (1.816 m), weight 211 lb 1.6 oz (95.8 kg). Patient is alert and in no acute distress. Conjunctiva is pink. Sclera is nonicteric Oropharyngeal mucosa is normal. No neck masses or thyromegaly noted. Cardiac exam with regular rhythm normal S1 and S2. No  murmur or gallop noted. Lungs are clear to auscultation. Abdomen is full. Bowel sounds are normal. No bruits noted. Abdomen is soft with tenderness in left lower and left upper quadrant on superficial palpation. She has mild to moderate midepigastric tenderness. No organomegaly or masses. No LE edema or clubbing noted.  Labs/studies Results:   no recent lab studies available.  Assessment:  #1. Chronic nausea without vomiting. She is getting some relief with symptomatic therapy. Differential diagnosis includes drug-induced nausea and most likely culprit would be gabapentin which she is taking it fairly high dose, peptic ulcer disease or nausea could be manifestation of depression and anxiety. She was diagnosed with gastric ulcer in March 2017 but did not undergo follow-up examination. She had upper GI series in January this year. This study was normal but a small ulcer could be easily  missed. Finally she could also have gastroparesis. Before testing undertaken for gastroparesis we need to make sure peptic ulcer is healed completely.  #2. Epigastric pain. This pain may be due to peptic ulcer disease chronic dyspepsia or IBS. Gallbladder testing negative for cholelithiasis or biliary dyskinesia.  #3. IBS/constipation. She will try Linzess at a lower dose but take it every day or every other day rather than on when necessary basis. Left-sided abdominal pain appears to be both due to IBS and fibromyalgia as most of her pain appears to be wall pain.  #4. History of hypothyroidism. Will check TSH along with other blood work.  #5. History of cirrhosis presumably secondary to hepatitis C which has been successfully treated over 7 years ago. She remains with well preserved hepatic function. Ultrasound in January 2018 was negative for focal lesions. She needs to undergo periodic [place] screening. She should also talk with Dr. August Albino about hepatitis B vaccination.   Plan:  Will obtain records of EGD and colonoscopy from [facility]. If indeed she had gastric ulcer and prior EGD she will need to follow-up endoscopic evaluation. Patient will go to the lab for CBC with differential comprehensive chemistry panel and TSH. Patient counseled for the need to quit cigarette smoking. Patient advised to take ondansetron 4 mg every morning and take subsequent doses on as-needed basis. She can try Linzess at dose of 145 g by mouth every morning. Patient also advised to keep symptom diary as to nausea and abdominal pain. Further recommendations to follow.   Addendum; EGD and colonoscopy procedure note reviewed. These procedures were done on 10/20/2015 by Dr. Gardiner Ramus of [facility]. She had 5 x 9 mm gastric ulcer at antrum. Biopsy was negative. H. pylori stains were also negative. Colonic polyp is hyperplastic. Will proceed with diagnostic esophagogastroduodenoscopy.

## 2017-06-17 ENCOUNTER — Other Ambulatory Visit (INDEPENDENT_AMBULATORY_CARE_PROVIDER_SITE_OTHER): Payer: Self-pay | Admitting: Internal Medicine

## 2017-06-17 ENCOUNTER — Encounter (INDEPENDENT_AMBULATORY_CARE_PROVIDER_SITE_OTHER): Payer: Self-pay | Admitting: *Deleted

## 2017-06-17 DIAGNOSIS — R110 Nausea: Secondary | ICD-10-CM

## 2017-06-17 DIAGNOSIS — R109 Unspecified abdominal pain: Secondary | ICD-10-CM

## 2017-06-18 ENCOUNTER — Other Ambulatory Visit (INDEPENDENT_AMBULATORY_CARE_PROVIDER_SITE_OTHER): Payer: Self-pay | Admitting: *Deleted

## 2017-06-18 MED ORDER — LINACLOTIDE 145 MCG PO CAPS: 145.0000 ug | ORAL_CAPSULE | Freq: Every day | ORAL | 5 refills | Status: DC

## 2017-06-18 NOTE — Telephone Encounter (Signed)
Patient was recently seen in the office . She shared that the Linzess 290 mcg was to strong and ask if she could try a lower dose. Dr.Rehman stated that the patient may try the 145 mcg, we failed to send it in to her pharmacy at the time of her OV. Patient was left a message that we had sent a new prescription in for her this morning.

## 2017-07-02 NOTE — Patient Instructions (Signed)
"     Teresa Benitez  07/02/2017     @PREFPERIOPPHARMACY @   Your procedure is scheduled on  07/10/2017   Report to [facility] at  615  A.M.  Call this number if you have problems the morning of surgery:  [phone]   Remember:  Do not eat food or drink liquids after midnight.  Take these medicines the morning of surgery with A SIP OF WATER  Celexa, bentyl, neurontin, levothyroxine, lisinopril, robaxin, prilosec or zantac, zofran or phenergan, oxycodone, zoloft. Use your inhaler before you come.    Do not wear jewelry, make-up or nail polish.  Do not wear lotions, powders, or perfumes, or deoderant.  Do not shave 48 hours prior to surgery.  Men may shave face and neck.  Do not bring valuables to the hospital.  [facility] is not responsible for any belongings or valuables.  Contacts, dentures or bridgework may not be worn into surgery.  Leave your suitcase in the car.  After surgery it may be brought to your room.  For patients admitted to the hospital, discharge time will be determined by your treatment team.  Patients discharged the day of surgery will not be allowed to drive home.   Name and phone number of your driver:   family Special instructions:  Follow the diet instructions given to you by Dr Olevia Perches office.  Please read over the following fact sheets that you were given. Surgical Site Infection Prevention, Anesthesia Post-op Instructions and Care and Recovery After Surgery      Esophagogastroduodenoscopy Esophagogastroduodenoscopy (EGD) is a procedure to examine the lining of the esophagus, stomach, and first part of the small intestine (duodenum). This procedure is done to check for problems such as inflammation, bleeding, ulcers, or growths. During this procedure, a long, flexible, lighted tube with a camera attached (endoscope) is inserted down the throat. Tell a health care provider about:  Any allergies you have.  All medicines you are taking,  including vitamins, herbs, eye drops, creams, and over-the-counter medicines.  Any problems you or family members have had with anesthetic medicines.  Any blood disorders you have.  Any surgeries you have had.  Any medical conditions you have.  Whether you are pregnant or may be pregnant. What are the risks? Generally, this is a safe procedure. However, problems may occur, including:  Infection.  Bleeding.  A tear (perforation) in the esophagus, stomach, or duodenum.  Trouble breathing.  Excessive sweating.  Spasms of the larynx.  A slowed heartbeat.  Low blood pressure.  What happens before the procedure?  Follow instructions from your health care provider about eating or drinking restrictions.  Ask your health care provider about: ? Changing or stopping your regular medicines. This is especially important if you are taking diabetes medicines or blood thinners. ? Taking medicines such as aspirin and ibuprofen. These medicines can thin your blood. Do not take these medicines before your procedure if your health care provider instructs you not to.  Plan to have someone take you home after the procedure.  If you wear dentures, be ready to remove them before the procedure. What happens during the procedure?  To reduce your risk of infection, your health care team will wash or sanitize their hands.  An IV tube will be put in a vein in your hand or arm. You will get medicines and fluids through this tube.  You will be given one or more of the following: ? A medicine to help  you relax (sedative). ? A medicine to numb the area (local anesthetic). This medicine may be sprayed into your throat. It will make you feel more comfortable and keep you from gagging or coughing during the procedure. ? A medicine for pain.  A mouth guard may be placed in your mouth to protect your teeth and to keep you from biting on the endoscope.  You will be asked to lie on your left  side.  The endoscope will be lowered down your throat into your esophagus, stomach, and duodenum.  Air will be put into the endoscope. This will help your health care provider see better.  The lining of your esophagus, stomach, and duodenum will be examined.  Your health care provider may: ? Take a tissue sample so it can be looked at in a lab (biopsy). ? Remove growths. ? Remove objects (foreign bodies) that are stuck. ? Treat any bleeding with medicines or other devices that stop tissue from bleeding. ? Widen (dilate) or stretch narrowed areas of your esophagus and stomach.  The endoscope will be taken out. The procedure may vary among health care providers and hospitals. What happens after the procedure?  Your blood pressure, heart rate, breathing rate, and blood oxygen level will be monitored often until the medicines you were given have worn off.  Do not eat or drink anything until the numbing medicine has worn off and your gag reflex has returned. This information is not intended to replace advice given to you by your health care provider. Make sure you discuss any questions you have with your health care provider. Document Released: 01/11/2005 Document Revised: 02/16/2016 Document Reviewed: 08/04/2015 Elsevier Interactive Patient Education  2018 [employer]. Esophagogastroduodenoscopy, Care After Refer to this sheet in the next few weeks. These instructions provide you with information about caring for yourself after your procedure. Your health care provider may also give you more specific instructions. Your treatment has been planned according to current medical practices, but problems sometimes occur. Call your health care provider if you have any problems or questions after your procedure. What can I expect after the procedure? After the procedure, it is common to have:  A sore throat.  Nausea.  Bloating.  Dizziness.  Fatigue.  Follow these instructions at  home:  Do not eat or drink anything until the numbing medicine (local anesthetic) has worn off and your gag reflex has returned. You will know that the local anesthetic has worn off when you can swallow comfortably.  Do not drive for 24 hours if you received a medicine to help you relax (sedative).  If your health care provider took a tissue sample for testing during the procedure, make sure to get your test results. This is your responsibility. Ask your health care provider or the department performing the test when your results will be ready.  Keep all follow-up visits as told by your health care provider. This is important. Contact a health care provider if:  You cannot stop coughing.  You are not urinating.  You are urinating less than usual. Get help right away if:  You have trouble swallowing.  You cannot eat or drink.  You have throat or chest pain that gets worse.  You are dizzy or light-headed.  You faint.  You have nausea or vomiting.  You have chills.  You have a fever.  You have severe abdominal pain.  You have black, tarry, or bloody stools. This information is not intended to replace advice given to  you by your health care provider. Make sure you discuss any questions you have with your health care provider. Document Released: 08/27/2012 Document Revised: 02/16/2016 Document Reviewed: 08/04/2015 Elsevier Interactive Patient Education  2018 [place] Anesthesia is a term that refers to techniques, procedures, and medicines that help a person stay safe and comfortable during a medical procedure. Monitored anesthesia care, or sedation, is one type of anesthesia. Your anesthesia specialist may recommend sedation if you will be having a procedure that does not require you to be unconscious, such as:  Cataract surgery.  A dental procedure.  A biopsy.  A colonoscopy.  During the procedure, you may receive a medicine to help  you relax (sedative). There are three levels of sedation:  Mild sedation. At this level, you may feel awake and relaxed. You will be able to follow directions.  Moderate sedation. At this level, you will be sleepy. You may not remember the procedure.  Deep sedation. At this level, you will be asleep. You will not remember the procedure.  The more medicine you are given, the deeper your level of sedation will be. Depending on how you respond to the procedure, the anesthesia specialist may change your level of sedation or the type of anesthesia to fit your needs. An anesthesia specialist will monitor you closely during the procedure. Let your health care provider know about:  Any allergies you have.  All medicines you are taking, including vitamins, herbs, eye drops, creams, and over-the-counter medicines.  Any use of steroids (by mouth or as a cream).  Any problems you or family members have had with sedatives and anesthetic medicines.  Any blood disorders you have.  Any surgeries you have had.  Any medical conditions you have, such as sleep apnea.  Whether you are pregnant or may be pregnant.  Any use of cigarettes, alcohol, or street drugs. What are the risks? Generally, this is a safe procedure. However, problems may occur, including:  Getting too much medicine (oversedation).  Nausea.  Allergic reaction to medicines.  Trouble breathing. If this happens, a breathing tube may be used to help with breathing. It will be removed when you are awake and breathing on your own.  Heart trouble.  Lung trouble.  Before the procedure Staying hydrated Follow instructions from your health care provider about hydration, which may include:  Up to 2 hours before the procedure - you may continue to drink clear liquids, such as water, clear fruit juice, black coffee, and plain tea.  Eating and drinking restrictions Follow instructions from your health care provider about eating and  drinking, which may include:  8 hours before the procedure - stop eating heavy meals or foods such as meat, fried foods, or fatty foods.  6 hours before the procedure - stop eating light meals or foods, such as toast or cereal.  6 hours before the procedure - stop drinking milk or drinks that contain milk.  2 hours before the procedure - stop drinking clear liquids.  Medicines Ask your health care provider about:  Changing or stopping your regular medicines. This is especially important if you are taking diabetes medicines or blood thinners.  Taking medicines such as aspirin and ibuprofen. These medicines can thin your blood. Do not take these medicines before your procedure if your health care provider instructs you not to.  Tests and exams  You will have a physical exam.  You may have blood tests done to show: ? How well your  kidneys and liver are working. ? How well your blood can clot.  General instructions  Plan to have someone take you home from the hospital or clinic.  If you will be going home right after the procedure, plan to have someone with you for 24 hours.  What happens during the procedure?  Your blood pressure, heart rate, breathing, level of pain and overall condition will be monitored.  An IV tube will be inserted into one of your veins.  Your anesthesia specialist will give you medicines as needed to keep you comfortable during the procedure. This may mean changing the level of sedation.  The procedure will be performed. After the procedure  Your blood pressure, heart rate, breathing rate, and blood oxygen level will be monitored until the medicines you were given have worn off.  Do not drive for 24 hours if you received a sedative.  You may: ? Feel sleepy, clumsy, or nauseous. ? Feel forgetful about what happened after the procedure. ? Have a sore throat if you had a breathing tube during the procedure. ? Vomit. This information is not intended  to replace advice given to you by your health care provider. Make sure you discuss any questions you have with your health care provider. Document Released: 06/06/2005 Document Revised: 02/17/2016 Document Reviewed: 01/01/2016 Elsevier Interactive Patient Education  2018 [place], Care After These instructions provide you with information about caring for yourself after your procedure. Your health care provider may also give you more specific instructions. Your treatment has been planned according to current medical practices, but problems sometimes occur. Call your health care provider if you have any problems or questions after your procedure. What can I expect after the procedure? After your procedure, it is common to:  Feel sleepy for several hours.  Feel clumsy and have poor balance for several hours.  Feel forgetful about what happened after the procedure.  Have poor judgment for several hours.  Feel nauseous or vomit.  Have a sore throat if you had a breathing tube during the procedure.  Follow these instructions at home: For at least 24 hours after the procedure:   Do not: ? Participate in activities in which you could fall or become injured. ? Drive. ? Use heavy machinery. ? Drink alcohol. ? Take sleeping pills or medicines that cause drowsiness. ? Make important decisions or sign legal documents. ? Take care of children on your own.  Rest. Eating and drinking  Follow the diet that is recommended by your health care provider.  If you vomit, drink water, juice, or soup when you can drink without vomiting.  Make sure you have little or no nausea before eating solid foods. General instructions  Have a responsible adult stay with you until you are awake and alert.  Take over-the-counter and prescription medicines only as told by your health care provider.  If you smoke, do not smoke without supervision.  Keep all follow-up visits  as told by your health care provider. This is important. Contact a health care provider if:  You keep feeling nauseous or you keep vomiting.  You feel light-headed.  You develop a rash.  You have a fever. Get help right away if:  You have trouble breathing. This information is not intended to replace advice given to you by your health care provider. Make sure you discuss any questions you have with your health care provider. Document Released: 01/01/2016 Document Revised: 05/02/2016 Document Reviewed: 01/01/2016 Elsevier Interactive Patient  Education  2018 [employer] Inc[employer]  

## 2017-07-05 ENCOUNTER — Encounter (HOSPITAL_COMMUNITY)
Admission: RE | Admit: 2017-07-05 | Discharge: 2017-07-05 | Disposition: A | Discharge status: Home / Self Care | Payer: Medicare Other | Source: Ambulatory Visit | Attending: Internal Medicine | Admitting: Internal Medicine

## 2017-07-05 ENCOUNTER — Encounter (HOSPITAL_COMMUNITY): Payer: Self-pay

## 2017-07-08 ENCOUNTER — Other Ambulatory Visit (INDEPENDENT_AMBULATORY_CARE_PROVIDER_SITE_OTHER): Payer: Self-pay | Admitting: Internal Medicine

## 2017-07-08 ENCOUNTER — Encounter (INDEPENDENT_AMBULATORY_CARE_PROVIDER_SITE_OTHER): Payer: Self-pay | Admitting: *Deleted

## 2017-07-08 DIAGNOSIS — R110 Nausea: Secondary | ICD-10-CM

## 2017-07-08 DIAGNOSIS — R109 Unspecified abdominal pain: Secondary | ICD-10-CM

## 2017-07-08 SURGERY — Surgical Case
Anesthesia: *Unknown

## 2017-07-11 ENCOUNTER — Encounter (INDEPENDENT_AMBULATORY_CARE_PROVIDER_SITE_OTHER): Payer: Self-pay | Admitting: Internal Medicine

## 2017-07-11 ENCOUNTER — Other Ambulatory Visit (INDEPENDENT_AMBULATORY_CARE_PROVIDER_SITE_OTHER): Payer: Self-pay | Admitting: *Deleted

## 2017-07-11 ENCOUNTER — Ambulatory Visit (INDEPENDENT_AMBULATORY_CARE_PROVIDER_SITE_OTHER): Payer: Medicare Other | Admitting: Internal Medicine

## 2017-07-11 VITALS — BP 140/96 | HR 78 | Temp 98.8°F | Resp 18 | Ht 71.5 in | Wt 209.4 lb

## 2017-07-11 DIAGNOSIS — K5900 Constipation, unspecified: Secondary | ICD-10-CM

## 2017-07-11 DIAGNOSIS — R300 Dysuria: Secondary | ICD-10-CM

## 2017-07-11 DIAGNOSIS — R1013 Epigastric pain: Secondary | ICD-10-CM | POA: Diagnosis not present

## 2017-07-11 DIAGNOSIS — R110 Nausea: Secondary | ICD-10-CM | POA: Diagnosis not present

## 2017-07-11 DIAGNOSIS — R109 Unspecified abdominal pain: Secondary | ICD-10-CM | POA: Diagnosis not present

## 2017-07-11 MED ORDER — BISACODYL 10 MG RE SUPP: 10.0000 mg | Freq: Every day | RECTAL | 1 refills | Status: DC | PRN

## 2017-07-11 MED ORDER — BISACODYL 10 MG RE SUPP: 10.0000 mg | RECTAL | 0 refills | Status: DC | PRN

## 2017-07-11 MED ORDER — FLEET ENEMA 7-19 GM/118ML RE ENEM: 1.0000 | ENEMA | Freq: Every day | RECTAL | 0 refills | Status: DC | PRN

## 2017-07-11 MED ORDER — LINACLOTIDE 72 MCG PO CAPS: 72.0000 ug | ORAL_CAPSULE | Freq: Every day | ORAL | 0 refills | Status: DC

## 2017-07-11 NOTE — Progress Notes (Signed)
Presenting complaint;  Follow-up for nausea abdominal pain and constipation.  Subjective:  Patient is 61 year old Caucasian female who is here for scheduled visit accompanied by her mother. She was last seen 4 weeks ago. Records from [facility] were reviewed.she had EGD and colonoscopy in January 2017 by Dr. Jerene Pitch. She had 5 x 9 mm gastric ulcer at antrum. Biopsy revealed benign etiology and H. Pylori stain was negative.colonoscopy revealed single polyp and was hyperplastic.I recommended esophagogastroduodenoscopy but patient could not schedule this until next month. She has kept stool diary. She is still having difficulty with bowel movements. She has been using suppository with minimal success. She has good bowel movement no more than once a week. Over the weekend she had explosive bowel movement which she could not control. She states she was at home. She says nausea occurs primarily before during and after bowel movement. She feels she is eating better. She denies vomiting. She complains of left flank pain radiating to left groin. She is concerned that she is having renal colic. However she has not followed up with PCP.she states she has been having night sweats and chills for the last 7 days. She has not checked her temperature. She also complains of dysuria. She denies hematuria. She is taking naproxen no more than twice a week.  She has taken Linzess 290 g but it resulted in uncontrolled diarrhea.she feels current dose is not working.   Current Medications: Outpatient Encounter Prescriptions as of 07/11/2017  Medication Sig  . alum & mag hydroxide-simeth (MAALOX/MYLANTA) 200-200-20 MG/5ML suspension Take 15 mLs by mouth every 6 (six) hours as needed for indigestion or heartburn.  . budesonide-formoterol (SYMBICORT) 160-4.5 MCG/ACT inhaler Inhale 1 puff into the lungs daily as needed (FOR SHORTNESS OF BREATH).   Marland Kitchen dicyclomine (BENTYL) 10 MG capsule Take 1 capsule (10 mg total) by mouth 3 (three)  times daily as needed for spasms.  . fentaNYL (DURAGESIC - DOSED MCG/HR) 50 MCG/HR Place 50 mcg onto the skin every 3 (three) days.  Marland Kitchen gabapentin (NEURONTIN) 300 MG capsule Take 1,600 mg by mouth 3 (three) times daily.   Marland Kitchen levothyroxine (SYNTHROID, LEVOTHROID) 75 MCG tablet Take 75 mcg by mouth daily before breakfast.  . linaclotide (LINZESS) 145 MCG CAPS capsule Take 1 capsule (145 mcg total) by mouth daily before breakfast.  . lisinopril-hydrochlorothiazide (PRINZIDE,ZESTORETIC) 20-25 MG per tablet Take 1 tablet by mouth daily.  . methocarbamol (ROBAXIN) 500 MG tablet Take 500-750 mg by mouth 2 (two) times daily as needed for muscle spasms (takes 1 tablet in the morning and 1.5 tablet at bedtime). Takes a dose every morning and another dose at bedtime only if needed for pain  . naproxen sodium (ANAPROX) 220 MG tablet Take 440 mg by mouth daily as needed (pain).  Marland Kitchen omeprazole (PRILOSEC) 40 MG capsule TAKE 1 CAPSULE BY MOUTH IN THE MORNING ON  EMPTY  STOMACH  FOR  ULCER,  WAIT  45  MINUTES  BEFORE  EATING  FOR  30  DAYS  . ondansetron (ZOFRAN) 4 MG tablet Take 1 tablet (4 mg total) by mouth every 8 (eight) hours as needed for nausea or vomiting. Patient takes in the morning.  Marland Kitchen oxyCODONE (OXY IR/ROXICODONE) 5 MG immediate release tablet Take 5-15 mg by mouth every 8 (eight) hours as needed for moderate pain or severe pain (depends on pain level if takes 1-3 tablets).   . promethazine (PHENERGAN) 25 MG tablet Take 25 mg by mouth every 8 (eight) hours as needed for nausea  or vomiting (only takes when zofran does not work).   . pseudoephedrine-acetaminophen (TYLENOL SINUS) 30-500 MG TABS tablet Take 2 tablets by mouth 2 (two) times daily as needed (sinus pressure).  . ranitidine (ZANTAC) 300 MG capsule Take 300 mg by mouth every evening.   . sertraline (ZOLOFT) 100 MG tablet Take 50 mg by mouth daily. Takes 0.5 tablet  . triamcinolone (NASACORT) 55 MCG/ACT AERO nasal inhaler Place 2 sprays into the nose  daily as needed (FOR ALLERGIES).  . vitamin B-12 (CYANOCOBALAMIN) 500 MCG tablet Take 1,000 mcg by mouth daily.   . [DISCONTINUED] Bisacodyl (DULCOLAX PO) Take by mouth as needed.  . [DISCONTINUED] glycerin adult 2 g suppository Place 1 suppository rectally as needed for constipation.  .    . bisacodyl (DULCOLAX) 10 MG suppository Place 1 suppository (10 mg total) rectally daily as needed.  .    .    . [DISCONTINUED] LINZESS 290 MCG CAPS capsule TAKE ONE CAPSULE BY MOUTH ONCE DAILY (Patient not taking: Reported on 07/02/2017)   No facility-administered encounter medications on file as of 07/11/2017.      Objective: Blood pressure (!) 140/96, pulse 78, temperature 98.8 F (37.1 C), temperature source Oral, resp. rate 18, height 5' 11.5" (1.816 m), weight 209 lb 6.4 oz (95 kg). Patient is alert and in no acutedistress. Conjunctiva is pink. Sclera is nonicteric Oropharyngeal mucosa is normal. No neck masses or thyromegaly noted. Cardiac exam with regular rhythm normal S1 and S2. No murmur or gallop noted. Lungs are clear to auscultation. Abdomen is full. Bowel sounds are normal. She has mild to moderate tenderness in left lower left upper quadrant both in superficial and deep palpation. No organomegaly or masses noted. She has tenderness over left renal angle on superficial palpation. No LE edema or clubbing noted.  Labs/studies Results: Lab data from 06/13/2017 CBC 7.3, H&H 14.6 and 43.2, platelet count 129K  Glucose 114. BUN 11 and creatinine 0.68 Serum sodium 140 potassium 4.1 chloride 102 CO2 30 Serum calcium 9.9. Bilirubin 0.6, AP 109, AST 21, ALT 11 total protein 7.8 and albumin 4.2.  TSH 3.26  Review of abdominopelvic CT from 06/22/2015 reveals left but no evidence of hydronephris  Assessment:  #1. Nausea without vomiting.she has nausea primarily before during and after bowel movement. This would appear to be vasovagal phenomenon. She may also have peptic ulcer disease.  She is scheduled to undergo EGD within the next few weeks. Procedure delayed per patient request.she is responding to ondansetron and using promethazine occasionally.  #2. Epigastric pain. She has history of gastric ulcer diagnosed in January 2017. She has not undergone follow-up examination to document healing. She has decrease NSAID use.I'm concerned that she has nonhealing gastric ulcer. She is scheduled for EGD in about 3 weeks. She will continue PPI.  #3. Left-sided abdominal pain appears to be due to IBS and constipation. Some of this pain may be due to back problems or urolithiasis.  #4. Chronic constipation. Constipation is primarily due to her medications. Response to therapy is not satisfactory. She may benefit from higher dose of Linzess.  #5. Dysuria. She may have cystitis.  #6.chronic thrombocytopenia secondary to known cirrhosis but hepatic function is well-preserved.   Plan:  Discontinue glycerin suppository. Urinalysis culture if she has WBCs. Increase Linzess dose to 145+ 72 g daily. She has prescription for 145 g tablets. She was given 30 day supply of 72 g pill samples. Use Dulcolax suppository daily or as needed. Use fleets enema if suppository  does not work. Keep NSAID use minimum. She will also need to follow-up with urologist because of history of urolithiasis. EGD in 3 weeks.

## 2017-07-11 NOTE — Patient Instructions (Signed)
Use fleet enema if suppository not effective.

## 2017-07-12 ENCOUNTER — Other Ambulatory Visit (INDEPENDENT_AMBULATORY_CARE_PROVIDER_SITE_OTHER): Payer: Self-pay | Admitting: Internal Medicine

## 2017-07-12 LAB — URINALYSIS, ROUTINE W REFLEX MICROSCOPIC
Bilirubin Urine: NEGATIVE
Glucose, UA: NEGATIVE
Hgb urine dipstick: NEGATIVE
Ketones, ur: NEGATIVE
Nitrite: POSITIVE — AB
Protein, ur: NEGATIVE
Specific Gravity, Urine: 1.014 (ref 1.001–1.03)
Squamous Epithelial / LPF: >=28 /HPF — AB (ref <=5)
pH: 8 (ref 5.0–8.0)

## 2017-07-12 MED ORDER — NITROFURANTOIN MONOHYD MACRO 100 MG PO CAPS: 100.0000 mg | ORAL_CAPSULE | Freq: Two times a day (BID) | ORAL | 0 refills | Status: DC

## 2017-08-09 NOTE — Patient Instructions (Signed)
Teresa Benitez  08/09/2017     @PREFPERIOPPHARMACY @   Your procedure is scheduled on  08/23/2017   Report to [facility] at  615   A.M.  Call this number if you have problems the morning of surgery:  [phone]   Remember:  Do not eat food or drink liquids after midnight.  Take these medicines the morning of surgery with A SIP OF WATER  Neurontin, synthroid, lisinopril, robaxin, prilosec, zofran or phenergan, oxycodone, zantac, zoloft.   Do not wear jewelry, make-up or nail polish.  Do not wear lotions, powders, or perfumes, or deoderant.  Do not shave 48 hours prior to surgery.  Men may shave face and neck.  Do not bring valuables to the hospital.  [facility] is not responsible for any belongings or valuables.  Contacts, dentures or bridgework may not be worn into surgery.  Leave your suitcase in the car.  After surgery it may be brought to your room.  For patients admitted to the hospital, discharge time will be determined by your treatment team.  Patients discharged the day of surgery will not be allowed to drive home.   Name and phone number of your driver:   family Special instructions:  Follow the diet and prep instructions given to you by Dr Olevia Perches office.  Please read over the following fact sheets that you were given. Anesthesia Post-op Instructions and Care and Recovery After Surgery      Esophagogastroduodenoscopy, Care After Refer to this sheet in the next few weeks. These instructions provide you with information about caring for yourself after your procedure. Your health care provider may also give you more specific instructions. Your treatment has been planned according to current medical practices, but problems sometimes occur. Call your health care provider if you have any problems or questions after your procedure. What can I expect after the procedure? After the procedure, it is common to have:  A sore  throat.  Nausea.  Bloating.  Dizziness.  Fatigue.  Follow these instructions at home:  Do not eat or drink anything until the numbing medicine (local anesthetic) has worn off and your gag reflex has returned. You will know that the local anesthetic has worn off when you can swallow comfortably.  Do not drive for 24 hours if you received a medicine to help you relax (sedative).  If your health care provider took a tissue sample for testing during the procedure, make sure to get your test results. This is your responsibility. Ask your health care provider or the department performing the test when your results will be ready.  Keep all follow-up visits as told by your health care provider. This is important. Contact a health care provider if:  You cannot stop coughing.  You are not urinating.  You are urinating less than usual. Get help right away if:  You have trouble swallowing.  You cannot eat or drink.  You have throat or chest pain that gets worse.  You are dizzy or light-headed.  You faint.  You have nausea or vomiting.  You have chills.  You have a fever.  You have severe abdominal pain.  You have black, tarry, or bloody stools. This information is not intended to replace advice given to you by your health care provider. Make sure you discuss any questions you have with your health care provider. Document Released: 08/27/2012 Document Revised: 02/16/2016 Document Reviewed: 08/04/2015 Elsevier Interactive Patient Education  [employer].  Esophagogastroduodenoscopy Esophagogastroduodenoscopy (EGD) is a procedure to examine the lining of the esophagus, stomach, and first part of the small intestine (duodenum). This procedure is done to check for problems such as inflammation, bleeding, ulcers, or growths. During this procedure, a long, flexible, lighted tube with a camera attached (endoscope) is inserted down the throat. Tell a health care provider  about:  Any allergies you have.  All medicines you are taking, including vitamins, herbs, eye drops, creams, and over-the-counter medicines.  Any problems you or family members have had with anesthetic medicines.  Any blood disorders you have.  Any surgeries you have had.  Any medical conditions you have.  Whether you are pregnant or may be pregnant. What are the risks? Generally, this is a safe procedure. However, problems may occur, including:  Infection.  Bleeding.  A tear (perforation) in the esophagus, stomach, or duodenum.  Trouble breathing.  Excessive sweating.  Spasms of the larynx.  A slowed heartbeat.  Low blood pressure.  What happens before the procedure?  Follow instructions from your health care provider about eating or drinking restrictions.  Ask your health care provider about: ? Changing or stopping your regular medicines. This is especially important if you are taking diabetes medicines or blood thinners. ? Taking medicines such as aspirin and ibuprofen. These medicines can thin your blood. Do not take these medicines before your procedure if your health care provider instructs you not to.  Plan to have someone take you home after the procedure.  If you wear dentures, be ready to remove them before the procedure. What happens during the procedure?  To reduce your risk of infection, your health care team will wash or sanitize their hands.  An IV tube will be put in a vein in your hand or arm. You will get medicines and fluids through this tube.  You will be given one or more of the following: ? A medicine to help you relax (sedative). ? A medicine to numb the area (local anesthetic). This medicine may be sprayed into your throat. It will make you feel more comfortable and keep you from gagging or coughing during the procedure. ? A medicine for pain.  A mouth guard may be placed in your mouth to protect your teeth and to keep you from biting on  the endoscope.  You will be asked to lie on your left side.  The endoscope will be lowered down your throat into your esophagus, stomach, and duodenum.  Air will be put into the endoscope. This will help your health care provider see better.  The lining of your esophagus, stomach, and duodenum will be examined.  Your health care provider may: ? Take a tissue sample so it can be looked at in a lab (biopsy). ? Remove growths. ? Remove objects (foreign bodies) that are stuck. ? Treat any bleeding with medicines or other devices that stop tissue from bleeding. ? Widen (dilate) or stretch narrowed areas of your esophagus and stomach.  The endoscope will be taken out. The procedure may vary among health care providers and hospitals. What happens after the procedure?  Your blood pressure, heart rate, breathing rate, and blood oxygen level will be monitored often until the medicines you were given have worn off.  Do not eat or drink anything until the numbing medicine has worn off and your gag reflex has returned. This information is not intended to replace advice given to you by your health care provider. Make sure  you discuss any questions you have with your health care provider. Document Released: 01/11/2005 Document Revised: 02/16/2016 Document Reviewed: 08/04/2015 Elsevier Interactive Patient Education  2018 [place] Anesthesia is a term that refers to techniques, procedures, and medicines that help a person stay safe and comfortable during a medical procedure. Monitored anesthesia care, or sedation, is one type of anesthesia. Your anesthesia specialist may recommend sedation if you will be having a procedure that does not require you to be unconscious, such as:  Cataract surgery.  A dental procedure.  A biopsy.  A colonoscopy.  During the procedure, you may receive a medicine to help you relax (sedative). There are three levels of  sedation:  Mild sedation. At this level, you may feel awake and relaxed. You will be able to follow directions.  Moderate sedation. At this level, you will be sleepy. You may not remember the procedure.  Deep sedation. At this level, you will be asleep. You will not remember the procedure.  The more medicine you are given, the deeper your level of sedation will be. Depending on how you respond to the procedure, the anesthesia specialist may change your level of sedation or the type of anesthesia to fit your needs. An anesthesia specialist will monitor you closely during the procedure. Let your health care provider know about:  Any allergies you have.  All medicines you are taking, including vitamins, herbs, eye drops, creams, and over-the-counter medicines.  Any use of steroids (by mouth or as a cream).  Any problems you or family members have had with sedatives and anesthetic medicines.  Any blood disorders you have.  Any surgeries you have had.  Any medical conditions you have, such as sleep apnea.  Whether you are pregnant or may be pregnant.  Any use of cigarettes, alcohol, or street drugs. What are the risks? Generally, this is a safe procedure. However, problems may occur, including:  Getting too much medicine (oversedation).  Nausea.  Allergic reaction to medicines.  Trouble breathing. If this happens, a breathing tube may be used to help with breathing. It will be removed when you are awake and breathing on your own.  Heart trouble.  Lung trouble.  Before the procedure Staying hydrated Follow instructions from your health care provider about hydration, which may include:  Up to 2 hours before the procedure - you may continue to drink clear liquids, such as water, clear fruit juice, black coffee, and plain tea.  Eating and drinking restrictions Follow instructions from your health care provider about eating and drinking, which may include:  8 hours before  the procedure - stop eating heavy meals or foods such as meat, fried foods, or fatty foods.  6 hours before the procedure - stop eating light meals or foods, such as toast or cereal.  6 hours before the procedure - stop drinking milk or drinks that contain milk.  2 hours before the procedure - stop drinking clear liquids.  Medicines Ask your health care provider about:  Changing or stopping your regular medicines. This is especially important if you are taking diabetes medicines or blood thinners.  Taking medicines such as aspirin and ibuprofen. These medicines can thin your blood. Do not take these medicines before your procedure if your health care provider instructs you not to.  Tests and exams  You will have a physical exam.  You may have blood tests done to show: ? How well your kidneys and liver are working. ? How well  your blood can clot.  General instructions  Plan to have someone take you home from the hospital or clinic.  If you will be going home right after the procedure, plan to have someone with you for 24 hours.  What happens during the procedure?  Your blood pressure, heart rate, breathing, level of pain and overall condition will be monitored.  An IV tube will be inserted into one of your veins.  Your anesthesia specialist will give you medicines as needed to keep you comfortable during the procedure. This may mean changing the level of sedation.  The procedure will be performed. After the procedure  Your blood pressure, heart rate, breathing rate, and blood oxygen level will be monitored until the medicines you were given have worn off.  Do not drive for 24 hours if you received a sedative.  You may: ? Feel sleepy, clumsy, or nauseous. ? Feel forgetful about what happened after the procedure. ? Have a sore throat if you had a breathing tube during the procedure. ? Vomit. This information is not intended to replace advice given to you by your health  care provider. Make sure you discuss any questions you have with your health care provider. Document Released: 06/06/2005 Document Revised: 02/17/2016 Document Reviewed: 01/01/2016 Elsevier Interactive Patient Education  2018 [place], Care After These instructions provide you with information about caring for yourself after your procedure. Your health care provider may also give you more specific instructions. Your treatment has been planned according to current medical practices, but problems sometimes occur. Call your health care provider if you have any problems or questions after your procedure. What can I expect after the procedure? After your procedure, it is common to:  Feel sleepy for several hours.  Feel clumsy and have poor balance for several hours.  Feel forgetful about what happened after the procedure.  Have poor judgment for several hours.  Feel nauseous or vomit.  Have a sore throat if you had a breathing tube during the procedure.  Follow these instructions at home: For at least 24 hours after the procedure:   Do not: ? Participate in activities in which you could fall or become injured. ? Drive. ? Use heavy machinery. ? Drink alcohol. ? Take sleeping pills or medicines that cause drowsiness. ? Make important decisions or sign legal documents. ? Take care of children on your own.  Rest. Eating and drinking  Follow the diet that is recommended by your health care provider.  If you vomit, drink water, juice, or soup when you can drink without vomiting.  Make sure you have little or no nausea before eating solid foods. General instructions  Have a responsible adult stay with you until you are awake and alert.  Take over-the-counter and prescription medicines only as told by your health care provider.  If you smoke, do not smoke without supervision.  Keep all follow-up visits as told by your health care provider. This is  important. Contact a health care provider if:  You keep feeling nauseous or you keep vomiting.  You feel light-headed.  You develop a rash.  You have a fever. Get help right away if:  You have trouble breathing. This information is not intended to replace advice given to you by your health care provider. Make sure you discuss any questions you have with your health care provider. Document Released: 01/01/2016 Document Revised: 05/02/2016 Document Reviewed: 01/01/2016 Elsevier Interactive Patient Education  [employer].

## 2017-08-19 ENCOUNTER — Other Ambulatory Visit (HOSPITAL_COMMUNITY): Payer: Self-pay

## 2017-08-19 ENCOUNTER — Encounter (HOSPITAL_COMMUNITY)
Admission: RE | Admit: 2017-08-19 | Discharge: 2017-08-19 | Disposition: A | Discharge status: Home / Self Care | Payer: Medicare Other | Source: Ambulatory Visit | Attending: Internal Medicine | Admitting: Internal Medicine

## 2017-08-19 ENCOUNTER — Other Ambulatory Visit: Payer: Self-pay

## 2017-08-19 ENCOUNTER — Encounter (HOSPITAL_COMMUNITY): Payer: Self-pay

## 2017-08-19 DIAGNOSIS — Z01818 Encounter for other preprocedural examination: Secondary | ICD-10-CM | POA: Diagnosis not present

## 2017-08-19 DIAGNOSIS — R110 Nausea: Secondary | ICD-10-CM

## 2017-08-19 DIAGNOSIS — R109 Unspecified abdominal pain: Secondary | ICD-10-CM

## 2017-08-19 HISTORY — DX: Hypothyroidism, unspecified: E03.9

## 2017-08-19 HISTORY — DX: Personal history of urinary calculi: Z87.442

## 2017-08-19 HISTORY — DX: Unspecified cirrhosis of liver: K74.60

## 2017-08-19 LAB — CBC
HCT: 41 % (ref 36.0–46.0)
Hemoglobin: 13.4 g/dL (ref 12.0–15.0)
MCH: 29.6 pg (ref 26.0–34.0)
MCHC: 32.7 g/dL (ref 30.0–36.0)
MCV: 90.7 fL (ref 78.0–100.0)
Platelets: 111 10*3/uL — ABNORMAL LOW (ref 150–400)
RBC: 4.52 MIL/uL (ref 3.87–5.11)
RDW: 13.7 % (ref 11.5–15.5)
WBC: 5.5 10*3/uL (ref 4.0–10.5)

## 2017-08-19 LAB — BASIC METABOLIC PANEL
Anion gap: 9 (ref 5–15)
BUN: 13 mg/dL (ref 6–20)
CO2: 28 mmol/L (ref 22–32)
Calcium: 9.5 mg/dL (ref 8.9–10.3)
Chloride: 104 mmol/L (ref 101–111)
Creatinine, Ser: 0.56 mg/dL (ref 0.44–1.00)
GFR calc Af Amer: >60 mL/min (ref >60)
GFR calc non Af Amer: >60 mL/min (ref >60)
Glucose, Bld: 95 mg/dL (ref 65–99)
Potassium: 3.6 mmol/L (ref 3.5–5.1)
Sodium: 141 mmol/L (ref 135–145)

## 2017-08-23 ENCOUNTER — Ambulatory Visit (HOSPITAL_COMMUNITY)
Admission: RE | Admit: 2017-08-23 | Discharge: 2017-08-23 | Disposition: A | Discharge status: Home / Self Care | Payer: Medicare Other | Source: Ambulatory Visit | Attending: Internal Medicine | Admitting: Internal Medicine

## 2017-08-23 ENCOUNTER — Ambulatory Visit (HOSPITAL_COMMUNITY): Payer: Medicare Other | Admitting: Anesthesiology

## 2017-08-23 ENCOUNTER — Encounter (HOSPITAL_COMMUNITY)
Admission: RE | Disposition: A | Discharge status: Home / Self Care | Payer: Self-pay | Source: Ambulatory Visit | Attending: Internal Medicine

## 2017-08-23 DIAGNOSIS — K3189 Other diseases of stomach and duodenum: Secondary | ICD-10-CM | POA: Insufficient documentation

## 2017-08-23 DIAGNOSIS — Z880 Allergy status to penicillin: Secondary | ICD-10-CM | POA: Insufficient documentation

## 2017-08-23 DIAGNOSIS — F1120 Opioid dependence, uncomplicated: Secondary | ICD-10-CM | POA: Insufficient documentation

## 2017-08-23 DIAGNOSIS — K31819 Angiodysplasia of stomach and duodenum without bleeding: Secondary | ICD-10-CM | POA: Diagnosis not present

## 2017-08-23 DIAGNOSIS — R109 Unspecified abdominal pain: Secondary | ICD-10-CM

## 2017-08-23 DIAGNOSIS — M797 Fibromyalgia: Secondary | ICD-10-CM | POA: Diagnosis not present

## 2017-08-23 DIAGNOSIS — E039 Hypothyroidism, unspecified: Secondary | ICD-10-CM | POA: Diagnosis not present

## 2017-08-23 DIAGNOSIS — K7460 Unspecified cirrhosis of liver: Secondary | ICD-10-CM | POA: Diagnosis not present

## 2017-08-23 DIAGNOSIS — R110 Nausea: Secondary | ICD-10-CM

## 2017-08-23 DIAGNOSIS — M5136 Other intervertebral disc degeneration, lumbar region: Secondary | ICD-10-CM | POA: Insufficient documentation

## 2017-08-23 DIAGNOSIS — F329 Major depressive disorder, single episode, unspecified: Secondary | ICD-10-CM | POA: Diagnosis not present

## 2017-08-23 DIAGNOSIS — I10 Essential (primary) hypertension: Secondary | ICD-10-CM | POA: Insufficient documentation

## 2017-08-23 DIAGNOSIS — R1013 Epigastric pain: Secondary | ICD-10-CM | POA: Diagnosis not present

## 2017-08-23 DIAGNOSIS — K5909 Other constipation: Secondary | ICD-10-CM | POA: Insufficient documentation

## 2017-08-23 DIAGNOSIS — F17210 Nicotine dependence, cigarettes, uncomplicated: Secondary | ICD-10-CM | POA: Insufficient documentation

## 2017-08-23 DIAGNOSIS — B1920 Unspecified viral hepatitis C without hepatic coma: Secondary | ICD-10-CM | POA: Insufficient documentation

## 2017-08-23 DIAGNOSIS — Z87442 Personal history of urinary calculi: Secondary | ICD-10-CM | POA: Insufficient documentation

## 2017-08-23 DIAGNOSIS — K766 Portal hypertension: Secondary | ICD-10-CM | POA: Insufficient documentation

## 2017-08-23 DIAGNOSIS — Z79899 Other long term (current) drug therapy: Secondary | ICD-10-CM | POA: Diagnosis not present

## 2017-08-23 DIAGNOSIS — K259 Gastric ulcer, unspecified as acute or chronic, without hemorrhage or perforation: Secondary | ICD-10-CM | POA: Diagnosis not present

## 2017-08-23 HISTORY — PX: ESOPHAGOGASTRODUODENOSCOPY (EGD) WITH PROPOFOL: SHX5813

## 2017-08-23 SURGERY — ESOPHAGOGASTRODUODENOSCOPY (EGD) WITH PROPOFOL
Anesthesia: Monitor Anesthesia Care

## 2017-08-23 MED ORDER — PROPOFOL 10 MG/ML IV BOLUS
INTRAVENOUS | Status: AC
  Filled 2017-08-23: qty 20

## 2017-08-23 MED ORDER — FENTANYL CITRATE (PF) 100 MCG/2ML IJ SOLN
25.0000 ug | Freq: Once | INTRAMUSCULAR | Status: AC
  Administered 2017-08-23: 25 ug via INTRAVENOUS

## 2017-08-23 MED ORDER — CHLORHEXIDINE GLUCONATE CLOTH 2 % EX PADS: 6.0000 | MEDICATED_PAD | Freq: Once | CUTANEOUS | Status: DC

## 2017-08-23 MED ORDER — MIDAZOLAM HCL 2 MG/2ML IJ SOLN
INTRAMUSCULAR | Status: AC
  Filled 2017-08-23: qty 2

## 2017-08-23 MED ORDER — MIDAZOLAM HCL 5 MG/5ML IJ SOLN
INTRAMUSCULAR | Status: DC | PRN
  Administered 2017-08-23: 2 mg via INTRAVENOUS

## 2017-08-23 MED ORDER — MIDAZOLAM HCL 2 MG/2ML IJ SOLN
1.0000 mg | INTRAMUSCULAR | Status: AC
  Administered 2017-08-23: 2 mg via INTRAVENOUS

## 2017-08-23 MED ORDER — LACTATED RINGERS IV SOLN
INTRAVENOUS | Status: DC
  Administered 2017-08-23: 07:00:00 via INTRAVENOUS

## 2017-08-23 MED ORDER — PROPOFOL 500 MG/50ML IV EMUL
INTRAVENOUS | Status: DC | PRN
  Administered 2017-08-23: 125 ug/kg/min via INTRAVENOUS

## 2017-08-23 MED ORDER — PROPOFOL 10 MG/ML IV BOLUS
INTRAVENOUS | Status: AC
  Filled 2017-08-23: qty 40

## 2017-08-23 MED ORDER — FENTANYL CITRATE (PF) 100 MCG/2ML IJ SOLN
INTRAMUSCULAR | Status: AC
  Filled 2017-08-23: qty 2

## 2017-08-23 MED ORDER — LIDOCAINE VISCOUS 2 % MT SOLN
OROMUCOSAL | Status: AC
  Filled 2017-08-23: qty 15

## 2017-08-23 MED ORDER — LIDOCAINE VISCOUS 2 % MT SOLN
15.0000 mL | Freq: Once | OROMUCOSAL | Status: AC
  Administered 2017-08-23: 3 mL via OROMUCOSAL

## 2017-08-23 NOTE — H&P (Signed)
Teresa Benitez is an 61 y.o. female.   Chief Complaint: Patient is here for EGD. HPI: Patient is 61 year old Caucasian female with multiple medical problems who presents with chronic nausea and epigastric pain.  Last year she was diagnosed with gastric ulcer when she had EGD at [facility].  She did not return for follow-up visit.  She continued to use OTC NSAIDs but has decreased use.  She denies hematemesis melena or rectal bleeding.  Patient also has cirrhosis and mild thrombocytopenia but she does not have other stigmata of portal hypertension.  Past Medical History:  Diagnosis Date  . Chest pain    negative myoview 02/14/2015  . Chronic constipation   . Cirrhosis ([place])   . DDD (degenerative disc disease), lumbar   . Depression   . Fibromyalgia   . Hepatitis C    treated, none since 2016  . History of kidney stones   . Hypertension   . Hypothyroidism   . Liver disease   . Spleen enlarged   . Thrombocytopenia ([place])     Past Surgical History:  Procedure Laterality Date  . CARDIAC CATHETERIZATION     with stent  . CESAREAN SECTION  1996  . COLONOSCOPY  5-6 years ago    Done In [place]  . Para Thyhoid  2005  . PARTIAL HYSTERECTOMY     Patient states that she had 16 years ago?1998  . UPPER GASTROINTESTINAL ENDOSCOPY      Family History  Problem Relation Age of Onset  . Healthy Mother   . Diabetes Father   . Heart disease Father   . Hypertension Father   . Healthy Brother   . Healthy Brother   . Healthy Daughter   . Healthy Son    Social History:  reports that she has been smoking cigarettes.  She has a 44.00 pack-year smoking history. she has never used smokeless tobacco. She reports that she does not drink alcohol or use drugs.  Allergies:  Allergies  Allergen Reactions  . Ampicillin Hives, Shortness Of Breath and Swelling    Lips swelling Has patient had a PCN reaction causing immediate rash, facial/tongue/throat swelling, SOB or  lightheadedness with hypotension: Yes Has patient had a PCN reaction causing severe rash involving mucus membranes or skin necrosis: No Has patient had a PCN reaction that required hospitalization: No Has patient had a PCN reaction occurring within the last 10 years: Yes If all of the above answers are "NO", then may proceed with Cephalosporin use.   . Ciprofloxacin Hives  . Sulfamethoxazole Hives    Medications Prior to Admission  Medication Sig Dispense Refill  . alum & mag hydroxide-simeth (MAALOX/MYLANTA) 200-200-20 MG/5ML suspension Take 15 mLs by mouth every 6 (six) hours as needed for indigestion or heartburn.    . budesonide-formoterol (SYMBICORT) 160-4.5 MCG/ACT inhaler Inhale 1 puff into the lungs daily as needed (FOR SHORTNESS OF BREATH).     Marland Kitchen dicyclomine (BENTYL) 10 MG capsule Take 1 capsule (10 mg total) by mouth 3 (three) times daily as needed for spasms. 90 capsule 1  . fentaNYL (DURAGESIC - DOSED MCG/HR) 50 MCG/HR Place 50 mcg onto the skin every 3 (three) days.    Marland Kitchen gabapentin (NEURONTIN) 300 MG capsule Take 1,600 mg by mouth 3 (three) times daily.     Marland Kitchen levothyroxine (SYNTHROID, LEVOTHROID) 75 MCG tablet Take 75 mcg by mouth daily before breakfast.    . linaclotide (LINZESS) 145 MCG CAPS capsule Take 1 capsule (145 mcg total) by  mouth daily before breakfast. 30 capsule 5  . linaclotide (LINZESS) 72 MCG capsule Take 1 capsule (72 mcg total) by mouth daily before breakfast. 30 capsule 0  . lisinopril-hydrochlorothiazide (PRINZIDE,ZESTORETIC) 20-25 MG per tablet Take 1 tablet by mouth daily.    . methocarbamol (ROBAXIN) 500 MG tablet Take 500-750 mg by mouth 2 (two) times daily as needed for muscle spasms (takes 1 tablet in the morning and 1.5 tablet at bedtime). Takes a dose every morning and another dose at bedtime only if needed for pain    . metoCLOPramide (REGLAN) 5 MG tablet Take 5 mg by mouth 4 (four) times daily.    . Multiple Vitamin (MULTI-VITAMINS) TABS Take 1 tablet  by mouth daily.    . naproxen sodium (ANAPROX) 220 MG tablet Take 440 mg by mouth daily as needed (pain).    . nitrofurantoin, macrocrystal-monohydrate, (MACROBID) 100 MG capsule Take 1 capsule (100 mg total) by mouth 2 (two) times daily. 10 capsule 0  . omeprazole (PRILOSEC) 40 MG capsule TAKE 1 CAPSULE BY MOUTH IN THE MORNING ON  EMPTY  STOMACH  FOR  ULCER,  WAIT  45  MINUTES  BEFORE  EATING  FOR  30  DAYS    . ondansetron (ZOFRAN) 4 MG tablet Take 1 tablet (4 mg total) by mouth every 8 (eight) hours as needed for nausea or vomiting. Patient takes in the morning. 30 tablet 2  . oxyCODONE (OXY IR/ROXICODONE) 5 MG immediate release tablet Take 5-15 mg by mouth every 8 (eight) hours as needed for moderate pain or severe pain (depends on pain level if takes 1-3 tablets).     . promethazine (PHENERGAN) 25 MG tablet Take 25 mg by mouth every 8 (eight) hours as needed for nausea or vomiting (only takes when zofran does not work).     . ranitidine (ZANTAC) 300 MG capsule Take 300 mg by mouth every evening.     . sertraline (ZOLOFT) 100 MG tablet Take 50 mg by mouth daily. Takes 0.5 tablet    . vitamin B-12 (CYANOCOBALAMIN) 500 MCG tablet Take 1,000 mcg by mouth daily.     . bisacodyl (DULCOLAX) 10 MG suppository Place 1 suppository (10 mg total) rectally as needed for moderate constipation. 12 suppository 0  . sodium phosphate (FLEET) 7-19 GM/118ML ENEM Place 133 mLs (1 enema total) rectally daily as needed for severe constipation (if suppository does not work). 6 Bottle 0    No results found for this or any previous visit (from the past 48 hour(s)). No results found.  ROS  Blood pressure 139/68, pulse 64, temperature 97.9 F (36.6 C), temperature source Oral, resp. rate 15, SpO2 92 %. Physical Exam  Constitutional: She appears well-developed and well-nourished.  HENT:  Mouth/Throat: Oropharynx is clear and moist.  Eyes: Conjunctivae are normal. No scleral icterus.  Neck: No thyromegaly present.   Cardiovascular: Normal rate, regular rhythm and normal heart sounds.  No murmur heard. Respiratory: Effort normal and breath sounds normal.  GI:  Abdomen is full.  On palpation it soft.  She has mild tenderness in mid epigastric region as well as left upper and left lower quadrant without guarding or rebound.  No organomegaly or masses.  Musculoskeletal: She exhibits no edema.  Lymphadenopathy:    She has no cervical adenopathy.  Neurological: She is alert.  Skin: Skin is warm and dry.     Assessment/Plan Chronic nausea and epigastric pain unresponsive therapy. History of gastric ulcer. Diagnostic EGD primarily to document healing of  gastric ulcer or otherwise. She has cirrhosis and therefore would also screen for esophageal varices.  Hildred Laser, MD 08/23/2017, 7:31 AM

## 2017-08-23 NOTE — Op Note (Addendum)
[facility] Patient Name: Teresa Benitez Procedure Date: 08/23/2017 7:28 AM MRN: [id number] Date of Birth: 24-Oct-1955 Attending MD: Hildred Laser , MD CSN: [id number] Age: 61 Admit Type: Outpatient Procedure:                Upper GI endoscopy Indications:              Epigastric abdominal pain, Follow-up of peptic                            ulcer, Follow-up of gastric ulcer, Nausea Providers:                Hildred Laser, MD, Otis Peak B. Sharon Seller, RN, Nelma Rothman, Technician, Randa Spike, Technician Referring MD:             Zella Richer. Scotty Court, MD Medicines:                Lidocaine spray, Propofol per Anesthesia Complications:            No immediate complications. Estimated Blood Loss:     Estimated blood loss: none. Procedure:                Pre-Anesthesia Assessment:                           - Prior to the procedure, a History and Physical                            was performed, and patient medications and                            allergies were reviewed. The patient's tolerance of                            previous anesthesia was also reviewed. The risks                            and benefits of the procedure and the sedation                            options and risks were discussed with the patient.                            All questions were answered, and informed consent                            was obtained. Prior Anticoagulants: The patient                            last took naproxen 1 day prior to the procedure.                            ASA Grade Assessment: III - A patient with severe  systemic disease. After reviewing the risks and                            benefits, the patient was deemed in satisfactory                            condition to undergo the procedure.                           After obtaining informed consent, the endoscope was                            passed under direct vision.  Throughout the                            procedure, the patient's blood pressure, pulse, and                            oxygen saturations were monitored continuously. The                            EG-[id number] ([id number]) scope was introduced through the                            mouth, and advanced to the second part of duodenum.                            The upper GI endoscopy was accomplished without                            difficulty. The patient tolerated the procedure                            well. Scope In: 7:45:49 AM Scope Out: 7:51:57 AM Scope Withdrawal Time: 0 hours 0 minutes 14 seconds  Total Procedure Duration: 0 hours 6 minutes 8 seconds  Findings:      The examined esophagus was normal.      The Z-line was regular and was found 42 cm from the incisors.      Mild portal hypertensive gastropathy was found in the gastric fundus and       in the gastric body.      Mild gastric antral vascular ectasia without bleeding was present in the       gastric antrum and in the prepyloric region of the stomach.      A single, small non-bleeding erosion was found at the pylorus.      The duodenal bulb and second portion of the duodenum were normal. Impression:               - Normal esophagus.                           - Z-line regular, 42 cm from the incisors.                           - Portal hypertensive gastropathy.                           -  Gastric antral vascular ectasia without bleeding.                           - Non-bleeding erosive gastropathy.                           - Normal duodenal bulb and second portion of the                            duodenum.                           - No specimens collected. Moderate Sedation:      Per Anesthesia Care Recommendation:           - Patient has a contact number available for                            emergencies. The signs and symptoms of potential                            delayed complications were discussed with the                             patient. Return to normal activities tomorrow.                            Written discharge instructions were provided to the                            patient.                           - Resume previous diet today.                           - Continue present medications.                           - Keep NSAID use to minimum.                           - Office visit in 6 months. Procedure Code(s):        --- Professional ---                           [phone], Esophagogastroduodenoscopy, flexible,                            transoral; diagnostic, including collection of                            specimen(s) by brushing or washing, when performed                            (separate procedure) Diagnosis Code(s):        --- Professional ---  K76.6, Portal hypertension                           K31.89, Other diseases of stomach and duodenum                           K31.819, Angiodysplasia of stomach and duodenum                            without bleeding                           R10.13, Epigastric pain                           K27.9, Peptic ulcer, site unspecified, unspecified                            as acute or chronic, without hemorrhage or                            perforation                           K25.9, Gastric ulcer, unspecified as acute or                            chronic, without hemorrhage or perforation                           R11.0, Nausea CPT copyright 2016 American Medical Association. All rights reserved. The codes documented in this report are preliminary and upon coder review may  be revised to meet current compliance requirements. Hildred Laser, MD Hildred Laser, MD 08/23/2017 8:02:42 AM This report has been signed electronically. Number of Addenda: 0

## 2017-08-23 NOTE — Anesthesia Postprocedure Evaluation (Signed)
Anesthesia Post Note  Patient: Jacquetta Polhamus  Procedure(s) Performed: ESOPHAGOGASTRODUODENOSCOPY (EGD) WITH PROPOFOL (N/A )  Patient location during evaluation: PACU Anesthesia Type: MAC Level of consciousness: patient cooperative Pain management: pain level controlled Vital Signs Assessment: post-procedure vital signs reviewed and stable Respiratory status: spontaneous breathing, nonlabored ventilation and respiratory function stable Cardiovascular status: blood pressure returned to baseline Postop Assessment: no apparent nausea or vomiting Anesthetic complications: no     Last Vitals:  Vitals:   08/23/17 0730 08/23/17 0806  BP: 133/67 (!) 104/57  Pulse:  67  Resp: 18 11  Temp:  (!) 36.4 C  SpO2: 92% 95%    Last Pain:  Vitals:   08/23/17 0740  TempSrc:   PainSc: 2                  Helmer Dull J

## 2017-08-23 NOTE — Discharge Instructions (Signed)
Moderate Conscious Sedation, Adult, Care After These instructions provide you with information about caring for yourself after your procedure. Your health care provider may also give you more specific instructions. Your treatment has been planned according to current medical practices, but problems sometimes occur. Call your health care provider if you have any problems or questions after your procedure. What can I expect after the procedure? After your procedure, it is common:  To feel sleepy for several hours.  To feel clumsy and have poor balance for several hours.  To have poor judgment for several hours.  To vomit if you eat too soon.  Follow these instructions at home: For at least 24 hours after the procedure:   Do not: ? Participate in activities where you could fall or become injured. ? Drive. ? Use heavy machinery. ? Drink alcohol. ? Take sleeping pills or medicines that cause drowsiness. ? Make important decisions or sign legal documents. ? Take care of children on your own.  Rest. Eating and drinking  Follow the diet recommended by your health care provider.  If you vomit: ? Drink water, juice, or soup when you can drink without vomiting. ? Make sure you have little or no nausea before eating solid foods. General instructions  Have a responsible adult stay with you until you are awake and alert.  Take over-the-counter and prescription medicines only as told by your health care provider.  If you smoke, do not smoke without supervision.  Keep all follow-up visits as told by your health care provider. This is important. Contact a health care provider if:  You keep feeling nauseous or you keep vomiting.  You feel light-headed.  You develop a rash.  You have a fever. Get help right away if:  You have trouble breathing. This information is not intended to replace advice given to you by your health care provider. Make sure you discuss any questions you have  with your health care provider. Document Released: 07/01/2013 Document Revised: 02/13/2016 Document Reviewed: 12/31/2015 Elsevier Interactive Patient Education  2018 [place] usual medications and diet as before. Keep naproxen use to minimum. No driving for 24 hours.

## 2017-08-23 NOTE — Anesthesia Preprocedure Evaluation (Signed)
Anesthesia Evaluation  Patient identified by MRN, date of birth, ID band Patient awake    Reviewed: Allergy & Precautions, NPO status , Patient's Chart, lab work & pertinent test results  Airway Mallampati: II  TM Distance: >3 FB Neck ROM: Full    Dental  (+) Chipped, Teeth Intact,    Pulmonary Current Smoker,    breath sounds clear to auscultation       Cardiovascular hypertension, Pt. on medications + CAD   Rhythm:Regular Rate:Normal     Neuro/Psych PSYCHIATRIC DISORDERS Depression  Neuromuscular disease    GI/Hepatic GERD  ,(+) Cirrhosis       , Hepatitis -, C  Endo/Other  Hypothyroidism   Renal/GU      Musculoskeletal  (+) Arthritis , Fibromyalgia -, narcotic dependent  Abdominal   Peds  Hematology   Anesthesia Other Findings   Reproductive/Obstetrics                             Anesthesia Physical Anesthesia Plan  ASA: III  Anesthesia Plan: MAC   Post-op Pain Management:    Induction: Intravenous  PONV Risk Score and Plan:   Airway Management Planned: Simple Face Mask  Additional Equipment:   Intra-op Plan:   Post-operative Plan:   Informed Consent: I have reviewed the patients History and Physical, chart, labs and discussed the procedure including the risks, benefits and alternatives for the proposed anesthesia with the patient or authorized representative who has indicated his/her understanding and acceptance.     Plan Discussed with:   Anesthesia Plan Comments:         Anesthesia Quick Evaluation

## 2017-08-23 NOTE — Transfer of Care (Signed)
Immediate Anesthesia Transfer of Care Note  Patient: Teresa Benitez  Procedure(s) Performed: ESOPHAGOGASTRODUODENOSCOPY (EGD) WITH PROPOFOL (N/A )  Patient Location: PACU  Anesthesia Type:MAC  Level of Consciousness: awake and patient cooperative  Airway & Oxygen Therapy: Patient Spontanous Breathing and Patient connected to face mask oxygen  Post-op Assessment: Report given to RN, Post -op Vital signs reviewed and stable and Patient moving all extremities  Post vital signs: Reviewed and stable  Last Vitals:  Vitals:   08/23/17 0725 08/23/17 0730  BP: 139/68 133/67  Pulse:    Resp: 15 18  Temp:    SpO2: 92% 92%    Last Pain:  Vitals:   08/23/17 0740  TempSrc:   PainSc: 2          Complications: No apparent anesthesia complications

## 2017-08-26 ENCOUNTER — Encounter (INDEPENDENT_AMBULATORY_CARE_PROVIDER_SITE_OTHER): Payer: Self-pay | Admitting: Internal Medicine

## 2017-08-27 ENCOUNTER — Encounter (HOSPITAL_COMMUNITY): Payer: Self-pay | Admitting: Internal Medicine

## 2017-09-04 ENCOUNTER — Telehealth (INDEPENDENT_AMBULATORY_CARE_PROVIDER_SITE_OTHER): Payer: Self-pay | Admitting: *Deleted

## 2017-09-04 NOTE — Telephone Encounter (Signed)
Per Dr.Rehman arrange Gastric Emptying Study. Patient called to make aware that this is what his recommendation is , and that you will call her with the other information.

## 2017-09-05 ENCOUNTER — Other Ambulatory Visit (INDEPENDENT_AMBULATORY_CARE_PROVIDER_SITE_OTHER): Payer: Self-pay | Admitting: *Deleted

## 2017-09-05 DIAGNOSIS — R112 Nausea with vomiting, unspecified: Secondary | ICD-10-CM

## 2017-09-05 DIAGNOSIS — R1013 Epigastric pain: Secondary | ICD-10-CM

## 2017-09-05 NOTE — Telephone Encounter (Signed)
GES sch'd 09/09/17 at 800 (745 am), npo after midnight, no stomach med after midnight, left detailed message for patient

## 2017-09-09 ENCOUNTER — Encounter (HOSPITAL_COMMUNITY)
Admission: RE | Admit: 2017-09-09 | Discharge: 2017-09-09 | Disposition: A | Discharge status: Home / Self Care | Payer: Medicare Other | Source: Ambulatory Visit | Attending: Internal Medicine | Admitting: Internal Medicine

## 2017-09-09 ENCOUNTER — Encounter (HOSPITAL_COMMUNITY): Payer: Self-pay

## 2017-09-09 DIAGNOSIS — R1013 Epigastric pain: Secondary | ICD-10-CM | POA: Insufficient documentation

## 2017-09-09 DIAGNOSIS — R112 Nausea with vomiting, unspecified: Secondary | ICD-10-CM | POA: Diagnosis present

## 2017-09-09 MED ORDER — TECHNETIUM TC 99M SULFUR COLLOID
2.0000 | Freq: Once | INTRAVENOUS | Status: AC | PRN
  Administered 2017-09-09: 2 via INTRAVENOUS

## 2017-10-24 ENCOUNTER — Other Ambulatory Visit (INDEPENDENT_AMBULATORY_CARE_PROVIDER_SITE_OTHER): Payer: Self-pay | Admitting: Internal Medicine

## 2017-10-24 DIAGNOSIS — R109 Unspecified abdominal pain: Secondary | ICD-10-CM

## 2017-11-01 ENCOUNTER — Telehealth (INDEPENDENT_AMBULATORY_CARE_PROVIDER_SITE_OTHER): Payer: Self-pay | Admitting: *Deleted

## 2017-11-01 NOTE — Telephone Encounter (Signed)
Patient has left 2 messages . She states that the nausea is much better. At her last visit she states that the focus was on the nausea. Bradleigh says that the pain in her left side is persistent , and that she thinks something was said about abdominal wall was mentioned.  She would like to know any recommendations or when she can be seen by Dr.Rehman for a further evaluation.   Her phone number is [phone].

## 2017-11-03 NOTE — Telephone Encounter (Signed)
Call returned. Patient is already on gabapentin and Duragesic patch. She has been to pain clinic physician before.  He will call their office in [place] to set up an appointment. If she needs a referral we will send one since Dr. Scotty Court has retired.

## 2017-11-13 ENCOUNTER — Telehealth (INDEPENDENT_AMBULATORY_CARE_PROVIDER_SITE_OTHER): Payer: Self-pay | Admitting: *Deleted

## 2017-11-13 NOTE — Telephone Encounter (Signed)
LM on Teresa Benitez's VM requesting return call to schedule OV

## 2017-11-13 NOTE — Telephone Encounter (Signed)
Patient's PCP is retired. Per Dr.Rehman we may do the referral for pain management to Dr.[place].   Patient will be called and made aware that this is being done and that either his office or our office will give her a call with the date and time,   Forwarded to Lelon Frohlich to make the referral.

## 2017-11-13 NOTE — Telephone Encounter (Signed)
Referral & notes faxed to Dr Merlene Laughter, they will contact patient

## 2017-12-05 ENCOUNTER — Telehealth (INDEPENDENT_AMBULATORY_CARE_PROVIDER_SITE_OTHER): Payer: Self-pay | Admitting: *Deleted

## 2017-12-05 NOTE — Telephone Encounter (Signed)
Dr Merlene Laughter denied patient for pain management -- do you want me to try [facility] in [place]

## 2017-12-08 NOTE — Telephone Encounter (Signed)
Yes we can 

## 2017-12-09 NOTE — Telephone Encounter (Signed)
Referral and notes faxed to [facility], they will contact patient with appt

## 2017-12-27 ENCOUNTER — Other Ambulatory Visit (INDEPENDENT_AMBULATORY_CARE_PROVIDER_SITE_OTHER): Payer: Self-pay | Admitting: Internal Medicine

## 2018-03-04 ENCOUNTER — Ambulatory Visit (INDEPENDENT_AMBULATORY_CARE_PROVIDER_SITE_OTHER): Payer: Medicare Other | Admitting: Internal Medicine

## 2018-04-30 ENCOUNTER — Other Ambulatory Visit (INDEPENDENT_AMBULATORY_CARE_PROVIDER_SITE_OTHER): Payer: Self-pay | Admitting: Internal Medicine

## 2018-04-30 DIAGNOSIS — R109 Unspecified abdominal pain: Secondary | ICD-10-CM

## 2018-06-03 ENCOUNTER — Ambulatory Visit (INDEPENDENT_AMBULATORY_CARE_PROVIDER_SITE_OTHER): Payer: Medicare Other | Admitting: Internal Medicine

## 2018-06-03 ENCOUNTER — Encounter (INDEPENDENT_AMBULATORY_CARE_PROVIDER_SITE_OTHER): Payer: Self-pay | Admitting: *Deleted

## 2018-06-03 ENCOUNTER — Encounter (INDEPENDENT_AMBULATORY_CARE_PROVIDER_SITE_OTHER): Payer: Self-pay | Admitting: Internal Medicine

## 2018-06-03 VITALS — BP 140/82 | HR 70 | Temp 99.4°F | Resp 18 | Ht 71.5 in | Wt 218.5 lb

## 2018-06-03 DIAGNOSIS — R109 Unspecified abdominal pain: Secondary | ICD-10-CM | POA: Diagnosis not present

## 2018-06-03 DIAGNOSIS — R5383 Other fatigue: Secondary | ICD-10-CM

## 2018-06-03 DIAGNOSIS — R110 Nausea: Secondary | ICD-10-CM | POA: Diagnosis not present

## 2018-06-03 DIAGNOSIS — K7460 Unspecified cirrhosis of liver: Secondary | ICD-10-CM | POA: Diagnosis not present

## 2018-06-03 DIAGNOSIS — K5900 Constipation, unspecified: Secondary | ICD-10-CM

## 2018-06-03 DIAGNOSIS — R5381 Other malaise: Secondary | ICD-10-CM

## 2018-06-03 NOTE — Progress Notes (Signed)
Presenting complaint;  Follow-up for nausea abdominal pain constipation and cirrhosis.  Database and subjective:  Patient is 62 year old Caucasian female who is here for scheduled visit.  She was last seen on 07/11/2017.  Following that visit she had EGD November 2018 and and was noted to have healed gastric ulcer. She continues to complain of nausea which occurs 2-3 times a week.  She says she has never been able to vomit.  She says she is not having heartburn while she is on H2 B.  She feels she is doing much better as far as her constipation is concerned.  She has bowel movement on most days.  She takes Linzess no more than 4-5 times in a month.  She is not trying to walk more.  She walks at least 3 times a week.  She takes oxycodone 4 times a day for back pain. Patient states she has not felt well for the last 4 days she has no energy she has poor appetite and she is a low-grade fever.  She denies cough dysuria or vaginal discharge.  She has not had any night sweats.   Current Medications: Outpatient Encounter Medications as of 06/03/2018  Medication Sig  . alum & mag hydroxide-simeth (MAALOX/MYLANTA) 200-200-20 MG/5ML suspension Take 15 mLs by mouth every 6 (six) hours as needed for indigestion or heartburn.  . BELBUCA 150 MCG FILM PLACE 1 FILM ON TONGUE TWO TIMES DAILY  . budesonide-formoterol (SYMBICORT) 160-4.5 MCG/ACT inhaler Inhale 1 puff into the lungs daily as needed (FOR SHORTNESS OF BREATH).   . Cholecalciferol (VITAMIN D PO) Take 1,000 Units by mouth daily.  Marland Kitchen dicyclomine (BENTYL) 10 MG capsule TAKE 1 CAPSULE BY MOUTH THREE TIMES DAILY AS NEEDED FOR  SPASMS  . gabapentin (NEURONTIN) 300 MG capsule Take 1,200 mg by mouth 3 (three) times daily.   Marland Kitchen levothyroxine (SYNTHROID, LEVOTHROID) 75 MCG tablet Take 75 mcg by mouth daily before breakfast.  . linaclotide (LINZESS) 72 MCG capsule Take 1 capsule (72 mcg total) by mouth daily before breakfast.  . lisinopril-hydrochlorothiazide  (PRINZIDE,ZESTORETIC) 20-25 MG per tablet Take 1 tablet by mouth daily.  . methocarbamol (ROBAXIN) 500 MG tablet Take 500-750 mg by mouth 2 (two) times daily as needed for muscle spasms (takes 1 tablet in the morning and 1.5 tablet at bedtime). Takes a dose every morning and another dose at bedtime only if needed for pain  . metoCLOPramide (REGLAN) 5 MG tablet Take 5 mg by mouth 4 (four) times daily.  . Multiple Vitamin (MULTI-VITAMINS) TABS Take 1 tablet by mouth daily.  . naproxen sodium (ANAPROX) 220 MG tablet Take 440 mg by mouth daily as needed (pain).  . ondansetron (ZOFRAN) 4 MG tablet Take 1 tablet (4 mg total) by mouth every 8 (eight) hours as needed for nausea or vomiting. Patient takes in the morning.  Marland Kitchen oxyCODONE (OXY IR/ROXICODONE) 5 MG immediate release tablet Take 5-15 mg by mouth every 8 (eight) hours as needed for moderate pain or severe pain (depends on pain level if takes 1-3 tablets).   . promethazine (PHENERGAN) 25 MG tablet Take 25 mg by mouth every 8 (eight) hours as needed for nausea or vomiting (only takes when zofran does not work).   . ranitidine (ZANTAC) 300 MG capsule Take 300 mg by mouth every evening.   . sertraline (ZOLOFT) 100 MG tablet Take 50 mg by mouth daily. Takes 0.5 tablet  . vitamin B-12 (CYANOCOBALAMIN) 500 MCG tablet Take 1,000 mcg by mouth daily.   . [  DISCONTINUED] bisacodyl (DULCOLAX) 10 MG suppository Place 1 suppository (10 mg total) rectally as needed for moderate constipation.  . [DISCONTINUED] sodium phosphate (FLEET) 7-19 GM/118ML ENEM Place 133 mLs (1 enema total) rectally daily as needed for severe constipation (if suppository does not work).  . [DISCONTINUED] fentaNYL (DURAGESIC - DOSED MCG/HR) 50 MCG/HR Place 50 mcg onto the skin every 3 (three) days.  . [DISCONTINUED] LINZESS 145 MCG CAPS capsule TAKE 1 CAPSULE BY MOUTH ONCE DAILY BEFORE BREAKFAST (Patient not taking: Reported on 06/03/2018)  . [DISCONTINUED] nitrofurantoin,  macrocrystal-monohydrate, (MACROBID) 100 MG capsule Take 1 capsule (100 mg total) by mouth 2 (two) times daily. (Patient not taking: Reported on 06/03/2018)  . [DISCONTINUED] omeprazole (PRILOSEC) 40 MG capsule TAKE 1 CAPSULE BY MOUTH IN THE MORNING ON  EMPTY  STOMACH  FOR  ULCER,  WAIT  45  MINUTES  BEFORE  EATING  FOR  30  DAYS   No facility-administered encounter medications on file as of 06/03/2018.      Objective: Blood pressure 140/82, pulse 70, temperature 99.4 F (37.4 C), temperature source Oral, resp. rate 18, height 5' 11.5" (1.816 m), weight 218 lb 8 oz (99.1 kg). Patient is alert and in no acute distress. She does not have tremors or asterixis. Conjunctiva is pink. Sclera is nonicteric Oropharyngeal mucosa is normal. No neck masses or thyromegaly noted. Cardiac exam with regular rhythm normal S1 and S2. No murmur or gallop noted. Lungs are clear to auscultation. Abdomen is full.  Bowel sounds normal.  No bruit noted.  She has tenderness on superficial and deep palpation in left lower and left upper quadrant.  No organomegaly or masses. No LE edema or clubbing noted.    Assessment:  #1.  Cirrhosis.  She has compensated disease.  She has history of hepatitis C for which she was treated with success at [facility] 8 years ago.  Her other risk factor is fatty liver. She is due for [facility] screening.  #2.  Nausea without vomiting.  She has history of gastric ulcer but EGD in November last year revealed healed gastric ulcer.  Gastric emptying study was normal.  Her nausea is most likely due to medications.  She is using antibiotics and metoclopramide on as-needed basis.  #3.  Left-sided abdominal pain appears to be multifactorial.  She has both superficial and deep pain.  Some of the pain is most likely neuropathic pain.  She is getting some relief with dicyclomine that she is using on as-needed basis.  #4.  Chronic constipation.  She is doing better with dietary measures and uses Linzess  occasionally.  #5. malaise and fatigue.  She has had the symptoms for a few days.  She has low-grade fever.  She has no localizing symptoms.  Will check urine to make sure she does not have UTI.   Plan:  Medication list updated. Patient reminded to use naproxen only on as-needed basis. Patient will go to the lab for following studies; Urine analysis if abnormal to be followed by culture, AFP, CBC and comprehensive chemistry panel. Will schedule patient for upper abdominal ultrasound.

## 2018-06-03 NOTE — Patient Instructions (Signed)
Check temperature twice daily for next 3 days.  Call if greater than 101. Physician will call with results of blood tests and urinalysis when completed.

## 2018-06-06 LAB — CBC
HCT: 39.8 % (ref 35.0–45.0)
Hemoglobin: 13.7 g/dL (ref 11.7–15.5)
MCH: 29.5 pg (ref 27.0–33.0)
MCHC: 34.4 g/dL (ref 32.0–36.0)
MCV: 85.8 fL (ref 80.0–100.0)
MPV: 10.7 fL (ref 7.5–12.5)
Platelets: 110 10*3/uL — ABNORMAL LOW (ref 140–400)
RBC: 4.64 10*6/uL (ref 3.80–5.10)
RDW: 13.9 % (ref 11.0–15.0)
WBC: 5.4 10*3/uL (ref 3.8–10.8)

## 2018-06-06 LAB — COMPREHENSIVE METABOLIC PANEL
AG Ratio: 1.1 (calc) (ref 1.0–2.5)
ALT: 10 U/L (ref 6–29)
AST: 21 U/L (ref 10–35)
Albumin: 3.9 g/dL (ref 3.6–5.1)
Alkaline phosphatase (APISO): 92 U/L (ref 33–130)
BUN: 8 mg/dL (ref 7–25)
CO2: 31 mmol/L (ref 20–32)
Calcium: 9.6 mg/dL (ref 8.6–10.4)
Chloride: 98 mmol/L (ref 98–110)
Creat: 0.72 mg/dL (ref 0.50–0.99)
Globulin: 3.7 g/dL (calc) (ref 1.9–3.7)
Glucose, Bld: 105 mg/dL — ABNORMAL HIGH (ref 65–99)
Potassium: 4.4 mmol/L (ref 3.5–5.3)
Sodium: 136 mmol/L (ref 135–146)
Total Bilirubin: 0.6 mg/dL (ref 0.2–1.2)
Total Protein: 7.6 g/dL (ref 6.1–8.1)

## 2018-06-06 LAB — AFP TUMOR MARKER: AFP-Tumor Marker: 3.5 ng/mL

## 2018-06-09 ENCOUNTER — Ambulatory Visit (HOSPITAL_COMMUNITY): Payer: Medicare Other

## 2018-06-19 ENCOUNTER — Ambulatory Visit (HOSPITAL_COMMUNITY)
Admission: RE | Admit: 2018-06-19 | Discharge: 2018-06-19 | Disposition: A | Discharge status: Home / Self Care | Payer: Medicare Other | Source: Ambulatory Visit | Attending: Internal Medicine | Admitting: Internal Medicine

## 2018-06-19 DIAGNOSIS — R161 Splenomegaly, not elsewhere classified: Secondary | ICD-10-CM | POA: Insufficient documentation

## 2018-06-19 DIAGNOSIS — K7460 Unspecified cirrhosis of liver: Secondary | ICD-10-CM | POA: Diagnosis present

## 2018-07-14 ENCOUNTER — Telehealth (INDEPENDENT_AMBULATORY_CARE_PROVIDER_SITE_OTHER): Payer: Self-pay | Admitting: *Deleted

## 2018-07-14 NOTE — Telephone Encounter (Signed)
Patient left a message on Sunday, October 21. She states that the nausea is back with a vengeance. She has noticed it is associated with and gets worse with bowel movements. Then , the nausea stays all day and all night. She is taking that Phenergan and the Ondansetron as prescribed , they are note relieving the nausea.  Call back [phone].    Patient was called and a message was left letting patient to know that I have rec'd message and that Dr.Rehman will contacting the office and this will be addressed.

## 2018-07-15 NOTE — Telephone Encounter (Signed)
Patient returned call and she stated that she would like to start the Reglan. A new RX was called to [facility] . Patient is aware.

## 2018-07-15 NOTE — Telephone Encounter (Signed)
Per Dr.Rehman the patient may try the Reglan , take 1 by mouth twice a day. Or we can send her for a second opinion. Feels that some of the medications that the patient is on may be causing the nausea. Patient called and made aware.

## 2018-07-21 ENCOUNTER — Telehealth (INDEPENDENT_AMBULATORY_CARE_PROVIDER_SITE_OTHER): Payer: Self-pay | Admitting: *Deleted

## 2018-07-21 NOTE — Telephone Encounter (Signed)
Patient left a message on my voice mail that she had went Saturday to get her RX for Reglan , she was told that it was not called in .  I was transferred to the pharmacist voicemail as it would be a 20 minute wait to speak with her.  Generic Reglan 5 mg - patient is to take 1 by mouth twice a day. #60 5 refills, was left on the pharmacist voicemail. Patient was called and made aware.

## 2018-08-18 ENCOUNTER — Encounter: Payer: Self-pay | Admitting: Neurology

## 2018-08-18 ENCOUNTER — Other Ambulatory Visit: Payer: Self-pay

## 2018-08-18 ENCOUNTER — Ambulatory Visit (INDEPENDENT_AMBULATORY_CARE_PROVIDER_SITE_OTHER): Payer: Medicare Other | Admitting: Neurology

## 2018-08-18 VITALS — BP 184/93 | HR 82 | Resp 18 | Ht 71.5 in | Wt 220.0 lb

## 2018-08-18 DIAGNOSIS — R202 Paresthesia of skin: Secondary | ICD-10-CM

## 2018-08-18 NOTE — Progress Notes (Signed)
Reason for visit: Hand numbness  Referring physician: [name] is a 62 y.o. female  History of present illness:  Teresa Benitez is a 62 year old right-handed white female with a history of obesity and a prior history of hepatitis C.  The patient reports a several month history of spontaneous onset of numbness and paresthesias in the hands that will come on at nighttime.  The right hand has remained severe, the left hand has improved some.  The patient does have fibromyalgia, she does have some left neck and shoulder discomfort associated with this.  She has chronic back pain and discomfort down both legs.  She feels weak in both legs, she has not had any falls, she denies any problems controlling the bowels or the bladder.  More recently, she has had spontaneous onset of right knee swelling and pain, she is now requiring a cane for ambulation.  The patient denies any pain down the arms with turning her head from one side to the next.  She is sent to this office for an evaluation.  Past Medical History:  Diagnosis Date  . Chest pain    negative myoview 02/14/2015  . Chronic constipation   . Cirrhosis ([place])   . DDD (degenerative disc disease), lumbar   . Depression   . Fibromyalgia   . Hepatitis C    treated, none since 2016  . Hepatitis C approx. 2004  . History of kidney stones   . Hypertension   . Hypothyroidism   . Liver disease   . Spleen enlarged   . Thrombocytopenia ([place])   . Vision abnormalities     Past Surgical History:  Procedure Laterality Date  . CARDIAC CATHETERIZATION     with stent  . CESAREAN SECTION  1996  . COLONOSCOPY  5-6 years ago    Done In [place]  . ESOPHAGOGASTRODUODENOSCOPY (EGD) WITH PROPOFOL N/A 08/23/2017   Procedure: ESOPHAGOGASTRODUODENOSCOPY (EGD) WITH PROPOFOL;  Surgeon: Rogene Houston, MD;  Location: [facility];  Service: Endoscopy;  Laterality: N/A;  . Para Thyhoid  2005  . PARTIAL HYSTERECTOMY     Patient  states that she had 16 years ago?1998  . UPPER GASTROINTESTINAL ENDOSCOPY      Family History  Problem Relation Age of Onset  . Healthy Mother   . Diabetes Father   . Heart disease Father   . Hypertension Father   . Healthy Brother   . Healthy Brother   . Healthy Daughter   . Healthy Son     Social history:  reports that she has been smoking cigarettes. She has a 44.00 pack-year smoking history. She has never used smokeless tobacco. She reports that she does not drink alcohol or use drugs.  Medications:  Prior to Admission medications   Medication Sig Start Date End Date Taking? Authorizing Provider  alum & mag hydroxide-simeth (MAALOX/MYLANTA) 200-200-20 MG/5ML suspension Take 15 mLs by mouth every 6 (six) hours as needed for indigestion or heartburn.   Yes [provider]  BELBUCA 150 MCG FILM PLACE 1 FILM ON TONGUE TWO TIMES DAILY 05/20/18  Yes [provider]  budesonide-formoterol (SYMBICORT) 160-4.5 MCG/ACT inhaler Inhale 1 puff into the lungs daily as needed (FOR SHORTNESS OF BREATH).    Yes [provider]  Cholecalciferol (VITAMIN D PO) Take 1,000 Units by mouth daily.   Yes [provider]  dicyclomine (BENTYL) 10 MG capsule TAKE 1 CAPSULE BY MOUTH THREE TIMES DAILY AS NEEDED FOR  SPASMS  05/01/18  Yes Setzer, Terri L, NP  gabapentin (NEURONTIN) 300 MG capsule Take 1,200 mg by mouth 3 (three) times daily.    Yes [provider]  levothyroxine (SYNTHROID, LEVOTHROID) 75 MCG tablet Take 75 mcg by mouth daily before breakfast.   Yes [provider]  linaclotide (LINZESS) 72 MCG capsule Take 1 capsule (72 mcg total) by mouth daily before breakfast. 07/11/17  Yes Rehman, Mechele Dawley, MD  lisinopril (PRINIVIL,ZESTRIL) 30 MG tablet Take by mouth. 06/30/18 07/01/19 Yes [provider]  lisinopril-hydrochlorothiazide (PRINZIDE,ZESTORETIC) 20-25 MG per tablet Take 1 tablet by mouth daily.   Yes [provider]    methocarbamol (ROBAXIN) 500 MG tablet Take 500-750 mg by mouth 2 (two) times daily as needed for muscle spasms (takes 1 tablet in the morning and 1.5 tablet at bedtime). Takes a dose every morning and another dose at bedtime only if needed for pain   Yes [provider]  metoCLOPramide (REGLAN) 5 MG tablet Take 1 tablet (5 mg total) by mouth every 6 (six) hours as needed for nausea. 06/03/18  Yes Rehman, Mechele Dawley, MD  Multiple Vitamin (MULTI-VITAMINS) TABS Take 1 tablet by mouth daily.   Yes [provider]  naproxen sodium (ANAPROX) 220 MG tablet Take 440 mg by mouth daily as needed (pain).   Yes [provider]  ondansetron (ZOFRAN) 4 MG tablet Take 1 tablet (4 mg total) by mouth every 8 (eight) hours as needed for nausea or vomiting. Patient takes in the morning. 06/13/17  Yes Rehman, Mechele Dawley, MD  oxyCODONE (OXY IR/ROXICODONE) 5 MG immediate release tablet Take 5-15 mg by mouth every 8 (eight) hours as needed for moderate pain or severe pain (depends on pain level if takes 1-3 tablets).    Yes [provider]  promethazine (PHENERGAN) 25 MG tablet Take 25 mg by mouth every 8 (eight) hours as needed for nausea or vomiting (only takes when zofran does not work).    Yes [provider]  ranitidine (ZANTAC) 300 MG capsule Take 300 mg by mouth every evening.    Yes [provider]  sertraline (ZOLOFT) 100 MG tablet Take 50 mg by mouth daily. Takes 0.5 tablet   Yes [provider]  vitamin B-12 (CYANOCOBALAMIN) 500 MCG tablet Take 1,000 mcg by mouth daily.    Yes [provider]  mirtazapine (REMERON) 15 MG tablet TAKE 1 TABLET BY MOUTH EVERY DAY AT NIGHT 07/27/18   [provider]      Allergies  Allergen Reactions  . Ampicillin Hives, Shortness Of Breath and Swelling    Lips swelling Has patient had a PCN reaction causing immediate rash, facial/tongue/throat swelling, SOB or lightheadedness with hypotension: Yes Has  patient had a PCN reaction causing severe rash involving mucus membranes or skin necrosis: No Has patient had a PCN reaction that required hospitalization: No Has patient had a PCN reaction occurring within the last 10 years: Yes If all of the above answers are "NO", then may proceed with Cephalosporin use.   . Ciprofloxacin Hives  . Sulfamethoxazole Hives    ROS:  Out of a complete 14 system review of symptoms, the patient complains only of the following symptoms, and all other reviewed systems are negative.  Constipation Joint pain, joint swelling, aching muscles Numbness, weakness in both legs, right hand  Blood pressure (!) 184/93, pulse 82, resp. rate 18, height 5' 11.5" (1.816 m), weight 220 lb (99.8 kg).  Physical Exam  General: The patient is alert  and cooperative at the time of the examination.  The patient is markedly obese.  Eyes: Pupils are equal, round, and reactive to light. Discs are flat bilaterally.  Neck: The neck is supple, no carotid bruits are noted.  Respiratory: The respiratory examination is clear.  Cardiovascular: The cardiovascular examination reveals a regular rate and rhythm, no obvious murmurs or rubs are noted.  Neuromuscular: Range of movement the cervical spine is full.  Skin: Extremities are without significant edema.  Neurologic Exam  Mental status: The patient is alert and oriented x 3 at the time of the examination. The patient has apparent normal recent and remote memory, with an apparently normal attention span and concentration ability.  Cranial nerves: Facial symmetry is present. There is good sensation of the face to pinprick and soft touch on the left, decreased on the right. The strength of the facial muscles and the muscles to head turning and shoulder shrug are normal bilaterally. Speech is well enunciated, no aphasia or dysarthria is noted. Extraocular movements are full. Visual fields are full. The tongue is midline, and the  patient has symmetric elevation of the soft palate. No obvious hearing deficits are noted.  Motor: The motor testing reveals 5 over 5 strength of all 4 extremities. Good symmetric motor tone is noted throughout.  Sensory: Sensory testing is notable for some decrease in pinprick sensation on the right arm and right leg as compared to the left, vibration sensation is symmetric on arms, decreased on the right foot as compared to the left.  Position sensation is intact in all 4 extremities.  There is a stocking pattern pinprick sensory deficit up to the knees bilaterally.  No evidence of extinction is noted.  Coordination: Cerebellar testing reveals good finger-nose-finger and heel-to-shin bilaterally.  Gait and station: Gait is associated with a limping gait on the right leg.  Tandem gait is unsteady. Romberg is negative. No drift is seen.  Reflexes: Deep tendon reflexes are symmetric and normal bilaterally. Toes are downgoing bilaterally.   Assessment/Plan:  1.  Paresthesias, both hands, probable carpal tunnel syndrome  2.  Chronic low back pain, leg pain  The patient does have a prior history of hepatitis C, she could be at risk for an underlying peripheral neuropathy.  Nerve conduction studies will be done on both arms and one leg, EMG will be done on the right arm.  She will follow-up for the above study.  Jill Alexanders MD 08/18/2018 1:56 PM  [facility] [address] [place], [place] [address]  Phone [phone] Fax [phone]

## 2018-08-26 ENCOUNTER — Ambulatory Visit (INDEPENDENT_AMBULATORY_CARE_PROVIDER_SITE_OTHER): Payer: Medicare Other | Admitting: Neurology

## 2018-08-26 ENCOUNTER — Encounter: Payer: Self-pay | Admitting: Neurology

## 2018-08-26 DIAGNOSIS — G603 Idiopathic progressive neuropathy: Secondary | ICD-10-CM

## 2018-08-26 DIAGNOSIS — G5603 Carpal tunnel syndrome, bilateral upper limbs: Secondary | ICD-10-CM | POA: Insufficient documentation

## 2018-08-26 DIAGNOSIS — R202 Paresthesia of skin: Secondary | ICD-10-CM

## 2018-08-26 DIAGNOSIS — G629 Polyneuropathy, unspecified: Secondary | ICD-10-CM

## 2018-08-26 DIAGNOSIS — E538 Deficiency of other specified B group vitamins: Secondary | ICD-10-CM

## 2018-08-26 HISTORY — DX: Carpal tunnel syndrome, bilateral upper limbs: G56.03

## 2018-08-26 HISTORY — DX: Polyneuropathy, unspecified: G62.9

## 2018-08-26 NOTE — Progress Notes (Addendum)
The patient comes in today for EMG nerve conduction study evaluation.  The nerve conduction study shows evidence of a mild generalized peripheral neuropathy but there also appears to be bilateral carpal tunnel syndrome, right greater than left.  EMG evaluation of the right arm and right leg were relatively unremarkable.  The patient will be sent for blood work today to evaluate for other causes of neuropathy.  The patient does have a history of hepatitis C.  A prescription was given for wrist splints, she will call our office if her carpal tunnel syndrome symptoms are not improving within the next 2 months.     [place]    Nerve / Sites Muscle Latency Ref. Amplitude Ref. Rel Amp Segments Distance Velocity Ref. Area    ms ms mV mV %  cm m/s m/s mVms  L Median - APB     Wrist APB 6.3 ?4.4 4.6 ?4.0 100 Wrist - APB 7   14.2     Upper arm APB 10.8  3.9  84.3 Upper arm - Wrist 22 49 ?49 11.5  R Median - APB     Wrist APB 9.1 ?4.4 3.0 ?4.0 100 Wrist - APB 7   8.5     Upper arm APB 13.4  2.5  83 Upper arm - Wrist 22 51 ?49 6.9  L Ulnar - ADM     Wrist ADM 2.6 ?3.3 9.0 ?6.0 100 Wrist - ADM 7   30.5     B.Elbow ADM 6.0  7.7  86 B.Elbow - Wrist 18 52 ?49 28.9     A.Elbow ADM 7.9  7.8  101 A.Elbow - B.Elbow 10 53 ?49 28.3         A.Elbow - Wrist      R Ulnar - ADM     Wrist ADM 2.8 ?3.3 9.6 ?6.0 100 Wrist - ADM 7   28.4     B.Elbow ADM 6.3  7.9  82.3 B.Elbow - Wrist 18 51 ?49 27.3     A.Elbow ADM 8.3  7.6  96.2 A.Elbow - B.Elbow 10 51 ?49 25.7         A.Elbow - Wrist      R Peroneal - EDB     Ankle EDB 5.4 ?6.5 2.6 ?2.0 100 Ankle - EDB 9   10.9     Fib head EDB 14.8  2.3  86.4 Fib head - Ankle 34 36 ?44 10.5     Pop fossa EDB 17.0  2.6  116 Pop fossa - Fib head 10 45 ?44 13.5         Pop fossa - Ankle      R Tibial - AH     Ankle AH 4.9 ?5.8 7.0 ?4.0 100 Ankle - AH 9   22.6     Pop fossa AH 16.4  2.6  36.8 Pop fossa - Ankle 37 32 ?41 9.1                  SNC    Nerve / Sites Rec. Site Peak Lat  Ref.  Amp Ref. Segments Distance    ms ms V V  cm  R Sural - Ankle (Calf)     Calf Ankle 3.9 ?4.4 3 ?6 Calf - Ankle 14  R Superficial peroneal - Ankle     Lat leg Ankle NR ?4.4 NR ?6 Lat leg - Ankle 14  L Median - Orthodromic (Dig II, Mid palm)     Dig II Wrist  NR ?3.4 NR ?10 Dig II - Wrist 13  R Median - Orthodromic (Dig II, Mid palm)     Dig II Wrist NR ?3.4 NR ?10 Dig II - Wrist 13  L Ulnar - Orthodromic, (Dig V, Mid palm)     Dig V Wrist 2.8 ?3.1 6 ?5 Dig V - Wrist 11  R Ulnar - Orthodromic, (Dig V, Mid palm)     Dig V Wrist 2.8 ?3.1 8 ?5 Dig V - Wrist 68                 F  Wave    Nerve F Lat Ref.   ms ms  L Ulnar - ADM 29.2 ?32.0  R Ulnar - ADM 30.2 ?32.0  R Peroneal - EDB 58.2 ?56.0  R Tibial - AH 60.2 ?56.0

## 2018-08-26 NOTE — Progress Notes (Signed)
Please refer to EMG and nerve conduction procedure note.  

## 2018-08-26 NOTE — Procedures (Signed)
HISTORY:  Teresa Benitez is a 62 year old patient with a history of obesity who reports chronic neck and low back pain.  The patient has numbness in the hands and feet, she is being evaluated for a possible neuropathy or a radiculopathy.  NERVE CONDUCTION STUDIES:  Nerve conduction studies were performed on both upper extremities.  The distal motor latencies for the median nerves were prolonged bilaterally with a normal motor amplitude on the left, low on the right.  The distal motor latencies motor amplitudes for the ulnar nerves were normal bilaterally.  The nerve conduction velocities for the median and ulnar nerves were normal bilaterally.  The sensory latencies for the median nerves were unobtainable bilaterally and were normal for the ulnar nerves bilaterally.  The F-wave latencies for the ulnar nerves were normal bilaterally.  Nerve conduction studies were performed on the right lower extremity.  The distal motor latencies for the peroneal and posterior tibial nerves were normal with a low motor amplitude at the knee for the right posterior tibial nerve, normal below the knee, normal for the ulnar nerve.  Slowing was seen for the right posterior tibial nerve and for the right peroneal nerve below the knee only.  The sensory latency for the right sural nerve was normal but was absent for the right peroneal nerve.  The F-wave latencies for the right peroneal and posterior tibial nerves were prolonged.  EMG STUDIES:  EMG study was performed on the right upper extremity:  The first dorsal interosseous muscle reveals 2 to 4 K units with full recruitment. No fibrillations or positive waves were noted. The abductor pollicis brevis muscle reveals 2 to 4 K units with full recruitment. No fibrillations or positive waves were noted. The extensor indicis proprius muscle reveals 1 to 3 K units with full recruitment. No fibrillations or positive waves were noted. The pronator teres muscle  reveals 2 to 3 K units with full recruitment. No fibrillations or positive waves were noted. The biceps muscle reveals 1 to 2 K units with full recruitment. No fibrillations or positive waves were noted. The triceps muscle reveals 2 to 4 K units with full recruitment. No fibrillations or positive waves were noted. The anterior deltoid muscle reveals 2 to 3 K units with full recruitment. No fibrillations or positive waves were noted. The cervical paraspinal muscles were tested at 2 levels. No abnormalities of insertional activity were seen at either level tested. There was good relaxation.  EMG study was performed on the right lower extremity:  The tibialis anterior muscle reveals 2 to 5K motor units with slightly decreased recruitment. No fibrillations or positive waves were seen. The peroneus tertius muscle reveals 2 to 5K motor units with slightly decreased recruitment. No fibrillations or positive waves were seen. The medial gastrocnemius muscle reveals 1 to 3K motor units with full recruitment. No fibrillations or positive waves were seen. The vastus lateralis muscle reveals 2 to 4K motor units with full recruitment. No fibrillations or positive waves were seen. The iliopsoas muscle reveals 2 to 4K motor units with full recruitment. No fibrillations or positive waves were seen. The biceps femoris muscle (long head) reveals 2 to 4K motor units with full recruitment. No fibrillations or positive waves were seen. The lumbosacral paraspinal muscles were tested at 3 levels, and revealed no abnormalities of insertional activity at all 3 levels tested. There was good relaxation.  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  There appears to be evidence of bilateral carpal tunnel syndrome, relatively mild on the left and moderate  severity on the right.  EMG evaluation of the right upper and right lower extremities were relatively unremarkable without evidence of an underlying radiculopathy.  In the right lower extremity there was mild distal signs of neuropathic denervation consistent with the diagnosis of peripheral neuropathy.  Kathrynn Ducking

## 2018-08-28 ENCOUNTER — Telehealth: Payer: Self-pay | Admitting: Neurology

## 2018-08-28 LAB — ANGIOTENSIN CONVERTING ENZYME: Angio Convert Enzyme: 5 U/L — ABNORMAL LOW (ref 14–82)

## 2018-08-28 LAB — MULTIPLE MYELOMA PANEL, SERUM
Albumin SerPl Elph-Mcnc: 3.3 g/dL (ref 2.9–4.4)
Albumin/Glob SerPl: 0.9 (ref 0.7–1.7)
Alpha 1: 0.3 g/dL (ref 0.0–0.4)
Alpha2 Glob SerPl Elph-Mcnc: 0.8 g/dL (ref 0.4–1.0)
B-Globulin SerPl Elph-Mcnc: 1.2 g/dL (ref 0.7–1.3)
Gamma Glob SerPl Elph-Mcnc: 1.8 g/dL (ref 0.4–1.8)
Globulin, Total: 4.1 g/dL — ABNORMAL HIGH (ref 2.2–3.9)
IgA/Immunoglobulin A, Serum: 441 mg/dL — ABNORMAL HIGH (ref 87–352)
IgG (Immunoglobin G), Serum: 1615 mg/dL — ABNORMAL HIGH (ref 700–1600)
IgM (Immunoglobulin M), Srm: 113 mg/dL (ref 26–217)
Total Protein: 7.4 g/dL (ref 6.0–8.5)

## 2018-08-28 LAB — ANA W/REFLEX: Anti Nuclear Antibody(ANA): NEGATIVE

## 2018-08-28 LAB — VITAMIN B12: Vitamin B-12: 699 pg/mL (ref 232–1245)

## 2018-08-28 LAB — HEMOGLOBIN A1C
Est. average glucose Bld gHb Est-mCnc: 126 mg/dL
Hgb A1c MFr Bld: 6 % — ABNORMAL HIGH (ref 4.8–5.6)

## 2018-08-28 LAB — B. BURGDORFI ANTIBODIES: Lyme IgG/IgM Ab: <0.91 {ISR} (ref 0.00–0.90)

## 2018-08-28 LAB — SEDIMENTATION RATE: Sed Rate: 51 mm/hr — ABNORMAL HIGH (ref 0–40)

## 2018-08-28 NOTE — Telephone Encounter (Signed)
I called the patient.  The blood work shows a slight elevation of the sedimentation rate, polyclonal gammopathy, these findings could be related to the prior history of hepatitis C.  The hemoglobin A1c is slightly elevated in the prediabetic range, the patient should be on a low carbohydrate diet and exercise regularly.  The prediabetes and hepatitis C could be a potential source of the peripheral neuropathy.

## 2018-12-02 ENCOUNTER — Encounter (INDEPENDENT_AMBULATORY_CARE_PROVIDER_SITE_OTHER): Payer: Self-pay | Admitting: Internal Medicine

## 2018-12-02 ENCOUNTER — Encounter (INDEPENDENT_AMBULATORY_CARE_PROVIDER_SITE_OTHER): Payer: Self-pay | Admitting: *Deleted

## 2018-12-02 ENCOUNTER — Ambulatory Visit (INDEPENDENT_AMBULATORY_CARE_PROVIDER_SITE_OTHER): Payer: Medicare Other | Admitting: Internal Medicine

## 2018-12-02 VITALS — BP 165/83 | HR 82 | Temp 98.1°F | Resp 18 | Ht 71.5 in | Wt 224.8 lb

## 2018-12-02 DIAGNOSIS — K7460 Unspecified cirrhosis of liver: Secondary | ICD-10-CM

## 2018-12-02 DIAGNOSIS — R110 Nausea: Secondary | ICD-10-CM

## 2018-12-02 DIAGNOSIS — R109 Unspecified abdominal pain: Secondary | ICD-10-CM

## 2018-12-02 NOTE — Patient Instructions (Signed)
Abdominal ultrasound to be scheduled. Patient will call with results of blood test and ultrasound when completed. Try using over-the-counter lidocaine patch which is 4%.

## 2018-12-02 NOTE — Progress Notes (Signed)
Presenting complaint;  Follow-up for cirrhosis nausea and left-sided abdominal pain.  Subjective:  Patient is 63 year old Caucasian female who is here for scheduled visit.  She was last seen in September 2019.  She continues to complain of pain in left upper quadrant and flank.  She says she has mentioned this to her pain specialist but no treatment has been recommended.  She was given Lidoderm prescription but was not covered.  She says pain is like electric shock in the left flank and medially it is like a burning.  This pain is not relieved with pain medication or bowel movement.  She continues to complain of nausea but she never vomits.  She feels dicyclomine has helped with stool frequency.  She generally takes 1-2 doses a day.  She is on gabapentin for peripheral neuropathy.  She says now she is having right knee pain because of Baker's cyst and she also has developed bilateral carpal tunnel syndrome.  She says she takes 1-2 doses of Aleve every month.  She has gained 6 pounds in the last 6 months.  She says she eats too much junk food at night.  Current Medications: Outpatient Encounter Medications as of 12/02/2018  Medication Sig  . alum & mag hydroxide-simeth (MAALOX/MYLANTA) 200-200-20 MG/5ML suspension Take 15 mLs by mouth every 6 (six) hours as needed for indigestion or heartburn.  . BELBUCA 150 MCG FILM PLACE 1 FILM ON TONGUE TWO TIMES DAILY  . budesonide-formoterol (SYMBICORT) 160-4.5 MCG/ACT inhaler Inhale 1 puff into the lungs daily as needed (FOR SHORTNESS OF BREATH).   . Cholecalciferol (VITAMIN D PO) Take 1,000 Units by mouth daily.  Marland Kitchen dicyclomine (BENTYL) 10 MG capsule TAKE 1 CAPSULE BY MOUTH THREE TIMES DAILY AS NEEDED FOR  SPASMS  . gabapentin (NEURONTIN) 300 MG capsule Take 1,200 mg by mouth 3 (three) times daily.   Marland Kitchen levothyroxine (SYNTHROID, LEVOTHROID) 75 MCG tablet Take 75 mcg by mouth daily before breakfast.  . lisinopril (PRINIVIL,ZESTRIL) 30 MG tablet Take 40 mg by  mouth daily.   Marland Kitchen lisinopril-hydrochlorothiazide (PRINZIDE,ZESTORETIC) 20-25 MG per tablet Take 1 tablet by mouth daily.  . methocarbamol (ROBAXIN) 500 MG tablet Take 500-750 mg by mouth 2 (two) times daily as needed for muscle spasms (takes 1 tablet in the morning and 1.5 tablet at bedtime). Takes a dose every morning and another dose at bedtime only if needed for pain  . metoCLOPramide (REGLAN) 5 MG tablet Take 1 tablet (5 mg total) by mouth every 6 (six) hours as needed for nausea.  . mirtazapine (REMERON) 15 MG tablet TAKE 1 TABLET BY MOUTH EVERY DAY AT NIGHT  . Multiple Vitamin (MULTI-VITAMINS) TABS Take 1 tablet by mouth daily.  . naproxen sodium (ANAPROX) 220 MG tablet Take 440 mg by mouth daily as needed (pain).  . ondansetron (ZOFRAN) 4 MG tablet Take 1 tablet (4 mg total) by mouth every 8 (eight) hours as needed for nausea or vomiting. Patient takes in the morning.  Marland Kitchen oxyCODONE (OXY IR/ROXICODONE) 5 MG immediate release tablet Take 5-15 mg by mouth every 8 (eight) hours as needed for moderate pain or severe pain (depends on pain level if takes 1-3 tablets).   . promethazine (PHENERGAN) 25 MG tablet Take 25 mg by mouth every 8 (eight) hours as needed for nausea or vomiting (only takes when zofran does not work).   . ranitidine (ZANTAC) 300 MG capsule Take 300 mg by mouth every evening.   . sertraline (ZOLOFT) 100 MG tablet Take 50 mg by mouth  daily. Takes 0.5 tablet  . vitamin B-12 (CYANOCOBALAMIN) 500 MCG tablet Take 1,000 mcg by mouth daily.   . [DISCONTINUED] linaclotide (LINZESS) 72 MCG capsule Take 1 capsule (72 mcg total) by mouth daily before breakfast. (Patient not taking: Reported on 12/02/2018)   No facility-administered encounter medications on file as of 12/02/2018.      Objective: Blood pressure (!) 165/83, pulse 82, temperature 98.1 F (36.7 C), temperature source Oral, resp. rate 18, height 5' 11.5" (1.816 m), weight 224 lb 12.8 oz (102 kg). Patient is alert and in no acute  distress. She does not have asterixis. Conjunctiva is pink. Sclera is nonicteric Oropharyngeal mucosa is normal. No neck masses or thyromegaly noted. Cardiac exam with regular rhythm normal S1 and S2. No murmur or gallop noted. Lungs are clear to auscultation. Abdomen is full.  She has tenderness at left flank and below the left costal margin.  Tenderness is very superficial.  No organomegaly or masses. No LE edema or clubbing noted.  Lab data From 06/05/2018 WBC 5.4, H&H 13.7 and 39.8 and platelet count 110 K. Serum creatinine 0.72 Bilirubin 0.6, AP 92, AST 21, ALT 10 total protein 7.6 and albumin 3.9. Alpha-fetoprotein 3.5.  Assessment:  #1.  Left-sided abdominal pain appears to be predominantly due to abdominal wall pain and she possibly has neuropathy.  She can try over-the-counter lidocaine patch which is 4%.  If it works she can use it on as-needed basis.  Since she is getting some hernia with dicyclomine some of the pain may be due to IBS.  #2.  Chronic nausea without vomiting.  Suspect nausea is due to medications.  She has a history of gastric ulcer but it has been documented to have healed.  #3.  Cirrhosis.  She has well compensated disease.  She has history of hepatitis C for which she has been successfully treated several years ago.  Other risk factor includes fatty liver.  She does have thrombocytopenia secondary to cirrhosis.  She is due for [facility] screening.    Plan:  Alpha-fetoprotein. Abdominal ultrasound. Patient will try lidocaine patch which is 4%.  She was instructed how to use it.  It would be 24 hours on and 24 hours off. Office visit in 6 months.

## 2018-12-03 LAB — AFP TUMOR MARKER: AFP-Tumor Marker: 3.8 ng/mL

## 2018-12-08 ENCOUNTER — Ambulatory Visit (HOSPITAL_COMMUNITY): Payer: Medicare Other

## 2018-12-15 ENCOUNTER — Other Ambulatory Visit (INDEPENDENT_AMBULATORY_CARE_PROVIDER_SITE_OTHER): Payer: Self-pay | Admitting: Internal Medicine

## 2018-12-15 DIAGNOSIS — R109 Unspecified abdominal pain: Secondary | ICD-10-CM

## 2018-12-22 ENCOUNTER — Encounter

## 2018-12-22 ENCOUNTER — Other Ambulatory Visit: Payer: Self-pay

## 2018-12-22 ENCOUNTER — Telehealth: Payer: Self-pay | Admitting: Neurology

## 2018-12-22 ENCOUNTER — Ambulatory Visit (INDEPENDENT_AMBULATORY_CARE_PROVIDER_SITE_OTHER): Payer: Medicare Other | Admitting: Neurology

## 2018-12-22 ENCOUNTER — Encounter: Payer: Self-pay | Admitting: Neurology

## 2018-12-22 DIAGNOSIS — B182 Chronic viral hepatitis C: Secondary | ICD-10-CM

## 2018-12-22 DIAGNOSIS — M5410 Radiculopathy, site unspecified: Secondary | ICD-10-CM | POA: Diagnosis not present

## 2018-12-22 DIAGNOSIS — G5603 Carpal tunnel syndrome, bilateral upper limbs: Secondary | ICD-10-CM

## 2018-12-22 DIAGNOSIS — G603 Idiopathic progressive neuropathy: Secondary | ICD-10-CM

## 2018-12-22 MED ORDER — CARBAMAZEPINE 200 MG PO TABS: ORAL_TABLET | ORAL | 3 refills | Status: DC

## 2018-12-22 NOTE — Telephone Encounter (Signed)
Pt called in and stated that she has had quite a bit of muscle weakness in both arms and legs

## 2018-12-22 NOTE — Telephone Encounter (Signed)
Medicare/medicaid order sent to GI. No auth they will reach out to the pt to schedule.  °

## 2018-12-22 NOTE — Telephone Encounter (Signed)
I called the patient.  The patient is having intermittent episodes of diffuse weakness of the arms and legs, this will last for about 15 minutes and then go away.  The patient will have to sit down, she does not feel that she can continue to walk.  She denies any dizziness or palpitations of the heart.  She has not checked her blood pressure during the events.  I have asked her to check the blood pressure and pulse with these events, they sound to me like she may be having brief episodes of hypotension.

## 2018-12-22 NOTE — Telephone Encounter (Signed)
I contacted the pt and she states she forgot to mention during her phone call to Dr. Jannifer Franklin about her arms/legs being week.

## 2018-12-22 NOTE — Progress Notes (Signed)
      Virtual Visit via Telephone Note  I connected with Teresa Benitez on 12/22/18 at  8:00 AM EDT by telephone and verified that I am speaking with the correct person using two identifiers.   I discussed the limitations, risks, security and privacy concerns of performing an evaluation and management service by telephone and the availability of in person appointments. I also discussed with the patient that there may be a patient responsible charge related to this service. The patient expressed understanding and agreed to proceed.   History of Present Illness: Teresa Benitez is a 63 year old right-handed white female with a history of hepatitis C, fibromyalgia, and evidence of a peripheral neuropathy by nerve conduction studies.  The patient has carpal tunnel syndrome bilaterally, right greater than left and she continues to have symptoms with this even after using the wrist splints.  She is now having some discomfort into the elbows and even to the shoulders at times.  The hands, particular on the right, will wake her up at night with numbness and tingling.  She denies any significant discomfort in the feet associated with neuropathy.  She denies any balance issues or difficulty with stumbles or falls.  She continues to have pain associated with a dull achy sensation and throbbing from the back to front from the upper abdomen down to the lower abdomen, occasionally with sensitivity to light touch of the skin and occasional shock sensations.  She does not have pain on the right.  Previously, in 2012, the patient had MRI of the thoracic spine showing disc herniations at 2 levels without significant spinal cord compression.  The patient is on maximum doses of gabapentin currently.   Observations/Objective: Over the telephone, the patient was alert and cooperative, responding to questions appropriately.  The patient has no evidence of dysarthria or aphasia.  Assessment and Plan: 1.  Peripheral  neuropathy  2.  Bilateral carpal tunnel syndrome, right greater than left  3.  History of fibromyalgia  4.  Left back and abdomen pain  5.  History of hepatitis C  The patient has ongoing discomfort with the carpal tunnel syndrome on the right and with what sounds like a neuropathic pain on the left abdomen and back.  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  The last MRI of the thoracic spine was done in 2012.  The patient will be set up for repeat MRI of the thoracic spine.  Carbamazepine will be added to the regimen taking 200 mg, 1/2 tablet twice daily for 2 weeks and then 1 full tablet twice daily.  The patient will be set up to see a hand surgeon for the carpal tunnel syndrome.  She will follow-up here in about 4 months.  Follow Up Instructions: Follow-up in 4 months.   I discussed the assessment and treatment plan with the patient. The patient was provided an opportunity to ask questions and all were answered. The patient agreed with the plan and demonstrated an understanding of the instructions.   The patient was advised to call back or seek an in-person evaluation if the symptoms worsen or if the condition fails to improve as anticipated.  I provided 25 minutes of non-face-to-face time during this encounter.   Kathrynn Ducking, MD

## 2018-12-23 NOTE — Telephone Encounter (Signed)
MRI of the thoracic spine was reordered with and without contrast.

## 2018-12-23 NOTE — Telephone Encounter (Signed)
Can you switch the Thoracic order to wo contrast or w/wo contrast. They don't do just with contrast.

## 2018-12-23 NOTE — Addendum Note (Signed)
Addended by: Kathrynn Ducking on: 12/23/2018 01:03 PM   Modules accepted: Orders

## 2018-12-29 ENCOUNTER — Telehealth: Payer: Self-pay | Admitting: Neurology

## 2018-12-29 MED ORDER — PHENYTOIN SODIUM EXTENDED 100 MG PO CAPS: 100.0000 mg | ORAL_CAPSULE | Freq: Two times a day (BID) | ORAL | 2 refills | Status: DC

## 2018-12-29 NOTE — Telephone Encounter (Signed)
Pt said since starting carbamazepine (TEGRETOL) 200 MG tablet on Friday she has been very nauseated and started itching yesterday. She took benadryl yesterday. She did not take the medication today. She said it did help the pain in her side. Pt states she's had HA for the past 2 days. Please call to advise

## 2018-12-29 NOTE — Telephone Encounter (Signed)
I called the patient.  The patient indicates that she got excellent pain improvement with the carbamazepine but it began causing headaches and nausea.  She also started itching, no rash was noted.  We will stop the carbamazepine, start Dilantin instead to see if this helps.

## 2019-01-27 MED ORDER — LEVETIRACETAM 250 MG PO TABS: 250.0000 mg | ORAL_TABLET | Freq: Two times a day (BID) | ORAL | 1 refills | Status: DC

## 2019-01-27 NOTE — Telephone Encounter (Signed)
I called the patient.  The patient will be placed on Keppra taking 250 mg twice daily, she will stop the Dilantin.  She will call me for any dose adjustments of the medication.

## 2019-01-27 NOTE — Telephone Encounter (Signed)
I contacted the pt in regards to this message. Pt states 1 week ago she d/c the dilantin due to nausea.  Pt reports she tried taking the med with food and without food, but nausea was still present. Pt also reports increase in her depression while taking the Dilantin.  Pt did state the dilantin helped with her neuropathy type pain, but she could not deal with the constant nausea and emotional side effects.  I advised pt I would send message to MD to further review and to advise on how to proceed. Pt was agreeable.

## 2019-01-27 NOTE — Addendum Note (Signed)
Addended by: Kathrynn Ducking on: 01/27/2019 04:47 PM   Modules accepted: Orders

## 2019-01-27 NOTE — Addendum Note (Signed)
Addended by: Kathrynn Ducking on: 01/27/2019 04:33 PM   Modules accepted: Orders

## 2019-01-27 NOTE — Telephone Encounter (Signed)
Pt called in and stated she stopped the  Phenytoin Sodium Extended she states it made her really sick

## 2019-01-27 NOTE — Telephone Encounter (Signed)
I called the patient, left a message.  I will call back later.  The patient cannot tolerate the Dilantin, may consider a course of Keppra for the neuropathy or neuralgia pain.

## 2019-02-19 ENCOUNTER — Other Ambulatory Visit: Payer: Self-pay | Admitting: Neurology

## 2019-03-19 ENCOUNTER — Other Ambulatory Visit: Payer: Self-pay | Admitting: Neurology

## 2019-04-01 ENCOUNTER — Other Ambulatory Visit: Payer: Self-pay | Admitting: Neurology

## 2019-04-14 ENCOUNTER — Telehealth: Payer: Self-pay | Admitting: Neurology

## 2019-04-14 NOTE — Telephone Encounter (Signed)
I called patient and LVM to reschedule 7/30 appt with Judson Roch, due to Judson Roch being out that afternoon. Requested patient call back to reschedule.

## 2019-04-23 ENCOUNTER — Ambulatory Visit: Payer: Medicare Other | Admitting: Neurology

## 2019-04-28 NOTE — Progress Notes (Signed)
PATIENT: Teresa Benitez DOB: 06-21-1956  REASON FOR VISIT: follow up HISTORY FROM: patient  HISTORY OF PRESENT ILLNESS: Today 04/29/19  Teresa Benitez is a 63 year old female with history of hepatitis C, fibromyalgia, and evidence of a peripheral neuropathy with nerve conduction studies. She has bilateral carpal tunnel syndrome, right is greater than left.  She is on the maximum dose of gabapentin.  Previously in 2012 she had MRI of her thoracic spine showing disc herniations at 2 levels without significant spinal cord compression.  She continues to complain of pain associated with dull achy sensation and throbbing from the back to the front of her upper abdomen down to her lower abdomen.  Repeat MRI of the thoracic spine was ordered but not completed.  She was unable to tolerate carbamazepine or Dilantin for the neuropathic pain.  She was referred to a Copy.  She remains on Keppra 250 mg twice a day. The Keppra helps with the pain, but there are times she doesn't like the way it makes her feel. She thought it was making her nauseated, but turns out it wasn't that. She has increased her Zoloft to 100 mg. She recently found a lump to her left upper leg. She is pending an ultrasound. She did see hand surgeon for carpal tunnel and had injection to right hand, is scheduled for the left. Of most concern is her left abdominal pain and left leg pain. She sees pain management. The Keppra has done well to help the pain on her left side. She has been treated for Hepatitis C. she presents today for follow-up unaccompanied.  HISTORY 12/22/2018 Dr. Jannifer Franklin: Teresa Benitez is a 63 year old right-handed white female with a history of hepatitis C, fibromyalgia, and evidence of a peripheral neuropathy by nerve conduction studies.  The patient has carpal tunnel syndrome bilaterally, right greater than left and she continues to have symptoms with this even after using the wrist splints.  She is now having some  discomfort into the elbows and even to the shoulders at times.  The hands, particular on the right, will wake her up at night with numbness and tingling.  She denies any significant discomfort in the feet associated with neuropathy.  She denies any balance issues or difficulty with stumbles or falls.  She continues to have pain associated with a dull achy sensation and throbbing from the back to front from the upper abdomen down to the lower abdomen, occasionally with sensitivity to light touch of the skin and occasional shock sensations.  She does not have pain on the right.  Previously, in 2012, the patient had MRI of the thoracic spine showing disc herniations at 2 levels without significant spinal cord compression.  The patient is on maximum doses of gabapentin currently.  REVIEW OF SYSTEMS: Out of a complete 14 system review of symptoms, the patient complains only of the following symptoms, and all other reviewed systems are negative.  Abdominal pain, cyst, back pain  ALLERGIES: Allergies  Allergen Reactions  . Ampicillin Hives, Shortness Of Breath and Swelling    Lips swelling Has patient had a PCN reaction causing immediate rash, facial/tongue/throat swelling, SOB or lightheadedness with hypotension: Yes Has patient had a PCN reaction causing severe rash involving mucus membranes or skin necrosis: No Has patient had a PCN reaction that required hospitalization: No Has patient had a PCN reaction occurring within the last 10 years: Yes If all of the above answers are "NO", then may proceed with Cephalosporin use.   Marland Kitchen  Carbamazepine     Nausea, headache  . Ciprofloxacin Hives  . Dilantin [Phenytoin Sodium Extended]     nausea  . Sulfamethoxazole Hives    HOME MEDICATIONS: Outpatient Medications Prior to Visit  Medication Sig Dispense Refill  . alum & mag hydroxide-simeth (MAALOX/MYLANTA) 200-200-20 MG/5ML suspension Take 15 mLs by mouth every 6 (six) hours as needed for indigestion or  heartburn.    . BELBUCA 150 MCG FILM PLACE 1 FILM ON TONGUE TWO TIMES DAILY  0  . budesonide-formoterol (SYMBICORT) 160-4.5 MCG/ACT inhaler Inhale 1 puff into the lungs daily as needed (FOR SHORTNESS OF BREATH).     . Cholecalciferol (VITAMIN D PO) Take 1,000 Units by mouth daily.    Marland Kitchen dicyclomine (BENTYL) 10 MG capsule TAKE 1 CAPSULE BY MOUTH THREE TIMES DAILY AS NEEDED FOR  SPASMS 90 capsule 2  . gabapentin (NEURONTIN) 300 MG capsule Take 1,200 mg by mouth 3 (three) times daily.     Marland Kitchen levETIRAcetam (KEPPRA) 250 MG tablet TAKE 1 TABLET BY MOUTH TWICE A DAY 60 tablet 4  . levothyroxine (SYNTHROID, LEVOTHROID) 75 MCG tablet Take 75 mcg by mouth daily before breakfast.    . lisinopril (PRINIVIL,ZESTRIL) 30 MG tablet Take 40 mg by mouth daily.     . methocarbamol (ROBAXIN) 500 MG tablet Take 500-750 mg by mouth 2 (two) times daily as needed for muscle spasms (takes 1 tablet in the morning and 1.5 tablet at bedtime). Takes a dose every morning and another dose at bedtime only if needed for pain    . metoCLOPramide (REGLAN) 5 MG tablet Take 1 tablet (5 mg total) by mouth every 6 (six) hours as needed for nausea.    . mirtazapine (REMERON) 15 MG tablet TAKE 1 TABLET BY MOUTH EVERY DAY AT NIGHT  2  . Multiple Vitamin (MULTI-VITAMINS) TABS Take 1 tablet by mouth daily.    . naproxen sodium (ANAPROX) 220 MG tablet Take 440 mg by mouth daily as needed (pain).    . ondansetron (ZOFRAN) 4 MG tablet Take 1 tablet (4 mg total) by mouth every 8 (eight) hours as needed for nausea or vomiting. Patient takes in the morning. 30 tablet 2  . oxyCODONE (OXY IR/ROXICODONE) 5 MG immediate release tablet Take 5-15 mg by mouth every 8 (eight) hours as needed for moderate pain or severe pain (depends on pain level if takes 1-3 tablets).     . promethazine (PHENERGAN) 25 MG tablet Take 25 mg by mouth every 8 (eight) hours as needed for nausea or vomiting (only takes when zofran does not work).     . sertraline (ZOLOFT) 100 MG  tablet Take 100 mg by mouth daily.     . vitamin B-12 (CYANOCOBALAMIN) 500 MCG tablet Take 1,000 mcg by mouth daily.     Marland Kitchen amLODipine (NORVASC) 5 MG tablet Take 5 mg by mouth daily.    Marland Kitchen lisinopril-hydrochlorothiazide (PRINZIDE,ZESTORETIC) 20-25 MG per tablet Take 1 tablet by mouth daily.     No facility-administered medications prior to visit.     PAST MEDICAL HISTORY: Past Medical History:  Diagnosis Date  . Carpal tunnel syndrome, bilateral 08/26/2018  . Chest pain    negative myoview 02/14/2015  . Chronic constipation   . Cirrhosis ([place])   . DDD (degenerative disc disease), lumbar   . Depression   . Fibromyalgia   . Hepatitis C    treated, none since 2016  . Hepatitis C approx. 2004  . History of kidney stones   . Hypertension   .  Hypothyroidism   . Liver disease   . Peripheral neuropathy 08/26/2018  . Spleen enlarged   . Thrombocytopenia ([place])   . Vision abnormalities     PAST SURGICAL HISTORY: Past Surgical History:  Procedure Laterality Date  . CARDIAC CATHETERIZATION     with stent  . CESAREAN SECTION  1996  . COLONOSCOPY  5-6 years ago    Done In [place]  . ESOPHAGOGASTRODUODENOSCOPY (EGD) WITH PROPOFOL N/A 08/23/2017   Procedure: ESOPHAGOGASTRODUODENOSCOPY (EGD) WITH PROPOFOL;  Surgeon: Rogene Houston, MD;  Location: [facility];  Service: Endoscopy;  Laterality: N/A;  . Para Thyhoid  2005  . PARTIAL HYSTERECTOMY     Patient states that she had 16 years ago?1998  . UPPER GASTROINTESTINAL ENDOSCOPY      FAMILY HISTORY: Family History  Problem Relation Age of Onset  . Healthy Mother   . Diabetes Father   . Heart disease Father   . Hypertension Father   . Healthy Brother   . Healthy Brother   . Healthy Daughter   . Healthy Son     SOCIAL HISTORY: Social History   Socioeconomic History  . Marital status: Divorced    Spouse name: Not on file  . Number of children: Not on file  . Years of education: Not on file  . Highest education level:  Not on file  Occupational History  . Not on file  Social Needs  . Financial resource strain: Not on file  . Food insecurity    Worry: Not on file    Inability: Not on file  . Transportation needs    Medical: Not on file    Non-medical: Not on file  Tobacco Use  . Smoking status: Current Every Day Smoker    Packs/day: 1.00    Years: 44.00    Pack years: 44.00    Types: Cigarettes  . Smokeless tobacco: Never Used  . Tobacco comment: Patient states that she smokes 2 pack a day , has smoked since she was 45.  Substance and Sexual Activity  . Alcohol use: No  . Drug use: No  . Sexual activity: Not on file  Lifestyle  . Physical activity    Days per week: Not on file    Minutes per session: Not on file  . Stress: Not on file  Relationships  . Social Herbalist on phone: Not on file    Gets together: Not on file    Attends religious service: Not on file    Active member of club or organization: Not on file    Attends meetings of clubs or organizations: Not on file    Relationship status: Not on file  . Intimate partner violence    Fear of current or ex partner: Not on file    Emotionally abused: Not on file    Physically abused: Not on file    Forced sexual activity: Not on file  Other Topics Concern  . Not on file  Social History Narrative  . Not on file    PHYSICAL EXAM  Vitals:   04/29/19 0858  BP: (!) 179/94  Pulse: 80  Temp: 98.4 F (36.9 C)  TempSrc: Oral  Weight: 230 lb 6.4 oz (104.5 kg)  Height: 5' 11.5" (1.816 m)   Body mass index is 31.69 kg/m.  Generalized: Well developed, in no acute distress   Neurological examination  Mentation: Alert oriented to time, place, history taking. Follows all commands speech and language fluent Cranial  nerve II-XII: Pupils were equal round reactive to light. Extraocular movements were full, visual field were full on confrontational test. Facial sensation and strength were normal.  Head turning and shoulder  shrug  were normal and symmetric. Motor: The motor testing reveals 5 over 5 strength of all 4 extremities. Good symmetric motor tone is noted throughout.  Sensory: Sensory testing is intact to soft touch on all 4 extremities. No evidence of extinction is noted.  Tenderness to left flank, dime sized nodule to left mid flank, very tender.  Coordination: Cerebellar testing reveals good finger-nose-finger and heel-to-shin bilaterally.  Gait and station: Gait is normal. Tandem gait is unsteady.  Able to walk on heels and tiptoes. Reflexes: Deep tendon reflexes are symmetric and normal bilaterally.   DIAGNOSTIC DATA (LABS, IMAGING, TESTING) - I reviewed patient records, labs, notes, testing and imaging myself where available.  Lab Results  Component Value Date   WBC 5.4 06/05/2018   HGB 13.7 06/05/2018   HCT 39.8 06/05/2018   MCV 85.8 06/05/2018   PLT 110 (L) 06/05/2018      Component Value Date/Time   NA 136 06/05/2018 1256   K 4.4 06/05/2018 1256   CL 98 06/05/2018 1256   CO2 31 06/05/2018 1256   GLUCOSE 105 (H) 06/05/2018 1256   BUN 8 06/05/2018 1256   CREATININE 0.72 06/05/2018 1256   CALCIUM 9.6 06/05/2018 1256   PROT 7.4 08/26/2018 1409   ALBUMIN 3.3 (L) 10/21/2013 0609   AST 21 06/05/2018 1256   ALT 10 06/05/2018 1256   ALKPHOS 83 10/21/2013 0609   BILITOT 0.6 06/05/2018 1256   GFRNONAA >60 08/19/2017 1022   GFRAA >60 08/19/2017 1022   No results found for: CHOL, HDL, LDLCALC, LDLDIRECT, TRIG, CHOLHDL Lab Results  Component Value Date   HGBA1C 6.0 (H) 08/26/2018   Lab Results  Component Value Date   VITAMINB12 699 08/26/2018   Lab Results  Component Value Date   TSH 3.26 06/13/2017      ASSESSMENT AND PLAN 63 y.o. year old female  has a past medical history of Carpal tunnel syndrome, bilateral (08/26/2018), Chest pain, Chronic constipation, Cirrhosis ([place]), DDD (degenerative disc disease), lumbar, Depression, Fibromyalgia, Hepatitis C, Hepatitis C (approx. 2004),  History of kidney stones, Hypertension, Hypothyroidism, Liver disease, Peripheral neuropathy (08/26/2018), Spleen enlarged, Thrombocytopenia ([place]), and Vision abnormalities. here with:  1.  Peripheral Neuropathy 2.  Bilateral carpal tunnel syndrome, right greater than left 3.  History of fibromyalgia 4.  Left back and abdominal pain  She continues to complain of left abdominal and back pain.  MRI of the thoracic spine was ordered but was not completed.  The order is still good, we will move forward with trying to get the scan.  I will order BMP to check kidney function beforehand.  She has found the Keppra to be beneficial for her pain, I will go up on the dose she will take 250 mg in the morning, 500 mg in the evening.  I sent in a prescription.  In regards to the nodules on the left side of her abdomen, I have suggested she discuss this with her primary care doctor.  She does have a nodule to be left upper leg, she is pending ultrasound this week.  She says these are not new, have been there for years. She says she has undergone GI workup without determination of etiology of pain.  She will follow-up in 6 months or sooner if needed.  I advised that if her  symptoms worsen or if any new symptoms she should let us know.   I spent 25 minutes with the patient. 50% of this time was spent discussing her plan of care.   Butler Denmark, AGNP-C, DNP 04/29/2019, 9:11 AM [facility] [address], [place], [place] [address] [phone]

## 2019-04-29 ENCOUNTER — Other Ambulatory Visit: Payer: Self-pay

## 2019-04-29 ENCOUNTER — Telehealth: Payer: Self-pay | Admitting: Neurology

## 2019-04-29 ENCOUNTER — Ambulatory Visit (INDEPENDENT_AMBULATORY_CARE_PROVIDER_SITE_OTHER): Payer: Medicare Other | Admitting: Neurology

## 2019-04-29 ENCOUNTER — Encounter: Payer: Self-pay | Admitting: Neurology

## 2019-04-29 VITALS — BP 179/94 | HR 80 | Temp 98.4°F | Ht 71.5 in | Wt 230.4 lb

## 2019-04-29 DIAGNOSIS — G8929 Other chronic pain: Secondary | ICD-10-CM

## 2019-04-29 DIAGNOSIS — M549 Dorsalgia, unspecified: Secondary | ICD-10-CM | POA: Insufficient documentation

## 2019-04-29 DIAGNOSIS — G603 Idiopathic progressive neuropathy: Secondary | ICD-10-CM | POA: Diagnosis not present

## 2019-04-29 DIAGNOSIS — M546 Pain in thoracic spine: Secondary | ICD-10-CM | POA: Diagnosis not present

## 2019-04-29 MED ORDER — LEVETIRACETAM 250 MG PO TABS: ORAL_TABLET | ORAL | 4 refills | Status: DC

## 2019-04-29 NOTE — Patient Instructions (Signed)
1. We will increase your Keppra 250 mg in the morning, 500 mg in the evening 2. We will get MRI of thoracic spine 3. We will check BMP today 4. Discuss with your primary about the nodules to your left side 5. You are pending the [place] of your left leg nodule 6. We will see you in 6 months

## 2019-04-29 NOTE — Telephone Encounter (Signed)
Medicare/medicaid order sent to GI. No auth they will reach out to the patient to schedule.  °

## 2019-04-29 NOTE — Progress Notes (Signed)
I have read the note, and I agree with the clinical assessment and plan.  Soriyah Osberg K Maryori Weide   

## 2019-04-30 ENCOUNTER — Telehealth: Payer: Self-pay | Admitting: *Deleted

## 2019-04-30 LAB — BASIC METABOLIC PANEL
BUN/Creatinine Ratio: 13 (ref 12–28)
BUN: 9 mg/dL (ref 8–27)
CO2: 28 mmol/L (ref 20–29)
Calcium: 9.2 mg/dL (ref 8.7–10.3)
Chloride: 102 mmol/L (ref 96–106)
Creatinine, Ser: 0.67 mg/dL (ref 0.57–1.00)
GFR calc Af Amer: 109 mL/min/{1.73_m2} (ref >59)
GFR calc non Af Amer: 95 mL/min/{1.73_m2} (ref >59)
Glucose: 118 mg/dL — ABNORMAL HIGH (ref 65–99)
Potassium: 4.4 mmol/L (ref 3.5–5.2)
Sodium: 146 mmol/L — ABNORMAL HIGH (ref 134–144)

## 2019-04-30 NOTE — Telephone Encounter (Signed)
-----   Message from Suzzanne Cloud, NP sent at 04/30/2019  7:50 AM EDT ----- Please call the patient. Labs look okay. Glucose mildly elevated 118, sodium mildly elevated, stay hydrated with water. Kidney function looks good.

## 2019-04-30 NOTE — Telephone Encounter (Signed)
LMVM with her lab results on her mobile #.  She is to call back if questions.

## 2019-05-05 ENCOUNTER — Other Ambulatory Visit: Payer: Self-pay | Admitting: Neurology

## 2019-05-25 ENCOUNTER — Telehealth: Payer: Self-pay | Admitting: Neurology

## 2019-05-25 NOTE — Telephone Encounter (Signed)
Pt is asking for a call from RN to discuss her script for levETIRAcetam (KEPPRA) 250 MG tablet/  Pt states she has been without this medication for 3 weeks.  Please call

## 2019-05-25 NOTE — Telephone Encounter (Signed)
I called her back.  LMVM for her that prescription was escribed to [facility], 04-29-19 #90 with 4 refills.  Please return call to discuss.

## 2019-05-26 NOTE — Telephone Encounter (Signed)
LMVM for pt (2nd call) that prescription was escribed for her [facility] 04-29-19.  She is to call back if questions.

## 2019-05-26 NOTE — Telephone Encounter (Signed)
Spoke to pt and she relayed that due to increase of keppra, insurance needing PA. I have not received request on this.  I will go and try to get PA.  Pt was ok if this not approved would still like to continue using BID.  [place] TRACKS form completed, to Dr. Jannifer Franklin for signature.

## 2019-05-26 NOTE — Telephone Encounter (Signed)
The form has been signed.

## 2019-05-30 ENCOUNTER — Other Ambulatory Visit: Payer: Self-pay

## 2019-05-30 ENCOUNTER — Ambulatory Visit
Admission: RE | Admit: 2019-05-30 | Discharge: 2019-05-30 | Disposition: A | Discharge status: Home / Self Care | Payer: Medicare Other | Source: Ambulatory Visit | Attending: Neurology | Admitting: Neurology

## 2019-05-30 DIAGNOSIS — M5410 Radiculopathy, site unspecified: Secondary | ICD-10-CM | POA: Diagnosis not present

## 2019-05-30 MED ORDER — GADOBENATE DIMEGLUMINE 529 MG/ML IV SOLN
20.0000 mL | Freq: Once | INTRAVENOUS | Status: AC | PRN
  Administered 2019-05-30: 20 mL via INTRAVENOUS

## 2019-06-01 ENCOUNTER — Telehealth: Payer: Self-pay | Admitting: Neurology

## 2019-06-01 NOTE — Telephone Encounter (Signed)
I called the patient.  The MRI of the thoracic spine shows small disc protrusions at the T7/8 and at T11/12 levels.  No evidence of spinal cord or nerve root compression, no indication for surgery.  It is not clear that this is the etiology of her abdominal discomfort.    MRI thoracic 05/29/19:  IMPRESSION: This MRI of the thoracic spine with and without contrast shows the following: 1.   The spinal cord appears normal. 2.   There is a small right paramedian disc herniation at T7-T8 and a right paramedian disc protrusion at T11-T12.  There does not appear to be spinal stenosis or nerve root compression. 3.   There is a normal enhancement pattern.

## 2019-06-03 ENCOUNTER — Other Ambulatory Visit: Payer: Self-pay | Admitting: Neurology

## 2019-06-03 MED ORDER — LEVETIRACETAM 250 MG PO TABS: 250.0000 mg | ORAL_TABLET | Freq: Two times a day (BID) | ORAL | 11 refills | Status: DC

## 2019-06-03 NOTE — Telephone Encounter (Signed)
Pt called wanting to speak to the RN about her Keppra and also about her MRI. Please advise.

## 2019-06-03 NOTE — Telephone Encounter (Signed)
I called and spoke to pt.  She does not have access to smartphone to get goodrx app, or computer (or other family members to get it).  I relayed could print and fax in, she states she does not have any of the pharmacy's available near her to get it.  She would like to continue on the $3.00 month for 250mg  po bid if ok with SS/NP.

## 2019-06-03 NOTE — Telephone Encounter (Signed)
Is ok to go back to Keppra 250 mg po bid. May consider Good Rx in the future.

## 2019-06-03 NOTE — Telephone Encounter (Signed)
Followed up on [place] tracks and no records found.  I called this am, and medicaid will not fill, if pt is mcr eligible.  I called her pharmacy she has [facility] PO:[phone] BIN [id number] [phone].  [facility] KEY [id number].  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  This is for Levetiracetam 250mg  am and 500mg  pm. Did you want to go back to 250mg  po bid.

## 2019-06-04 NOTE — Telephone Encounter (Signed)
I called pt and relayed that the keppra was called in yesterday at the 250mg  po bid.  She wanted more clarification on the MRI results of thoracic spine.  I read Dr. Jannifer Franklin' note to her and she was appreciative and that now will f/u with vascular surgeon.   She will call back as needed.

## 2019-06-16 ENCOUNTER — Ambulatory Visit (INDEPENDENT_AMBULATORY_CARE_PROVIDER_SITE_OTHER): Payer: Medicare Other | Admitting: Internal Medicine

## 2019-06-17 ENCOUNTER — Telehealth (HOSPITAL_COMMUNITY): Payer: Self-pay

## 2019-06-17 ENCOUNTER — Other Ambulatory Visit: Payer: Self-pay

## 2019-06-17 DIAGNOSIS — I83813 Varicose veins of bilateral lower extremities with pain: Secondary | ICD-10-CM

## 2019-06-17 NOTE — Telephone Encounter (Signed)
Voicemail was left for patient including appointment time and information and instructions regarding COVID-19 safety procedures including mask usage, rescheduling if sympotmatic, and limited visitors in testing area.  

## 2019-06-18 ENCOUNTER — Ambulatory Visit (HOSPITAL_COMMUNITY)
Admission: RE | Admit: 2019-06-18 | Discharge: 2019-06-18 | Disposition: A | Discharge status: Home / Self Care | Payer: Medicare Other | Source: Ambulatory Visit | Attending: Vascular Surgery | Admitting: Vascular Surgery

## 2019-06-18 ENCOUNTER — Ambulatory Visit (INDEPENDENT_AMBULATORY_CARE_PROVIDER_SITE_OTHER): Payer: Medicare Other | Admitting: Vascular Surgery

## 2019-06-18 ENCOUNTER — Encounter: Payer: Self-pay | Admitting: Vascular Surgery

## 2019-06-18 ENCOUNTER — Other Ambulatory Visit: Payer: Self-pay

## 2019-06-18 VITALS — BP 152/90 | HR 88 | Temp 99.1°F | Resp 18 | Ht 68.5 in | Wt 228.8 lb

## 2019-06-18 DIAGNOSIS — I872 Venous insufficiency (chronic) (peripheral): Secondary | ICD-10-CM

## 2019-06-18 DIAGNOSIS — I83813 Varicose veins of bilateral lower extremities with pain: Secondary | ICD-10-CM | POA: Diagnosis not present

## 2019-06-18 NOTE — Progress Notes (Signed)
REASON FOR CONSULT:    Painful varicose veins.  The consult is requested by Chapman Fitch.   ASSESSMENT & PLAN:   CHRONIC VENOUS INSUFFICIENCY: This patient has deep venous reflux on the left involving the common femoral vein and also significant superficial venous reflux involving the great saphenous vein.  She describes symptoms consistent with venous hypertension including venous claudication on the left.  We have discussed conservative measures including the importance of intermittent leg elevation and the proper positioning for this.  I have written her a prescription for thigh-high compression stockings with a gradient of 20 to 30 mmHg.  I have encouraged her to avoid prolonged sitting and standing.  We have discussed the importance of exercise specifically walking and water aerobics.  We have also discussed the importance of weight management as central obesity especially increases lower extremity venous pressure.  I will plan on seeing her back in 3 months.  If her symptoms have not improved significantly, I think she would be a good candidate for laser ablation of the left great saphenous vein to the mid thigh.  She would also require 10-20 stab phlebectomies.  We could also evaluate the posterior medial branch that is incompetent on the left.  SCREENING ABDOMINAL AORTIC ANEURYSM: Given that her father died from a ruptured aneurysm and given her age I have recommended that she obtain a baseline duplex of her abdominal aorta.  This will be scheduled when she returns in 3 months.  TOBACCO CESSATION: I have discussed with her the importance of tobacco cessation.  Deitra Mayo, MD, FACS Beeper [phone] Office: [phone]   HPI:   Teresa Benitez is a pleasant 64 y.o. female, 6 referred for evaluation of chronic venous insufficiency.  I reviewed the records from the referring office.  The patient has varicose veins of both lower extremities with significant pain.  The patient has  had issues with varicose veins since she was 63 years old.  However these were not especially symptomatic.  However over the last 2 months she has developed significant pain in her left thigh and left leg.  She describes aching throbbing pain in the left leg which is aggravated by standing and relieved with elevation.  In addition, she states that walking increases her aching throbbing pain in the left leg.  She is unaware of any previous history of DVT or phlebitis.  She has not been wearing compression stockings.  She does take ibuprofen as needed for pain.  She elevates her legs which does help.  Of note, she states that her father died from a ruptured abdominal aortic aneurysm.  Past Medical History:  Diagnosis Date  . Carpal tunnel syndrome, bilateral 08/26/2018  . Chest pain    negative myoview 02/14/2015  . Chronic constipation   . Cirrhosis ([place])   . DDD (degenerative disc disease), lumbar   . Depression   . Fibromyalgia   . Hepatitis C    treated, none since 2016  . Hepatitis C approx. 2004  . History of kidney stones   . Hypertension   . Hypothyroidism   . Liver disease   . Peripheral neuropathy 08/26/2018  . Spleen enlarged   . Thrombocytopenia ([place])   . Vision abnormalities     Family History  Problem Relation Age of Onset  . Healthy Mother   . Diabetes Father   . Heart disease Father   . Hypertension Father   . Healthy Brother   . Healthy Brother   . Healthy Daughter   .  Healthy Son     SOCIAL HISTORY: Social History   Socioeconomic History  . Marital status: Divorced    Spouse name: Not on file  . Number of children: Not on file  . Years of education: Not on file  . Highest education level: Not on file  Occupational History  . Not on file  Social Needs  . Financial resource strain: Not on file  . Food insecurity    Worry: Not on file    Inability: Not on file  . Transportation needs    Medical: Not on file    Non-medical: Not on file  Tobacco Use   . Smoking status: Current Every Day Smoker    Packs/day: 1.00    Years: 44.00    Pack years: 44.00    Types: Cigarettes  . Smokeless tobacco: Never Used  . Tobacco comment: Patient states that she smokes 2 pack a day , has smoked since she was 89.  Substance and Sexual Activity  . Alcohol use: No  . Drug use: No  . Sexual activity: Not on file  Lifestyle  . Physical activity    Days per week: Not on file    Minutes per session: Not on file  . Stress: Not on file  Relationships  . Social Herbalist on phone: Not on file    Gets together: Not on file    Attends religious service: Not on file    Active member of club or organization: Not on file    Attends meetings of clubs or organizations: Not on file    Relationship status: Not on file  . Intimate partner violence    Fear of current or ex partner: Not on file    Emotionally abused: Not on file    Physically abused: Not on file    Forced sexual activity: Not on file  Other Topics Concern  . Not on file  Social History Narrative  . Not on file    Allergies  Allergen Reactions  . Ampicillin Hives, Shortness Of Breath and Swelling    Lips swelling Has patient had a PCN reaction causing immediate rash, facial/tongue/throat swelling, SOB or lightheadedness with hypotension: Yes Has patient had a PCN reaction causing severe rash involving mucus membranes or skin necrosis: No Has patient had a PCN reaction that required hospitalization: No Has patient had a PCN reaction occurring within the last 10 years: Yes If all of the above answers are "NO", then may proceed with Cephalosporin use.   . Carbamazepine     Nausea, headache  . Ciprofloxacin Hives  . Dilantin [Phenytoin Sodium Extended]     nausea  . Sulfamethoxazole Hives    Current Outpatient Medications  Medication Sig Dispense Refill  . alum & mag hydroxide-simeth (MAALOX/MYLANTA) 200-200-20 MG/5ML suspension Take 15 mLs by mouth every 6 (six) hours as  needed for indigestion or heartburn.    Marland Kitchen amLODipine (NORVASC) 5 MG tablet Take 5 mg by mouth daily.    Marland Kitchen BELBUCA 150 MCG FILM PLACE 1 FILM ON TONGUE TWO TIMES DAILY  0  . budesonide-formoterol (SYMBICORT) 160-4.5 MCG/ACT inhaler Inhale 1 puff into the lungs daily as needed (FOR SHORTNESS OF BREATH).     . Cholecalciferol (VITAMIN D PO) Take 1,000 Units by mouth daily.    Marland Kitchen dicyclomine (BENTYL) 10 MG capsule TAKE 1 CAPSULE BY MOUTH THREE TIMES DAILY AS NEEDED FOR  SPASMS 90 capsule 2  . gabapentin (NEURONTIN) 300 MG capsule Take 1,200  mg by mouth 3 (three) times daily.     Marland Kitchen levETIRAcetam (KEPPRA) 250 MG tablet Take 1 tablet (250 mg total) by mouth 2 (two) times daily. 60 tablet 11  . levothyroxine (SYNTHROID, LEVOTHROID) 75 MCG tablet Take 75 mcg by mouth daily before breakfast.    . lisinopril (PRINIVIL,ZESTRIL) 30 MG tablet Take 40 mg by mouth daily.     . methocarbamol (ROBAXIN) 500 MG tablet Take 500-750 mg by mouth 2 (two) times daily as needed for muscle spasms (takes 1 tablet in the morning and 1.5 tablet at bedtime). Takes a dose every morning and another dose at bedtime only if needed for pain    . metoCLOPramide (REGLAN) 5 MG tablet Take 1 tablet (5 mg total) by mouth every 6 (six) hours as needed for nausea.    . mirtazapine (REMERON) 15 MG tablet TAKE 1 TABLET BY MOUTH EVERY DAY AT NIGHT  2  . Multiple Vitamin (MULTI-VITAMINS) TABS Take 1 tablet by mouth daily.    . naproxen sodium (ANAPROX) 220 MG tablet Take 440 mg by mouth daily as needed (pain).    . ondansetron (ZOFRAN) 4 MG tablet Take 1 tablet (4 mg total) by mouth every 8 (eight) hours as needed for nausea or vomiting. Patient takes in the morning. 30 tablet 2  . oxyCODONE (OXY IR/ROXICODONE) 5 MG immediate release tablet Take 5-15 mg by mouth every 8 (eight) hours as needed for moderate pain or severe pain (depends on pain level if takes 1-3 tablets).     . promethazine (PHENERGAN) 25 MG tablet Take 25 mg by mouth every 8  (eight) hours as needed for nausea or vomiting (only takes when zofran does not work).     . sertraline (ZOLOFT) 100 MG tablet Take 100 mg by mouth daily.     . vitamin B-12 (CYANOCOBALAMIN) 500 MCG tablet Take 1,000 mcg by mouth daily.      No current facility-administered medications for this visit.     REVIEW OF SYSTEMS:  [X]  denotes positive finding, [ ]  denotes negative finding Cardiac  Comments:  Chest pain or chest pressure:    Shortness of breath upon exertion:    Short of breath when lying flat:    Irregular heart rhythm:        Vascular    Pain in calf, thigh, or hip brought on by ambulation: x   Pain in feet at night that wakes you up from your sleep:     Blood clot in your veins:    Leg swelling:  x       Pulmonary    Oxygen at home:    Productive cough:     Wheezing:         Neurologic    Sudden weakness in arms or legs:     Sudden numbness in arms or legs:     Sudden onset of difficulty speaking or slurred speech:    Temporary loss of vision in one eye:     Problems with dizziness:         Gastrointestinal    Blood in stool:     Vomited blood:         Genitourinary    Burning when urinating:     Blood in urine:        Psychiatric    Major depression:         Hematologic    Bleeding problems:    Problems with blood clotting too easily:  Skin    Rashes or ulcers:        Constitutional    Fever or chills:     PHYSICAL EXAM:   Vitals:   06/18/19 1450  BP: (!) 152/90  Pulse: 88  Resp: 18  Temp: 99.1 F (37.3 C)  TempSrc: Temporal  SpO2: 95%  Weight: 228 lb 12.8 oz (103.8 kg)  Height: 5' 8.5" (1.74 m)    GENERAL: The patient is a well-nourished female, in no acute distress. The vital signs are documented above. CARDIAC: There is a regular rate and rhythm.  VASCULAR: I do not detect carotid bruits. She has palpable posterior tibial pulses bilaterally. She has some varicose veins along the medial aspect of her left thigh and left  calf as documented below.   I did look at her saphenous vein myself with the SonoSite and there is reflux from the saphenofemoral junction to the mid thigh.  The vein here is dilated but then becomes smaller below that.  I think that the vein could be cannulated in the mid thigh. PULMONARY: There is good air exchange bilaterally  without wheezing or rales. ABDOMEN: Soft and non-tender with normal pitched bowel sounds.  MUSCULOSKELETAL: There are no major deformities or cyanosis. NEUROLOGIC: No focal weakness or paresthesias are detected. SKIN: There are no ulcers or rashes noted. PSYCHIATRIC: The patient has a normal affect.  DATA:    VENOUS DUPLEX: I have independently interpreted her venous duplex scan today.  On the right side there is no evidence of DVT.  There is deep venous reflux involving the common femoral vein.  There is reflux at the saphenofemoral junction on the right but no other significant superficial venous reflux.   On the left side there is no evidence of DVT.  There is deep venous reflux involving the common femoral vein.  There is superficial venous reflux involving the great saphenous vein from the saphenofemoral junction to the distal thigh.  The vein is significantly dilated with diameters up to 0.57 cm.  It is also noted that she had an incompetent posterior medial vein in the left thigh.  A total of 60 minutes was spent on this visit. 30 minutes was face to face time. More than 50% of the time was spent on counseling and coordinating with the patient.

## 2019-06-23 ENCOUNTER — Encounter: Payer: Self-pay | Admitting: Vascular Surgery

## 2019-07-10 ENCOUNTER — Encounter: Payer: Self-pay | Admitting: Internal Medicine

## 2019-09-02 ENCOUNTER — Other Ambulatory Visit (INDEPENDENT_AMBULATORY_CARE_PROVIDER_SITE_OTHER): Payer: Self-pay | Admitting: Internal Medicine

## 2019-09-02 DIAGNOSIS — R109 Unspecified abdominal pain: Secondary | ICD-10-CM

## 2019-09-11 ENCOUNTER — Encounter: Payer: Self-pay | Admitting: Internal Medicine

## 2019-09-11 ENCOUNTER — Ambulatory Visit (INDEPENDENT_AMBULATORY_CARE_PROVIDER_SITE_OTHER): Payer: Medicare Other | Admitting: Internal Medicine

## 2019-09-11 ENCOUNTER — Other Ambulatory Visit: Payer: Self-pay

## 2019-09-11 VITALS — BP 128/66 | HR 91 | Temp 97.0°F | Ht 70.0 in | Wt 233.4 lb

## 2019-09-11 DIAGNOSIS — Z716 Tobacco abuse counseling: Secondary | ICD-10-CM

## 2019-09-11 DIAGNOSIS — R918 Other nonspecific abnormal finding of lung field: Secondary | ICD-10-CM | POA: Diagnosis not present

## 2019-09-11 DIAGNOSIS — F17210 Nicotine dependence, cigarettes, uncomplicated: Secondary | ICD-10-CM

## 2019-09-11 DIAGNOSIS — F17200 Nicotine dependence, unspecified, uncomplicated: Secondary | ICD-10-CM

## 2019-09-11 DIAGNOSIS — J432 Centrilobular emphysema: Secondary | ICD-10-CM | POA: Diagnosis not present

## 2019-09-11 MED ORDER — ANORO ELLIPTA 62.5-25 MCG/INH IN AEPB: 1.0000 | INHALATION_SPRAY | Freq: Every day | RESPIRATORY_TRACT | 11 refills | Status: DC

## 2019-09-11 MED ORDER — BUPROPION HCL ER (SMOKING DET) 150 MG PO TB12: 150.0000 mg | ORAL_TABLET | Freq: Two times a day (BID) | ORAL | 3 refills | Status: DC

## 2019-09-11 NOTE — Progress Notes (Addendum)
Teresa Benitez    CN:[id number]    [birth year]  Primary Care Physician:Barrino, Nicole Kindred, MD  Referring Physician: .Sandi Mealy, MD Reason for Consultation: lung nodule Date of Consultation: 09/11/2019  Chief complaint:   Chief Complaint  Patient presents with  . Consult    referred by PCP for lung nodules found on 08/2019 CT chest.       HPI: Ms. Teresa Benitez is a 63 year old woman with a 40+ pack year smoking history and a diagnosis of hepatitis C cirrhosis.  She was undergoing work-up for hepatitis C and was found on a CT abdomen pelvis to have pulmonary nodules which were confirmed on CT chest.  Unfortunately I do not have these imaging discs available for review but they did send the reports.  Notes progressive dyspnea on exertion for the last 6 months and takes a Ventolin as needed now up to twice a day.  She has been on Symbicort for many years.  She is a current everyday smoker but has cut down to 0.5 packs/day.  No hospitalizations or ED visits.  She has not had any recent courses of prednisone.  She has significant abdominal pain and tenderness and active symptoms related to this her cirrhosis.  She used to be a much heavier smoker 1 pack/day, but has now cut down to 0.5 packs/day.  She does have depression and had tried Chantix in the past but had vivid dreams and did not tolerate.  He is very motivated to quit smoking.  Social history:  Occupation: disabled, worked previously as a Investment banker, operational.  Lives at home with her daughter/fiance/grandson Smoking history: current every day smoker over 40 pack years  Social History   Occupational History  . Not on file  Tobacco Use  . Smoking status: Current Every Day Smoker    Packs/day: 1.00    Years: 44.00    Pack years: 44.00    Types: Cigarettes  . Smokeless tobacco: Never Used  . Tobacco comment: down to 0.5ppd  Substance and Sexual Activity  . Alcohol use: No  . Drug use: No  . Sexual activity: Not on  file    Relevant family history:  Family History  Problem Relation Age of Onset  . Healthy Mother   . Diabetes Father   . Heart disease Father   . Hypertension Father   . Healthy Brother   . Healthy Brother   . Healthy Daughter   . Healthy Son   . Lung cancer Maternal Uncle     Past Medical History:  Diagnosis Date  . Carpal tunnel syndrome, bilateral 08/26/2018  . Chest pain    negative myoview 02/14/2015  . Chronic constipation   . Cirrhosis ([place])   . DDD (degenerative disc disease), lumbar   . Depression   . Fibromyalgia   . Hepatitis C    treated, none since 2016  . Hepatitis C approx. 2004  . History of kidney stones   . Hypertension   . Hypothyroidism   . Liver disease   . Peripheral neuropathy 08/26/2018  . Spleen enlarged   . Thrombocytopenia ([place])   . Vision abnormalities     Past Surgical History:  Procedure Laterality Date  . CARDIAC CATHETERIZATION     with stent  . CESAREAN SECTION  1996  . COLONOSCOPY  5-6 years ago    Done In [place]  . ESOPHAGOGASTRODUODENOSCOPY (EGD) WITH PROPOFOL N/A 08/23/2017   Procedure: ESOPHAGOGASTRODUODENOSCOPY (EGD) WITH PROPOFOL;  Surgeon: Rogene Houston, MD;  Location: [facility];  Service: Endoscopy;  Laterality: N/A;  . Para Thyhoid  2005  . PARTIAL HYSTERECTOMY     Patient states that she had 16 years ago?1998  . UPPER GASTROINTESTINAL ENDOSCOPY       Review of systems: Review of Systems  Constitutional: Negative for chills, fever and weight loss.  HENT: Positive for congestion. Negative for sinus pain and sore throat.   Eyes: Negative for discharge and redness.  Respiratory: Positive for shortness of breath. Negative for cough, hemoptysis, sputum production and wheezing.   Cardiovascular: Negative for chest pain, palpitations and leg swelling.  Gastrointestinal: Positive for abdominal pain. Negative for blood in stool, heartburn, nausea and vomiting.  Musculoskeletal: Positive for joint pain and  myalgias.  Skin: Negative for rash.  Neurological: Negative for dizziness, tremors, focal weakness and headaches.  Endo/Heme/Allergies: Negative for environmental allergies. Bruises/bleeds easily.  Psychiatric/Behavioral: Negative for depression. The patient is not nervous/anxious.   All other systems reviewed and are negative.   Physical Exam: Blood pressure 128/66, pulse 91, temperature (!) 97 F (36.1 C), temperature source Temporal, height 5\' 10"  (1.778 m), weight 233 lb 6.4 oz (105.9 kg), SpO2 96 %. Gen:      No acute distress Eyes: EOMI, sclera anicteric ENT:  no nasal polyps, mucus membranes moist Neck:     Supple, no thyromegaly Lungs:    No increased respiratory effort, symmetric chest wall excursion, clear to auscultation bilaterally, no wheezes or crackles CV:         Regular rate and rhythm; no murmurs, rubs, or gallops.  No pedal edema Abd:      Abdomen is distended, there is splenomegaly and she is diffusely tender in the left upper quadrant.  + bowel sounds MSK: no acute synovitis of DIP or PIP joints, no mechanics hands.  Skin:      Multiple areas of bruising.  Neuro: normal speech, no focal facial asymmetry Psych: alert and oriented x3, normal mood and affect  Data Reviewed: Imaging: I have personally reviewed the imaging. She has a GGO in the RUL which is about 0.8cm in size with right hilar lymph node enlarged to 1.2cm. otherwise no masses or bulky lymphadenopathy.   PFTs: None on file  Labs:  Immunization status: Immunization History  Administered Date(s) Administered  . Influenza,inj,quad, With Preservative 06/18/2019  . Pneumococcal Polysaccharide-23 10/01/2018    Assessment:  Solitary Pulmonary Nodule - RUL ggo 0.8 mm in size Hepatitis C cirrhosis Encounter for smoking cessation  Plan/Recommendations: I personally spent 9 minutes counseling the patient regarding tobacco use disorder.  Patient is symptomatic from tobacco use disorder due to the  following condition: Emphysema.  The patient's response was /contemplative.  We discussed nicotine replacement therapy, Wellbutrin, Chantix.  We identified to gather patient specific barriers to change.  The patient is open to future discussions about tobacco cessation.  Today she would like to try Wellbutrin for smoking cessation.  We discussed cough medication is used, and side effects including dry mouth, mood disorder, vivid dreams.  She will follow up in 4 weeks for this.  She is also on Zoloft for her mood and feels that her depression could use better control so it seems reasonable to try another antidepressant for smoking cessation.  Repeat CT in 3 months - from 11/20 date based on Fleischner criteria below We will get a full set of PFTs She has worsening dyspnea, in addition to smoking cessation we will add Anoro  and stop Symbicort.  Fleischner Society Guidelines 2017 MacMahon H, Naidich DP, Goo JM, et al. Guidelines for management of incidental pulmonary nodules detected on CT Images: From the Fleischner Society. Radiology 2017; [id number]. Copyright  2017 Radiological Society of [place].  Evaluation of the incidental solid pulmonary nodule in adults Nodule size (mm) Low (<5%) cancer risk High (>65%) or moderate (5 to 65%) cancer risk  Solitary  <6 No routine follow-up Optional CT at 12 months  6 to 8 CT at 6 to 12 months, then consider CT at 18 to 24 months CT at 6 to 12 months, then CT at 18 to 24 months  >8 CT at 3 months, then at 9 and 24 months FDG PET/CT, biopsy or resection  Multiple (evaluation based on largest nodule)  <6 No routine follow-up Optional CT at 12 months  ?6 CT at 3 to 6 months, then consider CT at 18 to 24 months CT at 3 to 6 months, then CT at 18 to 24 months    Not applicable to patients age <35 years, in lung cancer screening, with immunosuppression, known pulmonary disease or symptoms or active primary cancer.  Chest CT performed without contrast as  contiguous 1 mm sections using low dose technique.  Growing or FDG-avid nodules should undergo biopsy or resection. Growth is defined as >1.5 mm increase.  Nodules unchanged for >2 years are benign.   CT: computed tomography; FDG: 18-fluorodeoxyglucose; PET: positron emission tomography.  Return to Care: Return in about 4 weeks (around 10/09/2019).  This can be a virtual visit.  Lenice Llamas, MD Pulmonary and [place]  CC: No ref. provider found

## 2019-09-11 NOTE — Patient Instructions (Addendum)
The patient should have follow up scheduled with myself in 5 months.   Prior to next visit patient should have a full set of PFTs including: (ASAP)  Spirometry with bronchodilator if obstructed Lung Volumes DLCO  Bupropion -- Bupropion (brand names: Zyban, Wellbutrin) is an antidepressant that can be used to help you stop smoking.  Start taking it once per day for three days, then increase to twice daily starting four weeks before the quit date.  You should typically continue for 7 to 12 weeks after you quit smoking.  Please do not stop taking this medication abruptly.  When you are ready to stop we will have you go to once daily for two weeks and then you can do once every other day for a week before you stop.   Bupropion is generally well-tolerated, but it may cause dry mouth and difficulty sleeping. The drug should not be used by people who have a seizure disorder or bipolar (manic-depressive) disorder, and it is not recommended for those who have head trauma, anorexia nervosa, or bulimia, or for those who drink alcohol excessively.  What are the benefits of quitting smoking? Quitting smoking can lower your chances of getting or dying from heart disease, lung disease, kidney failure, infection, or cancer. It can also lower your chances of getting osteoporosis, a condition that makes your bones weak. Plus, quitting smoking can help your skin look younger and reduce the chances that you will have problems with sex.  Quitting smoking will improve your health no matter how old you are, and no matter how long or how much you have smoked.  What should I do if I want to quit smoking? The letters in the word "START" can help you remember the steps to take: S = Set a quit date. T = Tell family, friends, and the people around you that you plan to quit. A = Anticipate or plan ahead for the tough times you'll face while quitting. R = Remove cigarettes and other tobacco products from your home, car, and  work. T = Talk to your doctor about getting help to quit.  How can my doctor or nurse help? Your doctor or nurse can give you advice on the best way to quit. He or she can also put you in touch with counselors or other people you can call for support. Plus, your doctor or nurse can give you medicines to: ?Reduce your craving for cigarettes ?Reduce the unpleasant symptoms that happen when you stop smoking (called "withdrawal symptoms"). You can also get help from a free phone line (1-800-QUIT-NOW) or go online to [employer].  What are the symptoms of withdrawal? The symptoms include: ?Trouble sleeping ?Being irritable, anxious or restless ?Getting frustrated or angry ?Having trouble thinking clearly  Some people who stop smoking become temporarily depressed. Some people need treatment for depression, such as counseling or antidepressant medicines. Depressed people might: ?No longer enjoy or care about doing the things they used to like to do ?Feel sad, down, hopeless, nervous, or cranky most of the day, almost every day ?Lose or gain weight ?Sleep too much or too little ?Feel tired or like they have no energy ?Feel guilty or like they are worth nothing ?Forget things or feel confused ?Move and speak more slowly than usual ?Act restless or have trouble staying still ?Think about death or suicide  If you think you might be depressed, see your doctor or nurse. Only someone trained in mental health can tell for sure if  you are depressed. If you ever feel like you might hurt yourself, go straight to the nearest emergency department. Or you can call for an ambulance (in the [place] and [place], [place] 9-1-1) or call your doctor or nurse right away and tell them it is an emergency. You can also reach the [place] National Suicide Prevention Lifeline at (725)083-6412 or http://walker-sanchez.info/.  How do medicines help you stop smoking? Different medicines work in different ways: ?Nicotine  replacement therapy eases withdrawal and reduces your body's craving for nicotine, the main drug found in cigarettes. There are different forms of nicotine replacement, including skin patches, lozenges, gum, nasal sprays, and "puffers" or inhalers. Many can be bought without a prescription, while others might require one. ?Bupropion is a prescription medicine that reduces your desire to smoke. This medicine is sold under the brand names Zyban and Wellbutrin. It is also available in a generic version, which is cheaper than brand name medicines. ?Varenicline (brand names: Chantix, Champix) is a prescription medicine that reduces withdrawal symptoms and cigarette cravings. If you think you'd like to take varenicline and you have a history of depression, anxiety, or heart disease, discuss this with your doctor or nurse before taking the medicine. Varenicline can also increase the effects of alcohol in some people. It's a good idea to limit drinking while you're taking it, at least until you know how it affects you.  How does counseling work? Counseling can happen during formal office visits or just over the phone. A counselor can help you: ?Figure out what triggers your smoking and what to do instead ?Overcome cravings ?Figure out what went wrong when you tried to quit before  What works best? Studies show that people have the best luck at quitting if they take medicines to help them quit and work with a Social worker. It might also be helpful to combine nicotine replacement with one of the prescription medicines that help people quit. In some cases, it might even make sense to take bupropion and varenicline together.  What about e-cigarettes? Sometimes people wonder if using electronic cigarettes, or "e-cigarettes," might help them quit smoking. Using e-cigarettes is also called "vaping." Doctors do not recommend e-cigarettes in place of medicines and counseling. That's because e-cigarettes still contain  nicotine as well as other substances that might be harmful. It's not clear how they can affect a person's health in the long term.  Will I gain weight if I quit? Yes, you might gain a few pounds. But quitting smoking will have a much more positive effect on your health than weighing a few pounds more. Plus, you can help prevent some weight gain by being more active and eating less. Taking the medicine bupropion might help control weight gain.   What else can I do to improve my chances of quitting? You can: ?Start exercising. ?Stay away from smokers and places that you associate with smoking. If people close to you smoke, ask them to quit with you. ?Keep gum, hard candy, or something to put in your mouth handy. If you get a craving for a cigarette, try one of these instead. ?Don't give up, even if you start smoking again. It takes most people a few tries before they succeed.  What if I am pregnant and I smoke? If you are pregnant, it's really important for the health of your baby that you quit. Ask your doctor what options you have, and what is safest for your baby

## 2019-09-15 ENCOUNTER — Other Ambulatory Visit: Payer: Self-pay

## 2019-09-15 ENCOUNTER — Encounter (INDEPENDENT_AMBULATORY_CARE_PROVIDER_SITE_OTHER): Payer: Self-pay | Admitting: Internal Medicine

## 2019-09-15 ENCOUNTER — Ambulatory Visit (INDEPENDENT_AMBULATORY_CARE_PROVIDER_SITE_OTHER): Payer: Medicare Other | Admitting: Internal Medicine

## 2019-09-15 VITALS — BP 157/90 | HR 92 | Temp 97.6°F | Ht 71.5 in | Wt 230.6 lb

## 2019-09-15 DIAGNOSIS — R1012 Left upper quadrant pain: Secondary | ICD-10-CM

## 2019-09-15 DIAGNOSIS — G8929 Other chronic pain: Secondary | ICD-10-CM

## 2019-09-15 DIAGNOSIS — K7469 Other cirrhosis of liver: Secondary | ICD-10-CM

## 2019-09-15 DIAGNOSIS — K589 Irritable bowel syndrome without diarrhea: Secondary | ICD-10-CM

## 2019-09-15 NOTE — Progress Notes (Signed)
Presenting complaint;  Follow-up for cirrhosis and chronic left upper quadrant abdominal pain.  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  Her last ultrasound was in September 2019.  She was not able to have ultrasound earlier this year because of Covid-19 pandemic.  She tells me that she had CT done to [facility] last month. She says her pain in left upper quadrant and left flank has gotten worse.  It starts on the side and radiates of the rib cage to epigastric region.  She feels this area is swollen.  She has had this pain for several years.  She says the pain has gotten worse.  She says nothing has ever helped.  Her pain medication that she takes her back does not touch this pain.  She has been to the pain clinic. She describes this pain to be throbbing and bad tooth ache.  She has peripheral neuropathy and under care of Dr. Elon Alas. Willis.  She states Keppra may have helped a little bit and her insurance would not allow higher dose.  Other things which provide some relief is heating pad and rest.  She says this pain is consuming her life. She says her bowels are moving much better and she is not even taking Linzess.  She says her nausea has improved. She has gained 6 pounds since her last visit of March 2020. She also complains of pain in left groin.  She says this pain is felt to be related to venous incompetence.  She is following up with vascular specialist.  Current Medications: Outpatient Encounter Medications as of 09/15/2019  Medication Sig  . alum & mag hydroxide-simeth (MAALOX/MYLANTA) 200-200-20 MG/5ML suspension Take 15 mLs by mouth every 6 (six) hours as needed for indigestion or heartburn.  Marland Kitchen amLODipine (NORVASC) 5 MG tablet Take 5 mg by mouth daily.  Marland Kitchen aspirin EC 81 MG tablet Take 81 mg by mouth daily.  Marland Kitchen atorvastatin (LIPITOR) 10 MG tablet Take 1 tablet by mouth daily.   Marland Kitchen BELBUCA 150 MCG FILM 450 mcg 2 (two) times daily.   Marland Kitchen buPROPion (ZYBAN) 150 MG 12 hr tablet Take 1 tablet (150 mg total) by mouth 2 (two) times daily.  . Cholecalciferol (VITAMIN D PO) Take 1,000 Units by mouth daily.  Marland Kitchen dicyclomine (BENTYL) 10 MG capsule Take 1 capsule (10 mg total) by mouth 3 (three) times daily as needed for spasms.  Marland Kitchen gabapentin (NEURONTIN) 300 MG capsule Take 1,200 mg by mouth 3 (three) times daily.   Marland Kitchen levETIRAcetam (KEPPRA) 250 MG tablet Take 1 tablet (250 mg total) by mouth 2 (two) times daily.  Marland Kitchen levothyroxine (SYNTHROID) 100 MCG tablet Take 100 mcg by mouth daily before breakfast.  . lisinopril (ZESTRIL) 40 MG tablet Take 40 mg by mouth daily.  . methocarbamol (ROBAXIN) 500 MG tablet Take 500-750 mg by mouth 2 (two) times daily as needed for muscle spasms (takes 1 tablet in the morning and 1.5 tablet at bedtime). Takes a dose every morning and another dose at bedtime only if needed for pain  . metoCLOPramide (REGLAN) 5 MG tablet Take 1 tablet (5 mg total) by mouth every 6 (six) hours as needed for nausea.  . mirtazapine (REMERON) 15 MG tablet TAKE 1 TABLET BY MOUTH EVERY DAY AT NIGHT  . Multiple Vitamin (MULTI-VITAMINS) TABS Take 1 tablet by mouth daily.  . naproxen sodium (ANAPROX) 220 MG tablet Take 440 mg  by mouth daily as needed (pain).  . ondansetron (ZOFRAN) 4 MG tablet Take 1 tablet (4 mg total) by mouth every 8 (eight) hours as needed for nausea or vomiting. Patient takes in the morning.  Marland Kitchen oxyCODONE (OXY IR/ROXICODONE) 5 MG immediate release tablet Take 10 mg by mouth every 8 (eight) hours as needed for moderate pain or severe pain (depends on pain level if takes 1-3 tablets).   . promethazine (PHENERGAN) 25 MG tablet Take 25 mg by mouth every 8 (eight) hours as needed for nausea or vomiting (only takes when zofran does not work).   . sertraline (ZOLOFT) 100 MG tablet Take 100 mg by mouth daily.   Marland Kitchen umeclidinium-vilanterol (ANORO ELLIPTA) 62.5-25 MCG/INH AEPB  Inhale 1 puff into the lungs daily.  . vitamin B-12 (CYANOCOBALAMIN) 500 MCG tablet Take 1,000 mcg by mouth daily.   . [DISCONTINUED] buPROPion (WELLBUTRIN XL) 150 MG 24 hr tablet Take 150 mg by mouth 2 (two) times daily.  . [DISCONTINUED] budesonide-formoterol (SYMBICORT) 160-4.5 MCG/ACT inhaler Inhale 2 puffs into the lungs 2 (two) times daily.    No facility-administered encounter medications on file as of 09/15/2019.    Objective: Blood pressure (!) 157/90, pulse 92, temperature 97.6 F (36.4 C), temperature source Oral, height 5' 11.5" (1.816 m), weight 230 lb 9.6 oz (104.6 kg). Patient is alert and in no acute distress. She is wearing facial mask. She does not have asterixis. Conjunctiva is pink. Sclera is nonicteric Oropharyngeal mucosa is normal. No neck masses or thyromegaly noted. Cardiac exam with regular rhythm normal S1 and S2. No murmur or gallop noted. Lungs are clear to auscultation. Abdomen is full.  On palpation abdomen is soft.  She has tenderness below the left costal margin on superficial palpation.  She also has increased tactile and pain sensation on the left side in the front and the back. No LE edema or clubbing noted.  Labs/studies Results: Abdominal pelvic CT with contrast on 08/19/2019 No focal abnormalities noted in the liver.  Lab data from 07/10/2019  WBC 5.8, H&H 12.9 and 41.8 Platelet count 112 K. Serum sodium 138, potassium 4.2, chloride 100, CO2 30 Glucose 159, BUN 10, creatinine 1.0 Bilirubin 0.4, AP 95, AST 29, ALT 16, total protein 7.0 and albumin 3.9 Serum calcium 9.3.  Alpha-fetoprotein was 3.9 on 08/04/2019.  Assessment:  #1.  Cirrhosis.  She has well compensated cirrhosis secondary to hepatitis C which was treated about 15 years ago.  Her other risk factor for chronic liver diseases diabetes.  Last EGD in November 2018 was negative for varices. No indication for EGD at this time.  #2.  Chronic left upper quadrant abdominal pain felt  to be neuropathic pain.  She has hyperalgesia.  She is very sensitive to tactile or pain sensation.  Lidoderm was prescribed previously but rejected by her insurance.  She may consider OTC Lidoderm skin patch.  It is very intriguing that she is on high-dose narcotic therapy and multiple antidepressants with these medications are not helping.  She needs to discuss other treatment options with her pain clinic physicians maybe she needs a nerve block.  Plan:  Patient will try OTC lidocaine patch which is 4%.  12 hours on 12 hours off. Next ultrasound and blood work would be in June 2020. Office visit in 1 year.

## 2019-09-15 NOTE — Patient Instructions (Signed)
Try over-the-counter lidocaine patch which is 4%.  12 hours on and 12 hours off. Please try this for 3 to 4 weeks and call us with progress report. Next ultrasound would be in June 2021.

## 2019-09-23 ENCOUNTER — Other Ambulatory Visit (INDEPENDENT_AMBULATORY_CARE_PROVIDER_SITE_OTHER): Payer: Self-pay | Admitting: Internal Medicine

## 2019-10-01 ENCOUNTER — Other Ambulatory Visit (HOSPITAL_COMMUNITY): Payer: Medicare Other

## 2019-10-01 ENCOUNTER — Ambulatory Visit: Payer: Medicare Other | Admitting: Vascular Surgery

## 2019-11-02 ENCOUNTER — Other Ambulatory Visit: Payer: Self-pay

## 2019-11-02 ENCOUNTER — Other Ambulatory Visit (HOSPITAL_COMMUNITY)
Admission: RE | Admit: 2019-11-02 | Discharge: 2019-11-02 | Disposition: A | Discharge status: Home / Self Care | Payer: Medicare Other | Source: Ambulatory Visit | Attending: Internal Medicine | Admitting: Internal Medicine

## 2019-11-02 DIAGNOSIS — Z20822 Contact with and (suspected) exposure to covid-19: Secondary | ICD-10-CM | POA: Insufficient documentation

## 2019-11-02 DIAGNOSIS — Z01812 Encounter for preprocedural laboratory examination: Secondary | ICD-10-CM | POA: Diagnosis present

## 2019-11-02 LAB — SARS CORONAVIRUS 2 (TAT 6-24 HRS): SARS Coronavirus 2: NEGATIVE

## 2019-11-03 ENCOUNTER — Ambulatory Visit: Payer: Medicare Other | Admitting: Neurology

## 2019-11-04 ENCOUNTER — Ambulatory Visit: Payer: Medicare Other | Admitting: Primary Care

## 2019-11-20 ENCOUNTER — Telehealth: Payer: Self-pay | Admitting: Internal Medicine

## 2019-11-20 DIAGNOSIS — J449 Chronic obstructive pulmonary disease, unspecified: Secondary | ICD-10-CM

## 2019-11-20 NOTE — Telephone Encounter (Addendum)
Called and spoke with the pt  She states needing PA for [employer] [facility] and spoke with pharmacist  Called [place] [facility] [phone] Pt ID [id number]   Pt has tried and failed symbicort   Was advised by [place] [facility] that we need to call her medicare part D plan  Called pt to get that phone number  Pt states number on her card is [phone]  Pt ID [id number]   Waited on hold for over 5 min with nobody coming to the phone [facility] 11/23/19

## 2019-11-23 MED ORDER — STIOLTO RESPIMAT 2.5-2.5 MCG/ACT IN AERS: 2.0000 | INHALATION_SPRAY | Freq: Every day | RESPIRATORY_TRACT | 5 refills | Status: DC

## 2019-11-23 NOTE — Telephone Encounter (Signed)
Called and spoke with Patient.  Dr Desai's recommendations given.  Understanding stated.  Nothing further at this time. 

## 2019-11-23 NOTE — Telephone Encounter (Signed)
Fine to stop symbicort as discussed at last visit and switch to stiolto.

## 2019-11-23 NOTE — Telephone Encounter (Signed)
Called [name] [facility] at [phone] and spoke with Burman Nieves regarding pt's [place] needing a PA, was advised that pt will need to try and fail one of the following: Stiolto or Spiriva handihaler OR try and fail two of the following: Atrovent, Combivent, Bevespi. Per pt's chart she has only been on Symbicort.   Dr. Shearon Stalls, please advise. Thanks.

## 2019-12-02 ENCOUNTER — Other Ambulatory Visit: Payer: Self-pay

## 2019-12-02 DIAGNOSIS — I714 Abdominal aortic aneurysm, without rupture, unspecified: Secondary | ICD-10-CM

## 2019-12-03 ENCOUNTER — Ambulatory Visit: Payer: Medicare Other | Admitting: Vascular Surgery

## 2019-12-03 ENCOUNTER — Inpatient Hospital Stay (HOSPITAL_COMMUNITY): Admission: RE | Admit: 2019-12-03 | Payer: Medicare Other | Source: Ambulatory Visit

## 2019-12-25 ENCOUNTER — Other Ambulatory Visit: Payer: Self-pay | Admitting: Internal Medicine

## 2019-12-29 ENCOUNTER — Ambulatory Visit: Payer: Medicare Other | Admitting: Neurology

## 2019-12-31 ENCOUNTER — Ambulatory Visit (INDEPENDENT_AMBULATORY_CARE_PROVIDER_SITE_OTHER): Payer: Medicare Other | Admitting: Vascular Surgery

## 2019-12-31 ENCOUNTER — Ambulatory Visit (HOSPITAL_COMMUNITY)
Admission: RE | Admit: 2019-12-31 | Discharge: 2019-12-31 | Disposition: A | Discharge status: Home / Self Care | Payer: Medicare Other | Source: Ambulatory Visit | Attending: Vascular Surgery | Admitting: Vascular Surgery

## 2019-12-31 ENCOUNTER — Other Ambulatory Visit: Payer: Self-pay

## 2019-12-31 ENCOUNTER — Encounter: Payer: Self-pay | Admitting: Vascular Surgery

## 2019-12-31 VITALS — BP 138/75 | HR 77 | Temp 97.2°F | Resp 16 | Ht 70.5 in | Wt 229.0 lb

## 2019-12-31 DIAGNOSIS — I872 Venous insufficiency (chronic) (peripheral): Secondary | ICD-10-CM | POA: Diagnosis not present

## 2019-12-31 DIAGNOSIS — I714 Abdominal aortic aneurysm, without rupture, unspecified: Secondary | ICD-10-CM

## 2019-12-31 DIAGNOSIS — I83813 Varicose veins of bilateral lower extremities with pain: Secondary | ICD-10-CM

## 2019-12-31 NOTE — Progress Notes (Signed)
Patient name: Teresa Benitez MRN: [id number] DOB: 07-05-1956 Sex: female  REASON FOR VISIT:   Follow-up of chronic venous insufficiency  HPI:   Teresa Benitez is a pleasant 64 y.o. female who I saw in consultation on 06/18/2019 with painful varicose veins of the left lower extremity.  She had varicose veins bilaterally but her symptoms on the left have become more pronounced.  She was having significant aching pain and throbbing in the left leg which was aggravated by standing and sitting and worse at the end of the day.  Her symptoms are relieved with elevation.  I recommended continued leg elevation, thigh-high compression stockings with a gradient of 20 to 30 mmHg, and ibuprofen as needed for pain.  I set her up for a 80-month follow-up visit.  In addition, her father died from a ruptured abdominal aortic aneurysm and I recommended screening for an abdominal aortic aneurysm.  Today the patient complains mostly of pain in the left groin which is aggravated by sitting.  The thigh-high stockings do help the symptoms in her lower leg but sometimes aggravate the pain in her groin.  Her symptoms are alleviated by leg elevation.  She takes ibuprofen as needed for pain.  Past Medical History:  Diagnosis Date  . Carpal tunnel syndrome, bilateral 08/26/2018  . Chest pain    negative myoview 02/14/2015  . Chronic constipation   . Cirrhosis ([place])   . DDD (degenerative disc disease), lumbar   . Depression   . Fibromyalgia   . Hepatitis C    treated, none since 2016  . Hepatitis C approx. 2004  . History of kidney stones   . Hypertension   . Hypothyroidism   . Liver disease   . Peripheral neuropathy 08/26/2018  . Spleen enlarged   . Thrombocytopenia ([place])   . Vision abnormalities     Family History  Problem Relation Age of Onset  . Healthy Mother   . Diabetes Father   . Heart disease Father   . Hypertension Father   . Healthy Brother   . Healthy Brother   . Healthy Daughter   .  Healthy Son   . Lung cancer Maternal Uncle     SOCIAL HISTORY: Social History   Tobacco Use  . Smoking status: Current Every Day Smoker    Packs/day: 1.00    Years: 44.00    Pack years: 44.00    Types: Cigarettes  . Smokeless tobacco: Never Used  . Tobacco comment: down to 0.5ppd  Substance Use Topics  . Alcohol use: No    Allergies  Allergen Reactions  . Ampicillin Hives, Shortness Of Breath and Swelling    Lips swelling Has patient had a PCN reaction causing immediate rash, facial/tongue/throat swelling, SOB or lightheadedness with hypotension: Yes Has patient had a PCN reaction causing severe rash involving mucus membranes or skin necrosis: No Has patient had a PCN reaction that required hospitalization: No Has patient had a PCN reaction occurring within the last 10 years: Yes If all of the above answers are "NO", then may proceed with Cephalosporin use.   . Carbamazepine     Nausea, headache  . Ciprofloxacin Hives  . Dilantin [Phenytoin Sodium Extended]     nausea  . Sulfamethoxazole Hives    Current Outpatient Medications  Medication Sig Dispense Refill  . alum & mag hydroxide-simeth (MAALOX/MYLANTA) 200-200-20 MG/5ML suspension Take 15 mLs by mouth every 6 (six) hours as needed for indigestion or heartburn.    Marland Kitchen amLODipine (NORVASC) 5  MG tablet Take 5 mg by mouth daily.    Marland Kitchen aspirin EC 81 MG tablet Take 81 mg by mouth daily.    Marland Kitchen atorvastatin (LIPITOR) 10 MG tablet Take 1 tablet by mouth daily.    Marland Kitchen BELBUCA 150 MCG FILM 450 mcg 2 (two) times daily.   0  . buPROPion (ZYBAN) 150 MG 12 hr tablet TAKE 1 TABLET BY MOUTH TWICE A DAY 60 tablet 0  . Cholecalciferol (VITAMIN D PO) Take 1,000 Units by mouth daily.    Marland Kitchen dicyclomine (BENTYL) 10 MG capsule Take 1 capsule (10 mg total) by mouth 3 (three) times daily as needed for spasms. 90 capsule 2  . gabapentin (NEURONTIN) 300 MG capsule Take 1,200 mg by mouth 3 (three) times daily.     Marland Kitchen levETIRAcetam (KEPPRA) 250 MG  tablet Take 1 tablet (250 mg total) by mouth 2 (two) times daily. 60 tablet 11  . levothyroxine (SYNTHROID) 100 MCG tablet Take 100 mcg by mouth daily before breakfast.    . lisinopril (ZESTRIL) 40 MG tablet Take 40 mg by mouth daily.    . methocarbamol (ROBAXIN) 500 MG tablet Take 500-750 mg by mouth 2 (two) times daily as needed for muscle spasms (takes 1 tablet in the morning and 1.5 tablet at bedtime). Takes a dose every morning and another dose at bedtime only if needed for pain    . metoCLOPramide (REGLAN) 5 MG tablet TAKE ONE TABLET BY MOUTH TWICE DAILY 60 tablet 5  . mirtazapine (REMERON) 15 MG tablet TAKE 1 TABLET BY MOUTH EVERY DAY AT NIGHT  2  . Multiple Vitamin (MULTI-VITAMINS) TABS Take 1 tablet by mouth daily.    . naproxen sodium (ANAPROX) 220 MG tablet Take 440 mg by mouth daily as needed (pain).    . ondansetron (ZOFRAN) 4 MG tablet Take 1 tablet (4 mg total) by mouth every 8 (eight) hours as needed for nausea or vomiting. Patient takes in the morning. 30 tablet 2  . oxyCODONE (OXY IR/ROXICODONE) 5 MG immediate release tablet Take 10 mg by mouth every 8 (eight) hours as needed for moderate pain or severe pain (depends on pain level if takes 1-3 tablets).     . promethazine (PHENERGAN) 25 MG tablet Take 25 mg by mouth every 8 (eight) hours as needed for nausea or vomiting (only takes when zofran does not work).     . sertraline (ZOLOFT) 100 MG tablet Take 100 mg by mouth daily.     . Tiotropium Bromide-Olodaterol (STIOLTO RESPIMAT) 2.5-2.5 MCG/ACT AERS Inhale 2 puffs into the lungs daily. 4 g 5  . umeclidinium-vilanterol (ANORO ELLIPTA) 62.5-25 MCG/INH AEPB Inhale 1 puff into the lungs daily. 1 each 11  . vitamin B-12 (CYANOCOBALAMIN) 500 MCG tablet Take 1,000 mcg by mouth daily.      No current facility-administered medications for this visit.    REVIEW OF SYSTEMS:  [X]  denotes positive finding, [ ]  denotes negative finding Cardiac  Comments:  Chest pain or chest pressure:      Shortness of breath upon exertion:    Short of breath when lying flat:    Irregular heart rhythm:        Vascular    Pain in calf, thigh, or hip brought on by ambulation:    Pain in feet at night that wakes you up from your sleep:     Blood clot in your veins:    Leg swelling:  x       Pulmonary  Oxygen at home:    Productive cough:     Wheezing:         Neurologic    Sudden weakness in arms or legs:     Sudden numbness in arms or legs:     Sudden onset of difficulty speaking or slurred speech:    Temporary loss of vision in one eye:     Problems with dizziness:         Gastrointestinal    Blood in stool:     Vomited blood:         Genitourinary    Burning when urinating:     Blood in urine:        Psychiatric    Major depression:         Hematologic    Bleeding problems:    Problems with blood clotting too easily:        Skin    Rashes or ulcers:        Constitutional    Fever or chills:     PHYSICAL EXAM:   There were no vitals filed for this visit.  GENERAL: The patient is a well-nourished female, in no acute distress. The vital signs are documented above. CARDIAC: There is a regular rate and rhythm.  VASCULAR: I do not detect carotid bruits. She has palpable posterior tibial pulses bilaterally. I did look at her great saphenous vein myself with the SonoSite she has significant reflux from the saphenofemoral junction down to the knee.  The vein is dilated from the mid thigh up.  There is a large superficial epigastric vein noted. I do not see any venous collaterals around her abdomen. PULMONARY: There is good air exchange bilaterally without wheezing or rales. ABDOMEN: Soft and non-tender with normal pitched bowel sounds.  MUSCULOSKELETAL: There are no major deformities or cyanosis. NEUROLOGIC: No focal weakness or paresthesias are detected. SKIN: There are no ulcers or rashes noted. PSYCHIATRIC: The patient has a normal affect.  DATA:    DUPLEX  ABDOMINAL AORTA: I have independently interpreted her duplex of the abdominal aorta.  The maximum diameter of the infrarenal aorta is 2.6 cm.  Thus she does not have an abdominal aortic aneurysm.  MEDICAL ISSUES:   CHRONIC VENOUS INSUFFICIENCY: The patient's history is somewhat complicated.  She is continuing to have pain in her medial thigh and medial calf where she has some large varicose veins.  I think the symptoms would benefit from laser ablation of the left great saphenous vein with 10-20 stabs.  However she is also having significant pain in the groin and given that we need to stay distal to the takeoff of the superficial epigastric vein I am not confident that laser ablation will necessarily relieve her pain in the groin.  We could consider high ligation of the saphenous vein with stripping but would like to avoid surgery if at all possible.  Another option would be to proceed with laser ablation of the left great saphenous vein with 10-20 stabs and if she continues to have pain in the groin consider high ligation of the left great saphenous vein.  We have discussed all these options in detail.  In addition she will continue with her conservative treatment including leg elevation, compression stockings, and trying to avoid prolonged sitting and standing.  She will call if she elects to proceed with laser ablation and stabs.  SCREENING FOR ABDOMINAL AORTIC ANEURYSM: The patient's father died from a ruptured abdominal aortic aneurysm.  Her screening  study shows no evidence of an abdominal aortic aneurysm.  Deitra Mayo [facility] [phone]

## 2020-01-05 ENCOUNTER — Other Ambulatory Visit: Payer: Self-pay

## 2020-01-05 ENCOUNTER — Other Ambulatory Visit (HOSPITAL_COMMUNITY)
Admission: RE | Admit: 2020-01-05 | Discharge: 2020-01-05 | Disposition: A | Discharge status: Home / Self Care | Payer: Medicare Other | Source: Ambulatory Visit | Attending: Internal Medicine | Admitting: Internal Medicine

## 2020-01-08 ENCOUNTER — Ambulatory Visit: Payer: Medicare Other | Admitting: Primary Care

## 2020-01-08 ENCOUNTER — Other Ambulatory Visit (HOSPITAL_COMMUNITY): Payer: Medicare Other

## 2020-01-22 ENCOUNTER — Other Ambulatory Visit (HOSPITAL_COMMUNITY)
Admission: RE | Admit: 2020-01-22 | Discharge: 2020-01-22 | Disposition: A | Discharge status: Home / Self Care | Payer: Medicare Other | Source: Ambulatory Visit | Attending: Primary Care | Admitting: Primary Care

## 2020-01-22 ENCOUNTER — Other Ambulatory Visit: Payer: Self-pay

## 2020-01-22 DIAGNOSIS — U071 COVID-19: Secondary | ICD-10-CM | POA: Diagnosis not present

## 2020-01-22 DIAGNOSIS — Z01812 Encounter for preprocedural laboratory examination: Secondary | ICD-10-CM | POA: Insufficient documentation

## 2020-01-23 LAB — SARS CORONAVIRUS 2 (TAT 6-24 HRS): SARS Coronavirus 2: POSITIVE — AB

## 2020-01-24 ENCOUNTER — Telehealth: Payer: Self-pay | Admitting: Adult Health

## 2020-01-24 NOTE — Telephone Encounter (Signed)
Called to discuss with Wende Crease about Covid symptoms and the use of bamlanivimab, a monoclonal antibody infusion for those with mild to moderate Covid symptoms and at a high risk of hospitalization.   Unable to reach , left message on voicemail to call back to infusion center hotline.     Patient Active Problem List   Diagnosis Date Noted  . IBS (irritable colon syndrome) 09/15/2019  . Abdominal pain, chronic, left upper quadrant 09/15/2019  . Back pain 04/29/2019  . Peripheral neuropathy 08/26/2018  . Carpal tunnel syndrome, bilateral 08/26/2018  . Nausea without vomiting 06/17/2017  . Abdominal pain 06/17/2017  . Chest pain 02/13/2015  . Chest pain, rule out acute myocardial infarction 02/12/2015  . Hepatitis C 09/28/2013  . Constipation, chronic 08/03/2013  . Abdominal pain, left lower quadrant 08/03/2013  . Cirrhosis ([place]) 08/03/2013  . Hyperlipidemia 04/25/2009  . OVERWEIGHT/OBESITY 04/25/2009  . CAD, NATIVE VESSEL 04/25/2009  . DEPRESSION 01/21/2008  . Essential hypertension 01/21/2008  . ESOPHAGITIS, REFLUX 03/10/2007  . COLONIC POLYPS, HYPERPLASTIC 02/28/2007     Searcy Miyoshi NP-C

## 2020-01-25 ENCOUNTER — Other Ambulatory Visit: Payer: Self-pay | Admitting: Internal Medicine

## 2020-01-25 ENCOUNTER — Ambulatory Visit: Payer: Medicare Other | Admitting: Primary Care

## 2020-01-25 NOTE — Progress Notes (Signed)
Patient aware of positive covid screen.

## 2020-01-25 NOTE — Progress Notes (Signed)
COVID positive, will not be able to have PFTs.

## 2020-02-09 ENCOUNTER — Encounter (INDEPENDENT_AMBULATORY_CARE_PROVIDER_SITE_OTHER): Payer: Self-pay | Admitting: *Deleted

## 2020-04-04 ENCOUNTER — Other Ambulatory Visit (HOSPITAL_COMMUNITY)
Admission: RE | Admit: 2020-04-04 | Discharge: 2020-04-04 | Disposition: A | Discharge status: Home / Self Care | Payer: Medicare Other | Source: Ambulatory Visit | Attending: Primary Care | Admitting: Primary Care

## 2020-04-04 ENCOUNTER — Other Ambulatory Visit: Payer: Self-pay

## 2020-04-04 ENCOUNTER — Encounter (INDEPENDENT_AMBULATORY_CARE_PROVIDER_SITE_OTHER): Payer: Self-pay | Admitting: Gastroenterology

## 2020-04-04 ENCOUNTER — Ambulatory Visit (INDEPENDENT_AMBULATORY_CARE_PROVIDER_SITE_OTHER): Payer: Medicare Other | Admitting: Gastroenterology

## 2020-04-04 ENCOUNTER — Encounter (INDEPENDENT_AMBULATORY_CARE_PROVIDER_SITE_OTHER): Payer: Self-pay | Admitting: *Deleted

## 2020-04-04 VITALS — BP 133/70 | HR 75 | Temp 100.0°F | Ht 70.0 in | Wt 229.3 lb

## 2020-04-04 DIAGNOSIS — R194 Change in bowel habit: Secondary | ICD-10-CM | POA: Diagnosis not present

## 2020-04-04 DIAGNOSIS — Z01812 Encounter for preprocedural laboratory examination: Secondary | ICD-10-CM | POA: Insufficient documentation

## 2020-04-04 DIAGNOSIS — Z20822 Contact with and (suspected) exposure to covid-19: Secondary | ICD-10-CM | POA: Diagnosis not present

## 2020-04-04 DIAGNOSIS — K7460 Unspecified cirrhosis of liver: Secondary | ICD-10-CM | POA: Diagnosis not present

## 2020-04-04 MED ORDER — HYOSCYAMINE SULFATE SL 0.125 MG SL SUBL
0.1250 mg | SUBLINGUAL_TABLET | Freq: Two times a day (BID) | SUBLINGUAL | 3 refills | Status: DC | PRN
Start: 2020-04-04 — End: 2020-08-17

## 2020-04-04 NOTE — Patient Instructions (Signed)
We are checking stool studies and arranging ultrasound for evaluation.

## 2020-04-04 NOTE — Progress Notes (Signed)
Patient profile: Teresa Benitez is a 64 y.o. female seen for follow up of IBS. Last seen in clinic 08/2019.  At that visit had chronic left upper quadrant pain felt to be neuropathic, she was prescribed a OTC Lidoderm skin patch which and was recommended to discuss a nerve block with her pain clinic.  Also followed for history of hepatitis C cirrhosis.  History of Present Illness: Teresa Benitez is seen today for follow up of continued symptoms. She continues to left upper quadrant pain-ongoing about 5 years, throbs like a tooth ache. Worse if lays on left side but does not worsen with eating. She has seen neurology and pain management for this.   Also notes IBS symptoms worse since last OV. Feeling a lot of spasms despite dicyclomine (taking 3x/day). Currently over past 10 days having 3-4 BM/day, these are formed but with more mucus recently, also having worsening spasms in her abd. She denies any blood in stools.  She also endorses new onset of nausea 10 days ago when developed diarrhea, she is taking zofran (helps sometimes but not always). If no improvement w/ zofran will try phenergan. She denies vomiting. No GERD symptom. No dysphagia   Since her last OV she has gotten a TENS unit which helped LUQ pain some.    Naproxen few times a years.   Had mederna vaccine 4 days ago. Smokes 1ppd. No alcohol.   Wt Readings from Last 3 Encounters:  04/04/20 229 lb 4.8 oz (104 kg)  12/31/19 229 lb (103.9 kg)  09/15/19 230 lb 9.6 oz (104.6 kg)     Last Colonoscopy: per care everywhere 2017 at [facility] but unable to review actual report  Last Endoscopy: 07/2017-   Past Medical History:  Past Medical History:  Diagnosis Date   Carpal tunnel syndrome, bilateral 08/26/2018   Chest pain    negative myoview 02/14/2015   Chronic constipation    Cirrhosis ([place])    DDD (degenerative disc disease), lumbar    Depression    Fibromyalgia    Hepatitis C    treated, none since 2016    Hepatitis C approx. 2004   History of kidney stones    Hypertension    Hypothyroidism    Liver disease    Peripheral neuropathy 08/26/2018   Spleen enlarged    Thrombocytopenia ([place])    Vision abnormalities     Problem List: Patient Active Problem List   Diagnosis Date Noted   IBS (irritable colon syndrome) 09/15/2019   Abdominal pain, chronic, left upper quadrant 09/15/2019   Back pain 04/29/2019   Peripheral neuropathy 08/26/2018   Carpal tunnel syndrome, bilateral 08/26/2018   Nausea without vomiting 06/17/2017   Abdominal pain 06/17/2017   Chest pain 02/13/2015   Chest pain, rule out acute myocardial infarction 02/12/2015   Hepatitis C 09/28/2013   Constipation, chronic 08/03/2013   Abdominal pain, left lower quadrant 08/03/2013   Cirrhosis ([place]) 08/03/2013   Hyperlipidemia 04/25/2009   OVERWEIGHT/OBESITY 04/25/2009   CAD, NATIVE VESSEL 04/25/2009   DEPRESSION 01/21/2008   Essential hypertension 01/21/2008   ESOPHAGITIS, REFLUX 03/10/2007   COLONIC POLYPS, HYPERPLASTIC 02/28/2007    Past Surgical History: Past Surgical History:  Procedure Laterality Date   CARDIAC CATHETERIZATION     with stent   [place]  5-6 years ago    Done In [place]   ESOPHAGOGASTRODUODENOSCOPY (EGD) WITH PROPOFOL N/A 08/23/2017   Procedure: ESOPHAGOGASTRODUODENOSCOPY (EGD) WITH PROPOFOL;  Surgeon: Rogene Houston, MD;  Location: [facility];  Service: Endoscopy;  Laterality: N/A;   Para Thyhoid  2005   PARTIAL HYSTERECTOMY     Patient states that she had 16 years ago?1998   UPPER GASTROINTESTINAL ENDOSCOPY      Allergies: Allergies  Allergen Reactions   Ampicillin Hives, Shortness Of Breath and Swelling    Lips swelling Has patient had a PCN reaction causing immediate rash, facial/tongue/throat swelling, SOB or lightheadedness with hypotension: Yes Has patient had a PCN reaction causing severe rash involving mucus  membranes or skin necrosis: No Has patient had a PCN reaction that required hospitalization: No Has patient had a PCN reaction occurring within the last 10 years: Yes If all of the above answers are "NO", then may proceed with Cephalosporin use.    Carbamazepine     Nausea, headache   Ciprofloxacin Hives   Dilantin [Phenytoin Sodium Extended]     nausea   Sulfamethoxazole Hives      Home Medications:  Current Outpatient Medications:    alum & mag hydroxide-simeth (MAALOX/MYLANTA) 200-200-20 MG/5ML suspension, Take 15 mLs by mouth every 6 (six) hours as needed for indigestion or heartburn., Disp: , Rfl:    amLODipine (NORVASC) 5 MG tablet, Take 5 mg by mouth daily., Disp: , Rfl:    aspirin EC 81 MG tablet, Take 81 mg by mouth daily., Disp: , Rfl:    atorvastatin (LIPITOR) 10 MG tablet, Take 1 tablet by mouth daily., Disp: , Rfl:    BELBUCA 150 MCG FILM, 450 mcg 2 (two) times daily. , Disp: , Rfl: 0   buPROPion (ZYBAN) 150 MG 12 hr tablet, TAKE 1 TABLET BY MOUTH TWICE A DAY, Disp: 60 tablet, Rfl: 0   Cholecalciferol (VITAMIN D PO), Take 1,000 Units by mouth daily., Disp: , Rfl:    dicyclomine (BENTYL) 10 MG capsule, Take 1 capsule (10 mg total) by mouth 3 (three) times daily as needed for spasms., Disp: 90 capsule, Rfl: 2   gabapentin (NEURONTIN) 300 MG capsule, Take 1,200 mg by mouth 3 (three) times daily. , Disp: , Rfl:    levETIRAcetam (KEPPRA) 250 MG tablet, Take 1 tablet (250 mg total) by mouth 2 (two) times daily., Disp: 60 tablet, Rfl: 11   levothyroxine (SYNTHROID) 100 MCG tablet, Take 125 mcg by mouth daily before breakfast. , Disp: , Rfl:    lisinopril (ZESTRIL) 40 MG tablet, Take 40 mg by mouth daily., Disp: , Rfl:    methocarbamol (ROBAXIN) 500 MG tablet, Take 500-750 mg by mouth 2 (two) times daily as needed for muscle spasms (takes 1 tablet in the morning and 1.5 tablet at bedtime). Takes a dose every morning and another dose at bedtime only if needed for  pain, Disp: , Rfl:    metoCLOPramide (REGLAN) 5 MG tablet, TAKE ONE TABLET BY MOUTH TWICE DAILY, Disp: 60 tablet, Rfl: 5   mirtazapine (REMERON) 15 MG tablet, TAKE 1 TABLET BY MOUTH EVERY DAY AT NIGHT, Disp: , Rfl: 2   Multiple Vitamin (MULTI-VITAMINS) TABS, Take 1 tablet by mouth daily., Disp: , Rfl:    naproxen sodium (ANAPROX) 220 MG tablet, Take 440 mg by mouth daily as needed (pain)., Disp: , Rfl:    ondansetron (ZOFRAN) 4 MG tablet, Take 1 tablet (4 mg total) by mouth every 8 (eight) hours as needed for nausea or vomiting. Patient takes in the morning., Disp: 30 tablet, Rfl: 2   oxyCODONE (OXY IR/ROXICODONE) 5 MG immediate release tablet, Take 10 mg by mouth every 8 (eight) hours  as needed for moderate pain or severe pain (depends on pain level if takes 1-3 tablets). , Disp: , Rfl:    promethazine (PHENERGAN) 25 MG tablet, Take 25 mg by mouth every 8 (eight) hours as needed for nausea or vomiting (only takes when zofran does not work). , Disp: , Rfl:    propranolol (INDERAL) 10 MG tablet, Take 10 mg by mouth 2 (two) times daily. , Disp: , Rfl:    sertraline (ZOLOFT) 100 MG tablet, Take 100 mg by mouth daily. , Disp: , Rfl:    Tiotropium Bromide-Olodaterol (STIOLTO RESPIMAT) 2.5-2.5 MCG/ACT AERS, Inhale 2 puffs into the lungs daily., Disp: 4 g, Rfl: 5   vitamin B-12 (CYANOCOBALAMIN) 500 MCG tablet, Take 1,000 mcg by mouth daily. , Disp: , Rfl:    Hyoscyamine Sulfate SL (LEVSIN/SL) 0.125 MG SUBL, Place 0.125 mg under the tongue 2 (two) times daily as needed (diarrhea, abd pain)., Disp: 60 tablet, Rfl: 3   Family History: family history includes Diabetes in her father; Healthy in her brother, brother, daughter, mother, and son; Heart disease in her father; Hypertension in her father; Lung cancer in her maternal uncle.    Social History:   reports that she has been smoking cigarettes. She has a 44.00 pack-year smoking history. She has never used smokeless tobacco. She reports that she  does not drink alcohol and does not use drugs.   Review of Systems: Constitutional: Denies weight loss/weight gain  Eyes: No changes in vision. ENT: No oral lesions, sore throat.  GI: see HPI.  Heme/Lymph: No easy bruising.  CV: No chest pain.  GU: No hematuria.  Integumentary: No rashes.  Neuro: No headaches.  Psych: No depression/anxiety.  Endocrine: No heat/cold intolerance.  Allergic/Immunologic: No urticaria.  Resp: No cough, SOB.  Musculoskeletal: No joint swelling.    Physical Examination: BP 133/70 (BP Location: Right Arm, Patient Position: Sitting, Cuff Size: Large)    Pulse 75    Temp 100 F (37.8 C) (Oral)    Ht 5\' 10"  (1.778 m)    Wt 229 lb 4.8 oz (104 kg)    BMI 32.90 kg/m  Gen: NAD, alert and oriented x 4 HEENT: PEERLA, EOMI, Neck: supple, no JVD Chest: CTA bilaterally, no wheezes, crackles, or other adventitious sounds CV: RRR, no m/g/c/r Abd: soft, NT, ND, +BS in all four quadrants; no HSM, guarding, ridigity, or rebound tenderness Ext: no edema, well perfused with 2+ pulses, Skin: no rash or lesions noted on observed skin Lymph: no noted LAD  Data Reviewed:  October 2020-normal rheumatoid factor, CBC with hemoglobin 12.9 and platelet 112, CMP with normal LFTs.  Albumin 3.9.     Assessment/Plan: Ms. Kildow is a 64 y.o. female seen 10 days of diarrhea.  Will check stool studies for evaluation.  She would like to try Levsin instead of dicyclomine to see if improves symptoms.  She has antiemetic to use PRN.  Suspected infectious etiology, if negative would consider colonoscopy.  2.  Cirrhosis-history of hepatitis C-she is due for her AFP and ultrasound.  She had a CBC and CMP in June 2021 remarkable for platelets of 129 and albumin 3.1.  No evidence of ascites, encephalopathy, etc. Will need to f/up at next OV if immune to hep A&B   Valinda was seen today for follow-up.  Diagnoses and all orders for this visit:  Change in bowel habits -      Gastrointestinal Pathogen Panel PCR -     C. difficile GDH and Toxin  A/B  Cirrhosis of liver without ascites, unspecified hepatic cirrhosis type (HCC) -     AFP tumor marker -     US Abdomen Complete; Future  Other orders -     Hyoscyamine Sulfate SL (LEVSIN/SL) 0.125 MG SUBL; Place 0.125 mg under the tongue 2 (two) times daily as needed (diarrhea, abd pain).       I personally performed the service, non-incident to. (WP)  Laurine Blazer, [facility]

## 2020-04-05 LAB — AFP TUMOR MARKER: AFP-Tumor Marker: 3.1 ng/mL

## 2020-04-05 LAB — SARS CORONAVIRUS 2 (TAT 6-24 HRS): SARS Coronavirus 2: NEGATIVE

## 2020-04-06 ENCOUNTER — Ambulatory Visit (INDEPENDENT_AMBULATORY_CARE_PROVIDER_SITE_OTHER): Payer: Medicare Other | Admitting: Gastroenterology

## 2020-04-07 ENCOUNTER — Ambulatory Visit (INDEPENDENT_AMBULATORY_CARE_PROVIDER_SITE_OTHER): Payer: Medicare Other | Admitting: Primary Care

## 2020-04-07 ENCOUNTER — Other Ambulatory Visit: Payer: Self-pay

## 2020-04-07 ENCOUNTER — Encounter: Payer: Self-pay | Admitting: Primary Care

## 2020-04-07 ENCOUNTER — Ambulatory Visit (INDEPENDENT_AMBULATORY_CARE_PROVIDER_SITE_OTHER): Payer: Medicare Other | Admitting: Internal Medicine

## 2020-04-07 VITALS — BP 132/70 | HR 64 | Temp 98.6°F | Ht 69.0 in | Wt 231.4 lb

## 2020-04-07 DIAGNOSIS — Z720 Tobacco use: Secondary | ICD-10-CM

## 2020-04-07 DIAGNOSIS — R918 Other nonspecific abnormal finding of lung field: Secondary | ICD-10-CM

## 2020-04-07 DIAGNOSIS — R911 Solitary pulmonary nodule: Secondary | ICD-10-CM

## 2020-04-07 DIAGNOSIS — J449 Chronic obstructive pulmonary disease, unspecified: Secondary | ICD-10-CM | POA: Diagnosis not present

## 2020-04-07 DIAGNOSIS — J432 Centrilobular emphysema: Secondary | ICD-10-CM

## 2020-04-07 LAB — PULMONARY FUNCTION TEST
DL/VA % pred: 79 %
DL/VA: 3.2 ml/min/mmHg/L
DLCO cor % pred: 67 %
DLCO cor: 15.93 ml/min/mmHg
DLCO unc % pred: 67 %
DLCO unc: 15.93 ml/min/mmHg
FEF 25-75 Post: 2.04 L/sec
FEF 25-75 Pre: 1.17 L/sec
FEF2575-%Change-Post: 73 %
FEF2575-%Pred-Post: 80 %
FEF2575-%Pred-Pre: 46 %
FEV1-%Change-Post: 14 %
FEV1-%Pred-Post: 68 %
FEV1-%Pred-Pre: 60 %
FEV1-Post: 2.04 L
FEV1-Pre: 1.78 L
FEV1FVC-%Change-Post: 0 %
FEV1FVC-%Pred-Pre: 93 %
FEV6-%Change-Post: 13 %
FEV6-%Pred-Post: 75 %
FEV6-%Pred-Pre: 66 %
FEV6-Post: 2.8 L
FEV6-Pre: 2.46 L
FEV6FVC-%Change-Post: 0 %
FEV6FVC-%Pred-Post: 102 %
FEV6FVC-%Pred-Pre: 103 %
FVC-%Change-Post: 14 %
FVC-%Pred-Post: 73 %
FVC-%Pred-Pre: 64 %
FVC-Post: 2.84 L
FVC-Pre: 2.47 L
Post FEV1/FVC ratio: 72 %
Post FEV6/FVC ratio: 99 %
Pre FEV1/FVC ratio: 72 %
Pre FEV6/FVC Ratio: 100 %
RV % pred: 109 %
RV: 2.53 L
TLC % pred: 91 %
TLC: 5.33 L

## 2020-04-07 MED ORDER — BUPROPION HCL ER (SMOKING DET) 150 MG PO TB12: 150.0000 mg | ORAL_TABLET | Freq: Two times a day (BID) | ORAL | 1 refills | Status: DC

## 2020-04-07 MED ORDER — TRELEGY ELLIPTA 100-62.5-25 MCG/INH IN AEPB
1.0000 | INHALATION_SPRAY | Freq: Every day | RESPIRATORY_TRACT | 0 refills | Status: DC
Start: 2020-04-07 — End: 2020-04-27

## 2020-04-07 NOTE — Progress Notes (Signed)
PFT done today. 

## 2020-04-07 NOTE — Patient Instructions (Addendum)
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 Shearon Stalls or APP Please schedule appointment for smoking cessation with pulmonary pharmacy team        Chronic Obstructive Pulmonary Disease Chronic obstructive pulmonary disease (COPD) is a long-term (chronic) lung problem. When you have COPD, it is hard for air to get in and out of your lungs. Usually the condition gets worse over time, and your lungs will never return to normal. There are things you can do to keep yourself as healthy as possible.  Your doctor may treat your condition with: ? Medicines. ? Oxygen. ? Lung surgery.  Your doctor may also recommend: ? Rehabilitation. This includes steps to make your body work better. It may involve a team of specialists. ? Quitting smoking, if you smoke. ? Exercise and changes to your diet. ? Comfort measures (palliative care). Follow these instructions at home: Medicines  Take over-the-counter and prescription medicines only as told by your doctor.  Talk to your doctor before taking any cough or allergy medicines. You may need to avoid medicines that cause your lungs to be dry. Lifestyle  If you smoke, stop. Smoking makes the problem worse. If you need help quitting, ask your doctor.  Avoid being around things that make your breathing worse. This may include smoke, chemicals, and fumes.  Stay active, but remember to rest as well.  Learn and use tips on how to relax.  Make sure you get enough sleep. Most adults need at least 7 hours of sleep every night.  Eat healthy  foods. Eat smaller meals more often. Rest before meals. Controlled breathing Learn and use tips on how to control your breathing as told by your doctor. Try:  Breathing in (inhaling) through your nose for 1 second. Then, pucker your lips and breath out (exhale) through your lips for 2 seconds.  Putting one hand on your belly (abdomen). Breathe in slowly through your nose for 1 second. Your hand on your belly should move out. Pucker your lips and breathe out slowly through your lips. Your hand on your belly should move in as you breathe out.  Controlled coughing Learn and use controlled coughing to clear mucus from your lungs. Follow these steps: 1. Lean your head a little forward. 2. Breathe in deeply. 3. Try to hold your breath for 3 seconds. 4. Keep your mouth slightly open while coughing 2 times. 5. Spit any mucus out into a tissue. 6. Rest and do the steps again 1 or 2 times as needed. General instructions  Make sure you get all the shots (vaccines) that your doctor recommends. Ask your doctor about a flu shot and a pneumonia shot.  Use oxygen therapy and pulmonary rehabilitation if told by your doctor. If you need home oxygen therapy, ask your doctor if you should buy a tool to measure your oxygen level (oximeter).  Make a COPD action plan with your doctor. This helps you to know what to do if you feel worse than usual.  Manage any other conditions you have as told by your doctor.  Avoid going outside  when it is very hot, cold, or humid.  Avoid people who have a sickness you can catch (contagious).  Keep all follow-up visits as told by your doctor. This is important. Contact a doctor if:  You cough up more mucus than usual.  There is a change in the color or thickness of the mucus.  It is harder to breathe than usual.  Your breathing is faster than usual.  You have trouble sleeping.  You need to use your medicines more often than usual.  You have trouble doing your  normal activities such as getting dressed or walking around the house. Get help right away if:  You have shortness of breath while resting.  You have shortness of breath that stops you from: ? Being able to talk. ? Doing normal activities.  Your chest hurts for longer than 5 minutes.  Your skin color is more blue than usual.  Your pulse oximeter shows that you have low oxygen for longer than 5 minutes.  You have a fever.  You feel too tired to breathe normally. Summary  Chronic obstructive pulmonary disease (COPD) is a long-term lung problem.  The way your lungs work will never return to normal. Usually the condition gets worse over time. There are things you can do to keep yourself as healthy as possible.  Take over-the-counter and prescription medicines only as told by your doctor.  If you smoke, stop. Smoking makes the problem worse. This information is not intended to replace advice given to you by your health care provider. Make sure you discuss any questions you have with your health care provider. Document Revised: 08/23/2017 Document Reviewed: 10/15/2016 Elsevier Patient Education  2020 [employer].    Steps to Quit Smoking Smoking tobacco is the leading cause of preventable death. It can affect almost every organ in the body. Smoking puts you and people around you at risk for many serious, long-lasting (chronic) diseases. Quitting smoking can be hard, but it is one of the best things that you can do for your health. It is never too late to quit. How do I get ready to quit? When you decide to quit smoking, make a plan to help you succeed. Before you quit:  Pick a date to quit. Set a date within the next 2 weeks to give you time to prepare.  Write down the reasons why you are quitting. Keep this list in places where you will see it often.  Tell your family, friends, and co-workers that you are quitting. Their support is important.  Talk with your doctor about the  choices that may help you quit.  Find out if your health insurance will pay for these treatments.  Know the people, places, things, and activities that make you want to smoke (triggers). Avoid them. What first steps can I take to quit smoking?  Throw away all cigarettes at home, at work, and in your car.  Throw away the things that you use when you smoke, such as ashtrays and lighters.  Clean your car. Make sure to empty the ashtray.  Clean your home, including curtains and carpets. What can I do to help me quit smoking? Talk with your doctor about taking medicines and seeing a counselor at the same time. You are more likely to succeed when you do both.  If you are pregnant or breastfeeding, talk with your doctor about counseling or other ways to quit smoking. Do not take medicine to help you quit smoking unless your doctor  tells you to do so. To quit smoking: Quit right away  Quit smoking totally, instead of slowly cutting back on how much you smoke over a period of time.  Go to counseling. You are more likely to quit if you go to counseling sessions regularly. Take medicine You may take medicines to help you quit. Some medicines need a prescription, and some you can buy over-the-counter. Some medicines may contain a drug called nicotine to replace the nicotine in cigarettes. Medicines may:  Help you to stop having the desire to smoke (cravings).  Help to stop the problems that come when you stop smoking (withdrawal symptoms). Your doctor may ask you to use:  Nicotine patches, gum, or lozenges.  Nicotine inhalers or sprays.  Non-nicotine medicine that is taken by mouth. Find resources Find resources and other ways to help you quit smoking and remain smoke-free after you quit. These resources are most helpful when you use them often. They include:  Online chats with a Social worker.  Phone quitlines.  Printed Furniture conservator/restorer.  Support groups or group counseling.  Text  messaging programs.  Mobile phone apps. Use apps on your mobile phone or tablet that can help you stick to your quit plan. There are many free apps for mobile phones and tablets as well as websites. Examples include Quit Guide from the [employer] and smokefree.gov  What things can I do to make it easier to quit?   Talk to your family and friends. Ask them to support and encourage you.  Call a phone quitline (1-800-QUIT-NOW), reach out to support groups, or work with a Social worker.  Ask people who smoke to not smoke around you.  Avoid places that make you want to smoke, such as: ? Bars. ? Parties. ? Smoke-break areas at work.  Spend time with people who do not smoke.  Lower the stress in your life. Stress can make you want to smoke. Try these things to help your stress: ? Getting regular exercise. ? Doing deep-breathing exercises. ? Doing yoga. ? Meditating. ? Doing a body scan. To do this, close your eyes, focus on one area of your body at a time from head to toe. Notice which parts of your body are tense. Try to relax the muscles in those areas. How will I feel when I quit smoking? Day 1 to 3 weeks Within the first 24 hours, you may start to have some problems that come from quitting tobacco. These problems are very bad 2-3 days after you quit, but they do not often last for more than 2-3 weeks. You may get these symptoms:  Mood swings.  Feeling restless, nervous, angry, or annoyed.  Trouble concentrating.  Dizziness.  Strong desire for high-sugar foods and nicotine.  Weight gain.  Trouble pooping (constipation).  Feeling like you may vomit (nausea).  Coughing or a sore throat.  Changes in how the medicines that you take for other issues work in your body.  Depression.  Trouble sleeping (insomnia). Week 3 and afterward After the first 2-3 weeks of quitting, you may start to notice more positive results, such as:  Better sense of smell and taste.  Less coughing and  sore throat.  Slower heart rate.  Lower blood pressure.  Clearer skin.  Better breathing.  Fewer sick days. Quitting smoking can be hard. Do not give up if you fail the first time. Some people need to try a few times before they succeed. Do your best to stick to your quit plan, and  talk with your doctor if you have any questions or concerns. Summary  Smoking tobacco is the leading cause of preventable death. Quitting smoking can be hard, but it is one of the best things that you can do for your health.  When you decide to quit smoking, make a plan to help you succeed.  Quit smoking right away, not slowly over a period of time.  When you start quitting, seek help from your doctor, family, or friends. This information is not intended to replace advice given to you by your health care provider. Make sure you discuss any questions you have with your health care provider. Document Revised: 06/05/2019 Document Reviewed: 11/29/2018 Elsevier Patient Education  [place].

## 2020-04-07 NOTE — Progress Notes (Signed)
@Patient  ID: Teresa Benitez, female    DOB: 19-Feb-1956, 64 y.o.   MRN: [id number]  Chief Complaint  Patient presents with  . Follow-up    PFT today-sob same,occass. wheezing    Referring provider: Leeanne Rio, MD  HPI: 64 year old female, current smoker smoker half pack daily (40-pack-year history). Past medical history significant for centrilobular emphysema, hepatitis C cirrhosis.  Patient of Dr. Shearon Stalls, seen for initial consult on September 11, 2019 for progressive dyspnea on exertion x6 months.  Patient underwent CT abdomen pelvis which showed incidental pulmonary nodules.  She had a designated CT chest which confirmed pulmonary nodules, unfortunately we did not have the images disks available to review but they did send reports.  Patient is motivated to quit smoking, she was started on Wellbutrin for smoking cessation at last visit.  Plan repeat CT chest in 3 months.  Ordered for full pulmonary function testing.  She was started on Anoro and replacement of Symbicort.  Recommended virtual 4-week follow-up/in office in 5 months with Dr. Shearon Stalls.  04/07/2020 Patient presents today for 6 month follow-up with pulmonary function testing. Reports increased dyspnea. Tried and failed Stiolto and [employer]. States that she did better on Anoro if any. Denies cough or wheezing. She quit smoking for 5 weeks in January, restarted in February when her father-in-law passed away. Zyban helped her quit but she did not continue taking it. She tolerated medication fine. Over due for CT chest to follow multiple pulmonary nodules  04/07/2020 FVC 2.84 (73), FEV1 2.04 (68%), ratio 72, TLC 91%, DLCOcor 15.93 (67%) Curvature on flow volume loop; positive BD response 14% FEV1  Data Reviewed: Imaging: GGO in the RUL which is about 0.8cm in size with right hilar lymph node enlarged to 1.2cm. otherwise no masses or bulky lymphadenopathy.   Allergies  Allergen Reactions  . Ampicillin Hives, Shortness Of Breath and  Swelling    Lips swelling Has patient had a PCN reaction causing immediate rash, facial/tongue/throat swelling, SOB or lightheadedness with hypotension: Yes Has patient had a PCN reaction causing severe rash involving mucus membranes or skin necrosis: No Has patient had a PCN reaction that required hospitalization: No Has patient had a PCN reaction occurring within the last 10 years: Yes If all of the above answers are "NO", then may proceed with Cephalosporin use.   . Carbamazepine     Nausea, headache  . Ciprofloxacin Hives  . Dilantin [Phenytoin Sodium Extended]     nausea  . Sulfamethoxazole Hives    Immunization History  Administered Date(s) Administered  . Influenza,inj,quad, With Preservative 06/18/2019  . Moderna SARS-COVID-2 Vaccination 03/03/2020, 03/31/2020  . Pneumococcal Polysaccharide-23 10/01/2018    Past Medical History:  Diagnosis Date  . Carpal tunnel syndrome, bilateral 08/26/2018  . Chest pain    negative myoview 02/14/2015  . Chronic constipation   . Cirrhosis ([place])   . DDD (degenerative disc disease), lumbar   . Depression   . Fibromyalgia   . Hepatitis C    treated, none since 2016  . Hepatitis C approx. 2004  . History of kidney stones   . Hypertension   . Hypothyroidism   . Liver disease   . Peripheral neuropathy 08/26/2018  . Spleen enlarged   . Thrombocytopenia ([place])   . Vision abnormalities     Tobacco History: Social History   Tobacco Use  Smoking Status Current Every Day Smoker  . Packs/day: 1.00  . Years: 44.00  . Pack years: 44.00  . Types: Cigarettes  Smokeless  Tobacco Never Used  Tobacco Comment   down to 0.5ppd   Ready to quit: Not Answered Counseling given: Not Answered Comment: down to 0.5ppd   Outpatient Medications Prior to Visit  Medication Sig Dispense Refill  . alum & mag hydroxide-simeth (MAALOX/MYLANTA) 200-200-20 MG/5ML suspension Take 15 mLs by mouth every 6 (six) hours as needed for indigestion or heartburn.     Marland Kitchen amLODipine (NORVASC) 5 MG tablet Take 5 mg by mouth daily.    Marland Kitchen aspirin EC 81 MG tablet Take 81 mg by mouth daily.    Marland Kitchen atorvastatin (LIPITOR) 10 MG tablet Take 1 tablet by mouth daily.    Marland Kitchen BELBUCA 150 MCG FILM 450 mcg 2 (two) times daily.   0  . Cholecalciferol (VITAMIN D PO) Take 1,000 Units by mouth daily.    Marland Kitchen gabapentin (NEURONTIN) 300 MG capsule Take 1,200 mg by mouth 3 (three) times daily.     Marland Kitchen Hyoscyamine Sulfate SL (LEVSIN/SL) 0.125 MG SUBL Place 0.125 mg under the tongue 2 (two) times daily as needed (diarrhea, abd pain). 60 tablet 3  . levETIRAcetam (KEPPRA) 250 MG tablet Take 1 tablet (250 mg total) by mouth 2 (two) times daily. 60 tablet 11  . levothyroxine (SYNTHROID) 100 MCG tablet Take 125 mcg by mouth daily before breakfast.     . lisinopril (ZESTRIL) 40 MG tablet Take 40 mg by mouth daily.    . methocarbamol (ROBAXIN) 500 MG tablet Take 500-750 mg by mouth 2 (two) times daily as needed for muscle spasms (takes 1 tablet in the morning and 1.5 tablet at bedtime). Takes a dose every morning and another dose at bedtime only if needed for pain    . metoCLOPramide (REGLAN) 5 MG tablet TAKE ONE TABLET BY MOUTH TWICE DAILY 60 tablet 5  . mirtazapine (REMERON) 15 MG tablet TAKE 1 TABLET BY MOUTH EVERY DAY AT NIGHT  2  . Multiple Vitamin (MULTI-VITAMINS) TABS Take 1 tablet by mouth daily.    . naproxen sodium (ANAPROX) 220 MG tablet Take 440 mg by mouth daily as needed (pain).    . ondansetron (ZOFRAN) 4 MG tablet Take 1 tablet (4 mg total) by mouth every 8 (eight) hours as needed for nausea or vomiting. Patient takes in the morning. 30 tablet 2  . oxyCODONE (OXY IR/ROXICODONE) 5 MG immediate release tablet Take 10 mg by mouth every 8 (eight) hours as needed for moderate pain or severe pain (depends on pain level if takes 1-3 tablets).     . promethazine (PHENERGAN) 25 MG tablet Take 25 mg by mouth every 8 (eight) hours as needed for nausea or vomiting (only takes when zofran does  not work).     . propranolol (INDERAL) 10 MG tablet Take 10 mg by mouth 2 (two) times daily.     . sertraline (ZOLOFT) 100 MG tablet Take 100 mg by mouth daily.     . Tiotropium Bromide-Olodaterol (STIOLTO RESPIMAT) 2.5-2.5 MCG/ACT AERS Inhale 2 puffs into the lungs daily. 4 g 5  . vitamin B-12 (CYANOCOBALAMIN) 500 MCG tablet Take 1,000 mcg by mouth daily.     Marland Kitchen buPROPion (ZYBAN) 150 MG 12 hr tablet TAKE 1 TABLET BY MOUTH TWICE A DAY 60 tablet 0  . dicyclomine (BENTYL) 10 MG capsule Take 1 capsule (10 mg total) by mouth 3 (three) times daily as needed for spasms. 90 capsule 2   No facility-administered medications prior to visit.   Review of Systems  Review of Systems  Constitutional: Negative.  HENT: Negative.   Respiratory: Positive for shortness of breath. Negative for cough and wheezing.   Cardiovascular: Negative.      Physical Exam  BP 132/70 (BP Location: Right Arm, Cuff Size: Normal)   Pulse 64   Temp 98.6 F (37 C) (Oral)   Ht 5\' 9"  (1.753 m)   Wt 231 lb 6.4 oz (105 kg)   SpO2 93%   BMI 34.17 kg/m  Physical Exam Constitutional:      Appearance: Normal appearance.  Cardiovascular:     Rate and Rhythm: Normal rate and regular rhythm.  Pulmonary:     Effort: Pulmonary effort is normal.     Breath sounds: Normal breath sounds.  Neurological:     Mental Status: She is alert.  Psychiatric:        Mood and Affect: Mood normal.        Behavior: Behavior normal.        Thought Content: Thought content normal.        Judgment: Judgment normal.      Lab Results:  CBC    Component Value Date/Time   WBC 5.4 06/05/2018 1256   RBC 4.64 06/05/2018 1256   HGB 13.7 06/05/2018 1256   HCT 39.8 06/05/2018 1256   PLT 110 (L) 06/05/2018 1256   MCV 85.8 06/05/2018 1256   MCH 29.5 06/05/2018 1256   MCHC 34.4 06/05/2018 1256   RDW 13.9 06/05/2018 1256   LYMPHSABS 1,606 06/13/2017 1642   MONOABS 0.7 02/04/2007 1521   EOSABS 248 06/13/2017 1642   BASOSABS 73 06/13/2017  1642    BMET    Component Value Date/Time   NA 146 (H) 04/29/2019 1200   K 4.4 04/29/2019 1200   CL 102 04/29/2019 1200   CO2 28 04/29/2019 1200   GLUCOSE 118 (H) 04/29/2019 1200   GLUCOSE 105 (H) 06/05/2018 1256   BUN 9 04/29/2019 1200   CREATININE 0.67 04/29/2019 1200   CREATININE 0.72 06/05/2018 1256   CALCIUM 9.2 04/29/2019 1200   GFRNONAA 95 04/29/2019 1200   GFRAA 109 04/29/2019 1200    BNP No results found for: BNP  ProBNP No results found for: PROBNP  Imaging: US Abdomen Complete  Result Date: 04/15/2020 CLINICAL DATA:  Hepatoma screening EXAM: ABDOMEN ULTRASOUND COMPLETE COMPARISON:  Abdominal sonogram from 06/19/2018. FINDINGS: Gallbladder: No gallstones or wall thickening visualized. No sonographic Murphy sign noted by sonographer. Common bile duct: Diameter: 3.3 mm Liver: The liver has a nodular contour with diffusely increased parenchymal echogenicity compatible with cirrhosis. Portal vein is patent on color Doppler imaging with normal direction of blood flow towards the liver. IVC: No abnormality visualized. Pancreas: Visualized portion unremarkable. Spleen: Spleen is enlarged measuring 14.9 cm in length with a volume of 747 cc. Right Kidney: Length: 12.4 cm. Upper pole, simple appearing cyst measures 1.4 x 1.0 x 1.5 cm. No solid mass or hydronephrosis. Left Kidney: Length: 11.3 cm. Echogenicity within normal limits. No mass or hydronephrosis visualized. Abdominal aorta: No aneurysm visualized. Other findings: No ascites IMPRESSION: 1. No acute abnormality. 2. Morphologic features of liver compatible with cirrhosis. No focal liver lesion identified to suggest hepatoma. 3. Splenomegaly 4. Simple appearing cyst noted within upper pole of right kidney. Electronically Signed   By: Kerby Moors M.D.   On: 04/15/2020 09:49     Assessment & Plan:   COPD (chronic obstructive pulmonary disease) (HCC) - PFTs showed COPD/asthma overlap  - FEV1 2.04 (68%), RATIO 72 +BD and  curvature on flow volume loop -  No benefit from [employer] or Anoro - Trial Trelegy 100 one puff daily  - Strongly encourage smoking cessation - FU in 3 months with Dr, Shearon Stalls or APP  Pulmonary nodule - CT outside film showed RUL GGO about 0.8cm with right hilar lymph node enlarged - Over due for CT chest to follow multiple pulmonary nodules, if stable then refer to low dose lung cancer screening program  Tobacco use - Restart Zyban 150mg  daily (refill sent) - We will schedule you a visit with our pharmacist to help with smoking cessation       Martyn Ehrich, NP 04/25/2020

## 2020-04-13 ENCOUNTER — Ambulatory Visit (HOSPITAL_COMMUNITY)
Admission: RE | Admit: 2020-04-13 | Discharge: 2020-04-13 | Disposition: A | Discharge status: Home / Self Care | Payer: Medicare Other | Source: Ambulatory Visit | Attending: Gastroenterology | Admitting: Gastroenterology

## 2020-04-13 ENCOUNTER — Other Ambulatory Visit: Payer: Self-pay

## 2020-04-13 DIAGNOSIS — K7460 Unspecified cirrhosis of liver: Secondary | ICD-10-CM | POA: Diagnosis present

## 2020-04-14 ENCOUNTER — Telehealth (INDEPENDENT_AMBULATORY_CARE_PROVIDER_SITE_OTHER): Payer: Self-pay | Admitting: *Deleted

## 2020-04-14 NOTE — Telephone Encounter (Signed)
Patient states that she did get her medication that was written for her at the time of her OV 04/04/2020. I called the [facility] in [place]. Spoke with Koriine. She tells me that is there and not been picked up, then she says no, it is not covered at all by her Insurance.It is a excluded drug. No PA can be done.  I ask the cost for the patient and was told that it wold be $153.99 for the full prescription and it would be 1/2 that amount for the half of the prescription.  Patient was called and this information was left on her voicemail. I told her that I would check with you and see if you had any other recommendations.  Her call back number is [phone].

## 2020-04-14 NOTE — Telephone Encounter (Signed)
Please notify patient that she can use the dicyclomine if Levsin is not covered.  She can increase dicyclomine up to 4 times a day as needed. I do not see where she has return her stool studies.

## 2020-04-15 NOTE — Telephone Encounter (Signed)
Patient was called and voice message left with Janice's recommendation.

## 2020-04-18 ENCOUNTER — Ambulatory Visit (HOSPITAL_COMMUNITY): Payer: Medicare Other

## 2020-04-21 ENCOUNTER — Other Ambulatory Visit: Payer: Medicare Other

## 2020-04-21 ENCOUNTER — Other Ambulatory Visit (INDEPENDENT_AMBULATORY_CARE_PROVIDER_SITE_OTHER): Payer: Self-pay | Admitting: Gastroenterology

## 2020-04-21 DIAGNOSIS — R109 Unspecified abdominal pain: Secondary | ICD-10-CM

## 2020-04-21 MED ORDER — DICYCLOMINE HCL 10 MG PO CAPS: 10.0000 mg | ORAL_CAPSULE | Freq: Four times a day (QID) | ORAL | 11 refills | Status: DC | PRN

## 2020-04-25 DIAGNOSIS — Z720 Tobacco use: Secondary | ICD-10-CM | POA: Insufficient documentation

## 2020-04-25 DIAGNOSIS — J449 Chronic obstructive pulmonary disease, unspecified: Secondary | ICD-10-CM | POA: Insufficient documentation

## 2020-04-25 DIAGNOSIS — R911 Solitary pulmonary nodule: Secondary | ICD-10-CM | POA: Insufficient documentation

## 2020-04-25 NOTE — Assessment & Plan Note (Addendum)
-   PFTs showed COPD/asthma overlap  - FEV1 2.04 (68%), RATIO 72 +BD and curvature on flow volume loop - No benefit from Stiolto or Anoro - Trial Trelegy 100 one puff daily  - Strongly encourage smoking cessation - FU in 3 months with Dr, Shearon Stalls or APP

## 2020-04-25 NOTE — Assessment & Plan Note (Signed)
-   Restart Zyban 150mg  daily (refill sent) - We will schedule you a visit with our pharmacist to help with smoking cessation

## 2020-04-25 NOTE — Assessment & Plan Note (Addendum)
-   CT outside film showed RUL GGO about 0.8cm with right hilar lymph node enlarged - Over due for CT chest to follow multiple pulmonary nodules, if stable then refer to low dose lung cancer screening program

## 2020-04-27 ENCOUNTER — Telehealth: Payer: Self-pay | Admitting: Primary Care

## 2020-04-27 MED ORDER — TRELEGY ELLIPTA 100-62.5-25 MCG/INH IN AEPB: 1.0000 | INHALATION_SPRAY | Freq: Every day | RESPIRATORY_TRACT | 6 refills | Status: DC

## 2020-04-27 NOTE — Telephone Encounter (Signed)
Prescription for Trelegy sent to preferred pharmacy after sample completed.

## 2020-05-10 ENCOUNTER — Ambulatory Visit (HOSPITAL_COMMUNITY): Payer: Medicare Other

## 2020-05-26 ENCOUNTER — Ambulatory Visit (INDEPENDENT_AMBULATORY_CARE_PROVIDER_SITE_OTHER): Payer: Medicare Other | Admitting: Gastroenterology

## 2020-05-29 ENCOUNTER — Other Ambulatory Visit (INDEPENDENT_AMBULATORY_CARE_PROVIDER_SITE_OTHER): Payer: Self-pay | Admitting: Internal Medicine

## 2020-05-29 DIAGNOSIS — R109 Unspecified abdominal pain: Secondary | ICD-10-CM

## 2020-06-18 ENCOUNTER — Other Ambulatory Visit: Payer: Self-pay | Admitting: Neurology

## 2020-06-18 ENCOUNTER — Other Ambulatory Visit: Payer: Self-pay | Admitting: Primary Care

## 2020-06-20 NOTE — Telephone Encounter (Signed)
Yes, ok 

## 2020-06-20 NOTE — Telephone Encounter (Signed)
She can continue if helping her quit. It is recommended for 7-12 weeks. Make sure she has follow-up to assess how she is doing with smoking cessation.

## 2020-06-20 NOTE — Telephone Encounter (Signed)
Attempted to call pt to see if I could get her scheduled for an appt with Dr. Shearon Stalls in October 2021 but unable to reach. Left pt a detailed message for her to contact office to get an appt scheduled with Dr. Shearon Stalls for a follow up .

## 2020-06-20 NOTE — Telephone Encounter (Signed)
Beth, please advise if you are okay refilling med. 

## 2020-06-24 ENCOUNTER — Other Ambulatory Visit: Payer: Self-pay | Admitting: Neurology

## 2020-07-12 ENCOUNTER — Ambulatory Visit: Payer: Medicare Other | Admitting: Internal Medicine

## 2020-07-12 ENCOUNTER — Other Ambulatory Visit: Payer: Self-pay | Admitting: Nurse Practitioner

## 2020-07-12 ENCOUNTER — Other Ambulatory Visit (HOSPITAL_COMMUNITY): Payer: Self-pay | Admitting: Nurse Practitioner

## 2020-07-12 DIAGNOSIS — G8929 Other chronic pain: Secondary | ICD-10-CM

## 2020-07-22 ENCOUNTER — Other Ambulatory Visit: Payer: Self-pay | Admitting: Neurology

## 2020-08-03 ENCOUNTER — Other Ambulatory Visit: Payer: Self-pay | Admitting: Primary Care

## 2020-08-09 ENCOUNTER — Ambulatory Visit: Payer: Medicare Other | Admitting: Internal Medicine

## 2020-08-09 ENCOUNTER — Other Ambulatory Visit: Payer: Self-pay | Admitting: Neurology

## 2020-08-13 ENCOUNTER — Other Ambulatory Visit (INDEPENDENT_AMBULATORY_CARE_PROVIDER_SITE_OTHER): Payer: Self-pay | Admitting: Gastroenterology

## 2020-08-13 DIAGNOSIS — R109 Unspecified abdominal pain: Secondary | ICD-10-CM

## 2020-08-15 NOTE — Telephone Encounter (Signed)
Change in BM for [place] on 04/04/2020

## 2020-08-15 NOTE — Telephone Encounter (Signed)
I received a refill request for dicyclomine but also has levsin on list - should not use both together. Can you check with her if needs refill of dicyclomine? Thanks.

## 2020-08-17 ENCOUNTER — Other Ambulatory Visit (INDEPENDENT_AMBULATORY_CARE_PROVIDER_SITE_OTHER): Payer: Self-pay

## 2020-08-17 DIAGNOSIS — R109 Unspecified abdominal pain: Secondary | ICD-10-CM

## 2020-08-17 MED ORDER — DICYCLOMINE HCL 10 MG PO CAPS: ORAL_CAPSULE | ORAL | 0 refills | Status: DC

## 2020-08-19 ENCOUNTER — Ambulatory Visit (HOSPITAL_COMMUNITY): Payer: Medicare Other

## 2020-08-22 NOTE — Telephone Encounter (Signed)
Patient returned your call.

## 2020-08-22 NOTE — Telephone Encounter (Signed)
Spoke with the patient she states she was not on both and has picked up Dicyclomine 10 mg from pharmacy.

## 2020-08-26 ENCOUNTER — Telehealth (INDEPENDENT_AMBULATORY_CARE_PROVIDER_SITE_OTHER): Payer: Self-pay | Admitting: *Deleted

## 2020-08-26 NOTE — Telephone Encounter (Signed)
Talked with the patient. She states that for the last 3 and half weeks she has had severe constipation and this increase the nausea. She has taken all her medications as directed. Most recently she has had to use enemas and Ex Lax to get any relief.  Dicussed with Dr.Rehman and he ask that the patient be given appointment sooner than March or April to reevaluate the patient.

## 2020-08-31 ENCOUNTER — Ambulatory Visit (HOSPITAL_COMMUNITY): Payer: Medicare Other

## 2020-09-05 IMAGING — US US ABDOMEN COMPLETE
1 series · 13 of 25 positions shown · non-contrast
Comparison: Abdominal sonogram from 06/19/2018.

CLINICAL DATA: Hepatoma screening

EXAM:
ABDOMEN ULTRASOUND COMPLETE

[Series 1: us abdomen complete · 13 of 121 slices shown]
[im 1/121]
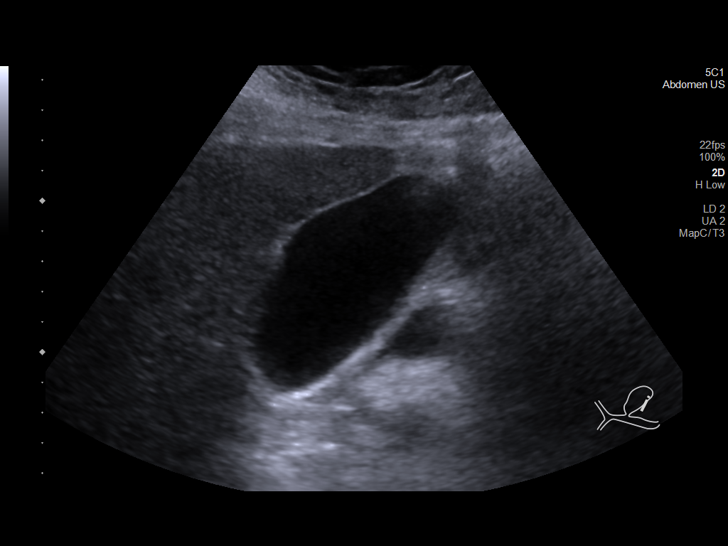
[im 11/121]
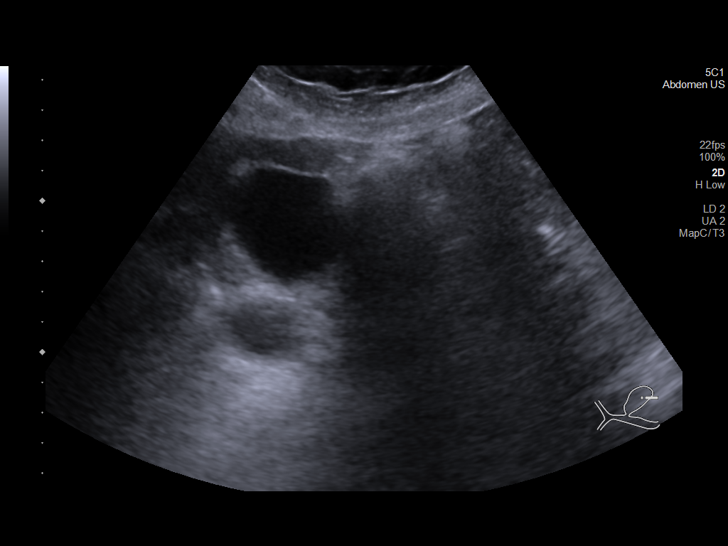
[im 21/121]
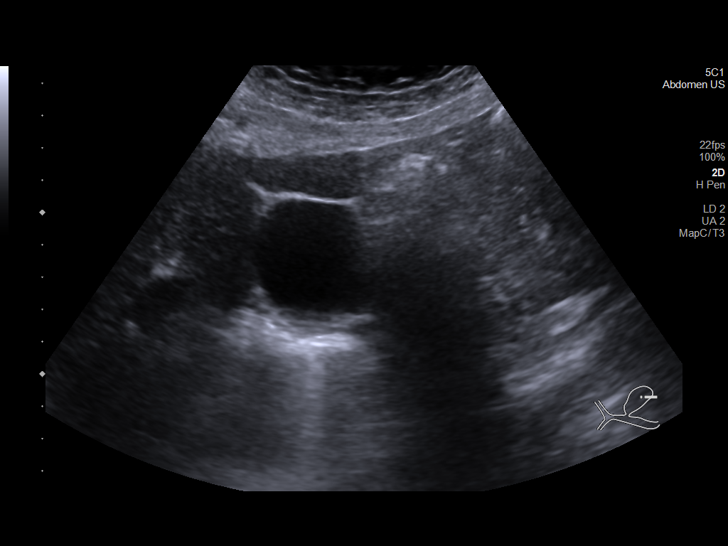
[im 31/121]
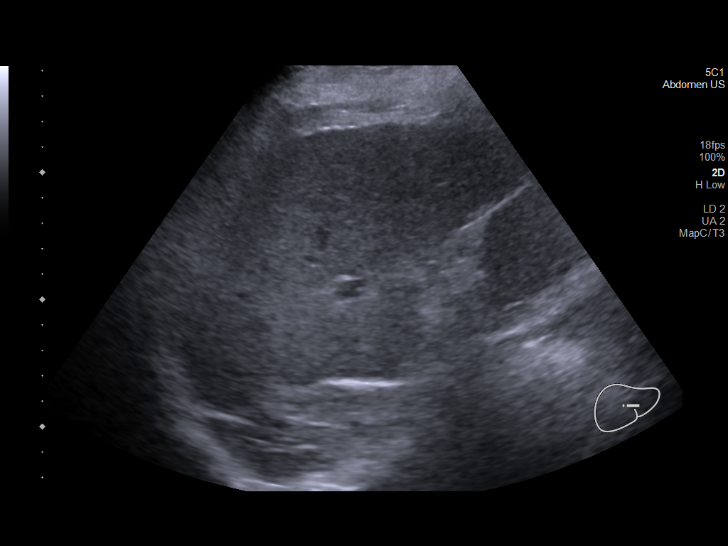
[im 41/121]
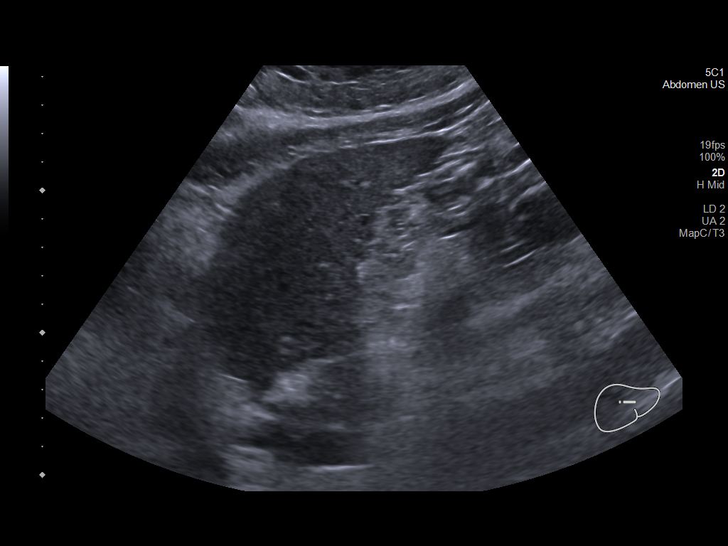
[im 51/121]
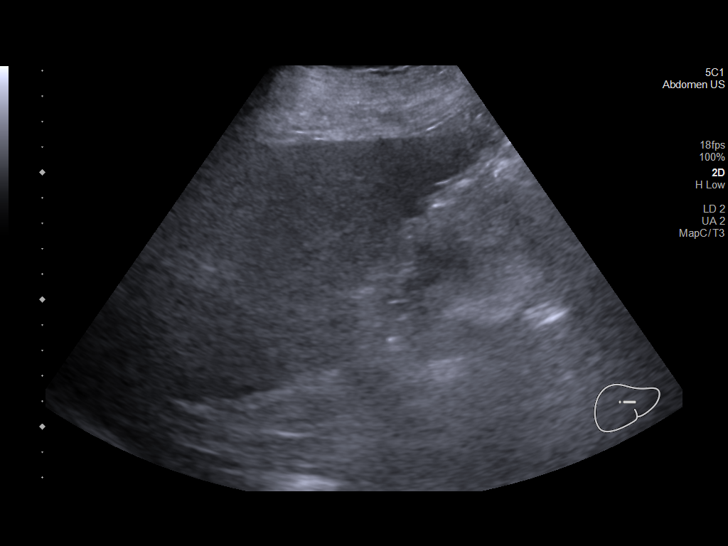
[im 61/121]
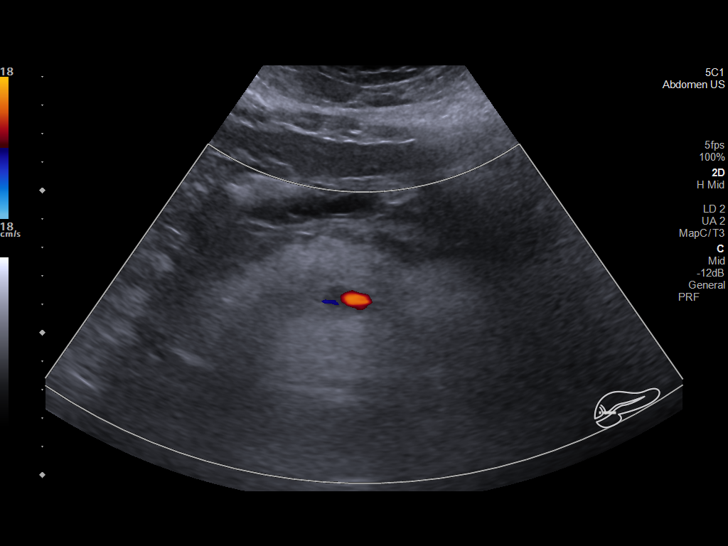
[im 71/121]
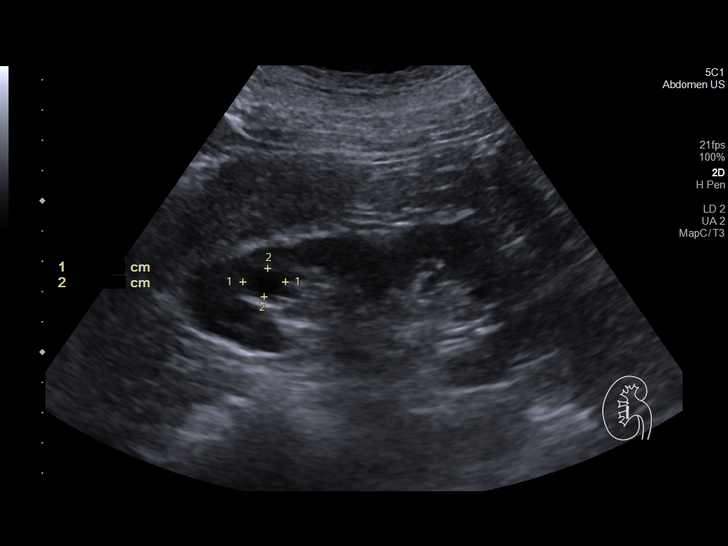
[im 81/121]
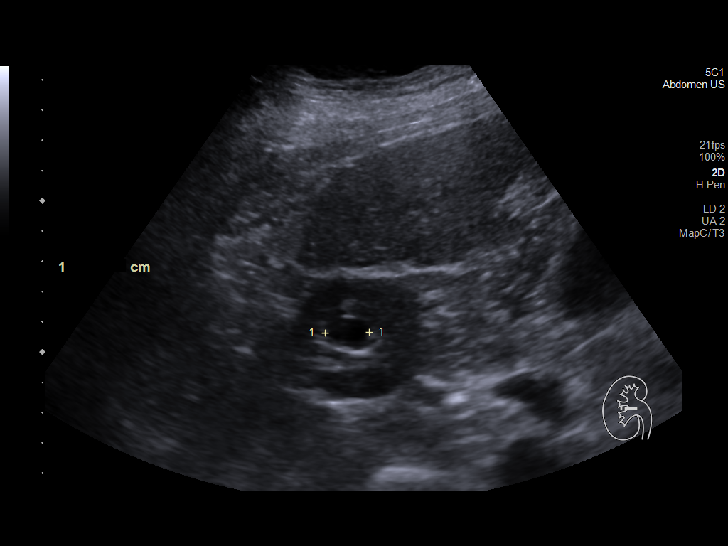
[im 91/121]
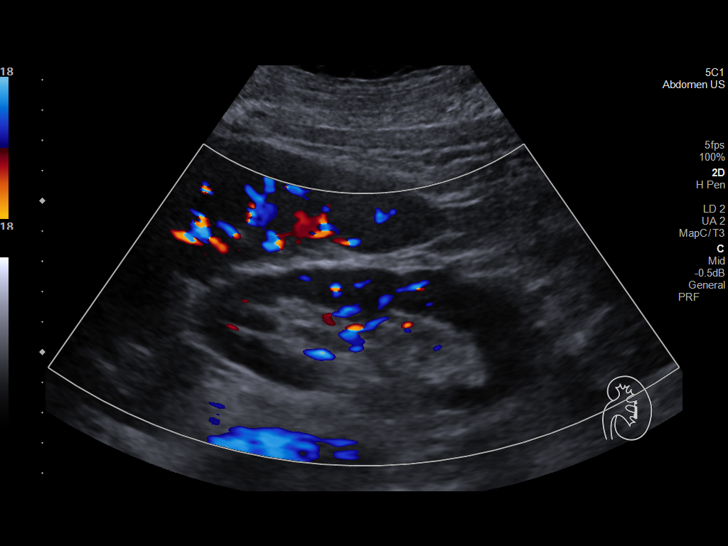
[im 101/121]
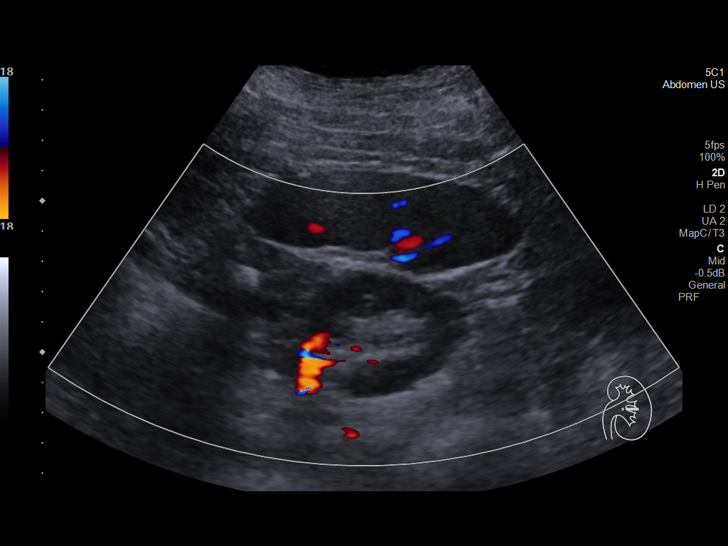
[im 111/121]
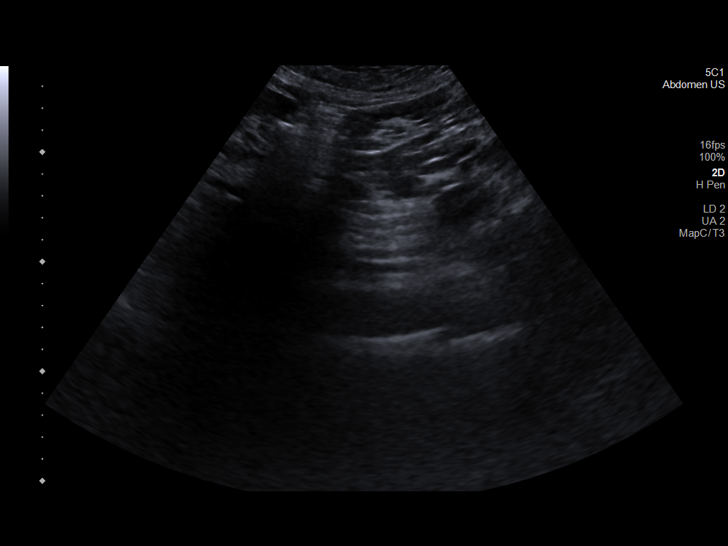
[im 121/121]
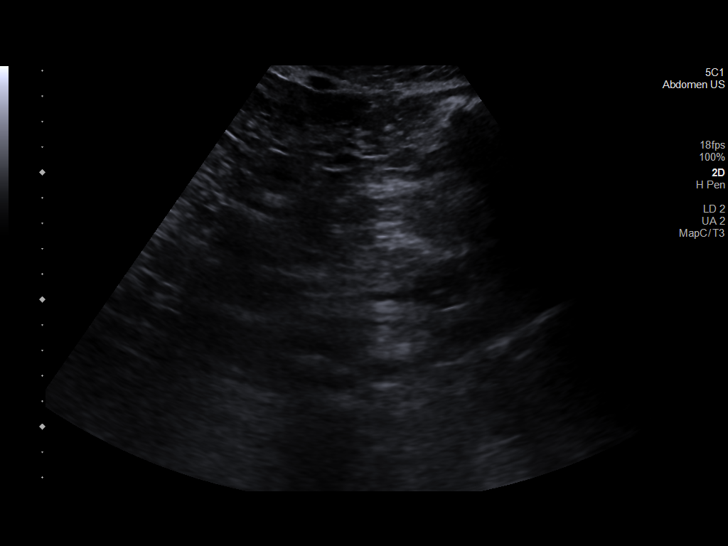

[13 of 25 positions shown; findings below may reference images not displayed]

FINDINGS: Gallbladder: No gallstones or wall thickening visualized. No
sonographic Murphy sign noted by sonographer.

Common bile duct: Diameter: 3.3 mm

Liver: The liver has a nodular contour with diffusely increased
parenchymal echogenicity compatible with cirrhosis. Portal vein is
patent on color Doppler imaging with normal direction of blood flow
towards the liver.

IVC: No abnormality visualized.

Pancreas: Visualized portion unremarkable.

Spleen: Spleen is enlarged measuring 14.9 cm in length with a volume
of 747 cc.

Right Kidney: Length: 12.4 cm. Upper pole, simple appearing cyst
measures 1.4 x 1.0 x 1.5 cm. No solid mass or hydronephrosis.

Left Kidney: Length: 11.3 cm. Echogenicity within normal limits. No
mass or hydronephrosis visualized.

Abdominal aorta: No aneurysm visualized.

Other findings: No ascites
IMPRESSION: 1. No acute abnormality.
2. Morphologic features of liver compatible with cirrhosis. No focal
liver lesion identified to suggest hepatoma.
3. Splenomegaly
4. Simple appearing cyst noted within upper pole of right kidney.

## 2020-09-06 ENCOUNTER — Other Ambulatory Visit: Payer: Self-pay | Admitting: Neurology

## 2020-09-06 ENCOUNTER — Ambulatory Visit: Payer: Medicare Other | Admitting: Internal Medicine

## 2020-09-08 ENCOUNTER — Other Ambulatory Visit: Payer: Self-pay

## 2020-09-08 ENCOUNTER — Telehealth (INDEPENDENT_AMBULATORY_CARE_PROVIDER_SITE_OTHER): Payer: Self-pay

## 2020-09-08 ENCOUNTER — Ambulatory Visit (INDEPENDENT_AMBULATORY_CARE_PROVIDER_SITE_OTHER): Payer: Medicare Other | Admitting: Internal Medicine

## 2020-09-08 ENCOUNTER — Encounter (INDEPENDENT_AMBULATORY_CARE_PROVIDER_SITE_OTHER): Payer: Self-pay | Admitting: Internal Medicine

## 2020-09-08 VITALS — BP 170/95 | HR 79 | Temp 98.9°F | Ht 71.5 in | Wt 214.7 lb

## 2020-09-08 DIAGNOSIS — R112 Nausea with vomiting, unspecified: Secondary | ICD-10-CM

## 2020-09-08 DIAGNOSIS — R634 Abnormal weight loss: Secondary | ICD-10-CM | POA: Diagnosis not present

## 2020-09-08 DIAGNOSIS — R1033 Periumbilical pain: Secondary | ICD-10-CM | POA: Diagnosis not present

## 2020-09-08 DIAGNOSIS — K5909 Other constipation: Secondary | ICD-10-CM | POA: Diagnosis not present

## 2020-09-08 DIAGNOSIS — R110 Nausea: Secondary | ICD-10-CM

## 2020-09-08 DIAGNOSIS — R1012 Left upper quadrant pain: Secondary | ICD-10-CM

## 2020-09-08 MED ORDER — ONDANSETRON HCL 4 MG PO TABS: 4.0000 mg | ORAL_TABLET | Freq: Three times a day (TID) | ORAL | 2 refills | Status: DC | PRN

## 2020-09-08 MED ORDER — NALOXEGOL OXALATE 12.5 MG PO TABS: 12.5000 mg | ORAL_TABLET | Freq: Every day | ORAL | 2 refills | Status: DC

## 2020-09-08 MED ORDER — BENEFIBER DRINK MIX PO PACK: 4.0000 g | PACK | Freq: Every day | ORAL | Status: DC

## 2020-09-08 NOTE — Telephone Encounter (Signed)
Teresa Benitez, CMA  

## 2020-09-08 NOTE — Progress Notes (Signed)
Presenting complaint;  Nausea constipation abdominal pain and weight loss. History of cirrhosis.  Database and subjective:  Patient is 64 year old Caucasian female with multiple medical problems who called office week before last and requested to be seen. Her last visit to our office was in December 2020. She has cirrhosis, chronic left upper quadrant abdominal pain IBS and constipation and chronic nausea. Patient says she has been feeling poorly for the last 6 weeks.  She has noted worsening constipation and her bowels will not move with polyethylene glycol Dulcolax or Fleet enemas.  She she has been taking Ex-Lax at least twice a week and seems to help.  Nausea is constant and never associated with vomiting.  She has tried to induce vomiting but nothing comes out.  She has very poor appetite and she has lost 14 to 16 pounds in the last 6 weeks.  She says nausea is much worse when she goes without a bowel movement for few days.  She does get some some but incomplete relief with a bowel movement.  Ondansetron seem to help.  She has tried Phenergan and metoclopramide.  She also complains of abdominal pain which she describes as spasm which is in epigastric region left upper quadrant as well as mid abdomen.  She feels dicyclomine is helping which she uses on as-needed basis.  She was seen by Dr. Huel Cote recently.  She has history of peptic ulcer disease.  Therefore she had H. pylori breath test on 08/30/2020 and was within normal limits.  She feels tired and is spending a lot more time in bed. She states her pain medication has not changed.  She is actually taking less oxycodone than she has in the past.  She says her TSH was 4.3 within normal limits about 6 months ago.  She does not take Aleve very often.  She may take 1 or 2 doses in a month primarily for headache. She has taken Linzess in the past but she developed explosive diarrhea even with low-dose. She has chronic pain including peripheral  neuropathy and is under care at [facility]. She has not had any alcohol in over 15 years.  Current Medications: Outpatient Encounter Medications as of 09/08/2020  Medication Sig  . alum & mag hydroxide-simeth (MAALOX/MYLANTA) 200-200-20 MG/5ML suspension Take 15 mLs by mouth every 6 (six) hours as needed for indigestion or heartburn.  Marland Kitchen amLODipine (NORVASC) 5 MG tablet Take 5 mg by mouth daily.  Marland Kitchen aspirin EC 81 MG tablet Take 81 mg by mouth daily.  Marland Kitchen atorvastatin (LIPITOR) 10 MG tablet Take 1 tablet by mouth daily.  Marland Kitchen BELBUCA 150 MCG FILM 450 mcg 2 (two) times daily.   Marland Kitchen buPROPion (ZYBAN) 150 MG 12 hr tablet TAKE 1 TABLET BY MOUTH TWICE A DAY  . Cholecalciferol (VITAMIN D PO) Take 1,000 Units by mouth daily.  Marland Kitchen dicyclomine (BENTYL) 10 MG capsule TAKE 1 CAPSULE BY MOUTH THREE TIMES DAILY AS NEEDED FOR  SPASMS  . Fluticasone-Umeclidin-Vilant (TRELEGY ELLIPTA) 100-62.5-25 MCG/INH AEPB Inhale 1 puff into the lungs daily.  Marland Kitchen gabapentin (NEURONTIN) 300 MG capsule Take 1,200 mg by mouth 3 (three) times daily.   Marland Kitchen levothyroxine (SYNTHROID) 100 MCG tablet Take 125 mcg by mouth daily before breakfast.   . lisinopril (ZESTRIL) 40 MG tablet Take 40 mg by mouth daily.  . methocarbamol (ROBAXIN) 500 MG tablet Take 500-750 mg by mouth 2 (two) times daily as needed for muscle spasms (takes 1 tablet in the morning and 1.5 tablet  at bedtime). Takes a dose every morning and another dose at bedtime only if needed for pain  . metoCLOPramide (REGLAN) 5 MG tablet TAKE ONE TABLET BY MOUTH TWICE DAILY  . mirtazapine (REMERON) 15 MG tablet TAKE 1 TABLET BY MOUTH EVERY DAY AT NIGHT  . Multiple Vitamin (MULTI-VITAMINS) TABS Take 1 tablet by mouth daily.  . naproxen sodium (ANAPROX) 220 MG tablet Take 440 mg by mouth daily as needed (pain).  . ondansetron (ZOFRAN) 4 MG tablet Take 1 tablet (4 mg total) by mouth every 8 (eight) hours as needed for nausea or vomiting. Patient takes in the morning.  Marland Kitchen  oxyCODONE (OXY IR/ROXICODONE) 5 MG immediate release tablet Take 10 mg by mouth every 8 (eight) hours as needed for moderate pain or severe pain (depends on pain level if takes 1-3 tablets).   . propranolol (INDERAL) 10 MG tablet Take 10 mg by mouth 2 (two) times daily.   . Tiotropium Bromide-Olodaterol (STIOLTO RESPIMAT) 2.5-2.5 MCG/ACT AERS Inhale 2 puffs into the lungs daily.  . vitamin B-12 (CYANOCOBALAMIN) 500 MCG tablet Take 1,000 mcg by mouth daily.   Marland Kitchen levETIRAcetam (KEPPRA) 250 MG tablet TAKE 1 TABLET BY MOUTH TWICE A DAY (Patient not taking: Reported on 09/08/2020)  . promethazine (PHENERGAN) 25 MG tablet Take 25 mg by mouth every 8 (eight) hours as needed for nausea or vomiting (only takes when zofran does not work).  (Patient not taking: Reported on 09/08/2020)  . sertraline (ZOLOFT) 100 MG tablet Take 100 mg by mouth daily.  (Patient not taking: Reported on 09/08/2020)   No facility-administered encounter medications on file as of 09/08/2020.   Past Medical History:  Diagnosis Date  . Carpal tunnel syndrome, bilateral 08/26/2018  . Chest pain    negative myoview 02/14/2015  . Chronic constipation   . Cirrhosis ([place])   . DDD (degenerative disc disease), lumbar   . Depression   . Fibromyalgia   . Hepatitis C    SVR with therapy in 2016 2016  . H approx. 2004  . History of kidney stones   . Hypertension   . Hypothyroidism   .    Marland Kitchen Peripheral neuropathy 08/26/2018  . Spleen enlarged   . 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 Vision abnormalities    Past Surgical History:  Procedure Laterality Date  . CARDIAC CATHETERIZATION     with stent  . CESAREAN SECTION  1996  . COLONOSCOPY  5-6 years ago    Done In [place]  . ESOPHAGOGASTRODUODENOSCOPY (EGD) WITH PROPOFOL N/A 08/23/2017   Procedure: ESOPHAGOGASTRODUODENOSCOPY (EGD) WITH PROPOFOL;  Surgeon: Rogene Houston, MD;  Location: [facility];  Service: Endoscopy;  Laterality: N/A;  . Para Thyhoid  2005  .  PARTIAL HYSTERECTOMY     Patient states that she had 16 years ago?1998  . UPPER GASTROINTESTINAL ENDOSCOPY     Allergies  Allergies  Allergen Reactions  . Ampicillin Hives, Shortness Of Breath and Swelling    Lips swelling Has patient had a PCN reaction causing immediate rash, facial/tongue/throat swelling, SOB or lightheadedness with hypotension: Yes Has patient had a PCN reaction causing severe rash involving mucus membranes or skin necrosis: No Has patient had a PCN reaction that required hospitalization: No Has patient had a PCN reaction occurring within the last 10 years: Yes If all of the above answers are "NO", then may proceed with Cephalosporin use.   . Carbamazepine     Nausea, headache  . Ciprofloxacin Hives  . Dilantin [Phenytoin  Sodium Extended]     nausea  . Sulfamethoxazole Hives      Objective: Blood pressure (!) 170/95, pulse 79, temperature 98.9 F (37.2 C), temperature source Oral, height 5' 11.5" (1.816 m), weight 214 lb 11.2 oz (97.4 kg). Patient is alert and in no acute distress. She is wearing a mask. She does not have asterixis. Conjunctiva is pink. Sclera is nonicteric Oropharyngeal mucosa is normal. No neck masses or thyromegaly noted. Cardiac exam with regular rhythm normal S1 and S2.  Faint systolic murmur best heard at aortic area. Lungs are clear to auscultation. Abdomen is full.  No bruit noted.  On palpation abdomen is soft with mild to moderate tenderness in left upper quadrant and periumbilical region.  Tenderness is most pronounced to left of umbilicus.  No hepatosplenomegaly. No LE edema or clubbing noted.  Labs/studies Results:  Abdominal ultrasound in July 2021 revealed liver contour changes consistent with cirrhosis but no focal abnormalities.  Splenomegaly but no ascites.  Lab data from 03/15/2020 WBC 6.8 H&H 13.0 and 40.2 Platelet count 129K  Glucose 253 BUN 14 and creatinine 0.79 Sodium 141, potassium 4.1, chloride 104, CO2  31.5 Serum calcium 9.0  Bilirubin 0.6, AP 138, AST 28, ALT 20, total protein 7.6 with albumin of 3.1.   H. pylori breath test negative on 08/30/2020(Dr. Alethea Rucker)   Assessment:  #1.  Acute on chronic nausea.  Nausea worsened 6 weeks ago.  Nausea is not associated with vomiting but she has developed anorexia and weight loss.  She has lost 16 pounds since her last visit a year ago but most of the weight loss appears to have occurred in the last 6 weeks.  Differential diagnosis is quite broad and includes nausea secondary to medications peptic ulcer disease or she could have another abdominal process given worsening abdominal pain.  She has history of gastric ulcer with she had follow-up EGD to document complete healing.  She certainly could have recurrent disease. As ondansetron seem to provide symptomatic relief I would recommend she should not use promethazine as the risk of confusion and other side effects significant given that she is on neurotropic and psychotropic drugs.  #2.  Constipation.  She is not getting any relief with polyethylene glycol Fleet enemas and/or Dulcolax and is having to use stimulant laxative.  Constipation is felt to be primarily opioid induced.  She is up-to-date on CRC schedule.  She had colonoscopy in January 2017 [facility] with removal of single polyp and next exam due in January 2022.  #3.  Abdominal pain.  She has history of chronic left upper quadrant abdominal pain felt to be neuropathic.  Pain now seem to involve periumbilical as well as epigastric region and she appears to be getting some relief with as needed dicyclomine.  With intractable nausea and weight loss need to rule out pancreatitis or other abnormalities.  #4.  Cirrhosis.  She has cirrhosis secondary to chronic hepatitis C successfully treated 5 years ago with SVR.  Other risk factors include fatty liver.  She has preserved hepatic function but she does have stigmata of portal hypertension such as  splenomegaly.  She also has thrombocytopenia.  Ultrasound in July did not reveal any focal abnormalities.   Plan:  Patient advised not to take metoclopramide or promethazine. She will use ondansetron for nausea.  New prescription sent to patient's pharmacy.  Patient advised to take first dose 30 minutes before breakfast and subsequent doses on as-needed basis. Patient will go to the lab  for CBC, comprehensive chemistry panel and serum lipase. Hemoccult x1. Begin pantoprazole 40 mg p.o. every morning. Movantik/naloxegol 12.5 mg p.o. every morning.  Patient informed about potential narcotic withdrawal symptoms.  If she has significant symptoms she will stop the medication and call office. Dulcolax suppository every other day or an as-needed basis. Abdominopelvic CT with contrast. Office visit in 2 months.

## 2020-09-08 NOTE — Patient Instructions (Signed)
Take ondansetron every morning 30 minutes before breakfast and subsequent doses as needed. Use Dulcolax suppository every other day on as-needed basis. If you experience side effects with Movantik please stop this medication and call office Patient will call with results of blood test and CT. Do not take metoclopramide or promethazine. Hemoccult x1.

## 2020-09-09 MED ORDER — PANTOPRAZOLE SODIUM 40 MG PO TBEC: 40.0000 mg | DELAYED_RELEASE_TABLET | Freq: Every day | ORAL | 5 refills | Status: DC

## 2020-09-10 LAB — CBC WITH DIFFERENTIAL/PLATELET
Absolute Monocytes: 406 cells/uL (ref 200–950)
Basophils Absolute: 41 cells/uL (ref 0–200)
Basophils Relative: 0.7 %
Eosinophils Absolute: 151 cells/uL (ref 15–500)
Eosinophils Relative: 2.6 %
HCT: 42.7 % (ref 35.0–45.0)
Hemoglobin: 14.5 g/dL (ref 11.7–15.5)
Lymphs Abs: 1740 cells/uL (ref 850–3900)
MCH: 29.9 pg (ref 27.0–33.0)
MCHC: 34 g/dL (ref 32.0–36.0)
MCV: 88 fL (ref 80.0–100.0)
MPV: 11 fL (ref 7.5–12.5)
Monocytes Relative: 7 %
Neutro Abs: 3463 cells/uL (ref 1500–7800)
Neutrophils Relative %: 59.7 %
Platelets: 125 10*3/uL — ABNORMAL LOW (ref 140–400)
RBC: 4.85 10*6/uL (ref 3.80–5.10)
RDW: 13.5 % (ref 11.0–15.0)
Total Lymphocyte: 30 %
WBC: 5.8 10*3/uL (ref 3.8–10.8)

## 2020-09-10 LAB — COMPREHENSIVE METABOLIC PANEL
AG Ratio: 1.3 (calc) (ref 1.0–2.5)
ALT: 17 U/L (ref 6–29)
AST: 28 U/L (ref 10–35)
Albumin: 3.8 g/dL (ref 3.6–5.1)
Alkaline phosphatase (APISO): 100 U/L (ref 37–153)
BUN: 10 mg/dL (ref 7–25)
CO2: 32 mmol/L (ref 20–32)
Calcium: 9.6 mg/dL (ref 8.6–10.4)
Chloride: 103 mmol/L (ref 98–110)
Creat: 0.79 mg/dL (ref 0.50–0.99)
Globulin: 3 g/dL (calc) (ref 1.9–3.7)
Glucose, Bld: 144 mg/dL — ABNORMAL HIGH (ref 65–139)
Potassium: 4.5 mmol/L (ref 3.5–5.3)
Sodium: 140 mmol/L (ref 135–146)
Total Bilirubin: 0.8 mg/dL (ref 0.2–1.2)
Total Protein: 6.8 g/dL (ref 6.1–8.1)

## 2020-09-10 LAB — LIPASE: Lipase: 42 U/L (ref 7–60)

## 2020-09-12 ENCOUNTER — Ambulatory Visit (HOSPITAL_COMMUNITY): Payer: Medicare Other

## 2020-09-13 ENCOUNTER — Telehealth (INDEPENDENT_AMBULATORY_CARE_PROVIDER_SITE_OTHER): Payer: Self-pay | Admitting: *Deleted

## 2020-09-13 NOTE — Telephone Encounter (Signed)
Patient left a voicemail. She says that she has been taking the medications that Dr.Rehman prescribed faithfully. She has been taking the Bene fiber twice a day , and using the Ducolax Suppositories.  Patient has had no results and she is now 9 days without having a bowel movement. The nausea is overwhelming.  Patient was unable to have CT because she could not drink the contrast. The CT has been posted for 10/11/2020.  She is asking what are the next steps to help with having BM?  [phone].

## 2020-09-14 NOTE — Telephone Encounter (Signed)
Per Dr.Rehman the patient may try the 1 gallon of the Golytely. Patient is to drink 1- 8 oz glass every 15 minutes until gone. This was called to [facility] in [place] , [place].  He also advised that the patient should talk with her pain management doctor about evaluating/decreasing some of her other medications.  Patient was made aware.

## 2020-09-20 ENCOUNTER — Encounter (HOSPITAL_COMMUNITY): Payer: Self-pay

## 2020-09-20 ENCOUNTER — Ambulatory Visit (INDEPENDENT_AMBULATORY_CARE_PROVIDER_SITE_OTHER): Payer: Medicare Other | Admitting: Internal Medicine

## 2020-09-20 ENCOUNTER — Other Ambulatory Visit: Payer: Self-pay

## 2020-09-20 ENCOUNTER — Ambulatory Visit (HOSPITAL_COMMUNITY)
Admission: RE | Admit: 2020-09-20 | Discharge: 2020-09-20 | Disposition: A | Discharge status: Home / Self Care | Payer: Medicare Other | Source: Ambulatory Visit | Attending: Nurse Practitioner | Admitting: Nurse Practitioner

## 2020-09-20 ENCOUNTER — Ambulatory Visit (HOSPITAL_COMMUNITY): Payer: Medicare Other

## 2020-09-20 DIAGNOSIS — G8929 Other chronic pain: Secondary | ICD-10-CM | POA: Diagnosis present

## 2020-09-20 DIAGNOSIS — M545 Low back pain, unspecified: Secondary | ICD-10-CM | POA: Insufficient documentation

## 2020-09-30 ENCOUNTER — Telehealth (INDEPENDENT_AMBULATORY_CARE_PROVIDER_SITE_OTHER): Payer: Self-pay | Admitting: *Deleted

## 2020-09-30 NOTE — Telephone Encounter (Signed)
Patient called the office Thursday afternoon. She left the following voicemail.  Still having the Nausea and Constipation. She had been uping her game and eating more fruits and vegetables. In order to have a bowel movement she is having to take Ex lax and or Ducolax in order to have one. She was unable to celebrate Christmas and New year because of how bad she was feeling. She is still taking the Nausea Medications and the IBS Medication.  She is asking what else can be done. She has weighed and she is down 2 more pounds. I do not think that the patient has has the imaging study because she was unable to tolerate/drink the contrast.  [phone].

## 2020-10-03 NOTE — Telephone Encounter (Signed)
Still waiting for CT to be done. I would recommend that she use Dulcolax suppository or fleets enema every day and try not to take stimulant laxatives as it would make her conditions worse.

## 2020-10-03 NOTE — Telephone Encounter (Signed)
Patient was called and left a voice message with Dr.Rehman's recommendation. I ask that she call the office back to have the diagnostic rescheduled. Patient had reported that she was unable to drink the contrast as she had a history of nausea and the contrast made it worse. If she had further concerns and or questions she could call the office or use MyChart.

## 2020-10-05 ENCOUNTER — Other Ambulatory Visit: Payer: Self-pay

## 2020-10-05 ENCOUNTER — Ambulatory Visit (HOSPITAL_COMMUNITY)
Admission: RE | Admit: 2020-10-05 | Discharge: 2020-10-05 | Disposition: A | Discharge status: Home / Self Care | Payer: Medicare HMO | Source: Ambulatory Visit | Attending: Internal Medicine | Admitting: Internal Medicine

## 2020-10-05 DIAGNOSIS — R1012 Left upper quadrant pain: Secondary | ICD-10-CM | POA: Insufficient documentation

## 2020-10-05 DIAGNOSIS — R112 Nausea with vomiting, unspecified: Secondary | ICD-10-CM | POA: Insufficient documentation

## 2020-10-05 DIAGNOSIS — G8929 Other chronic pain: Secondary | ICD-10-CM | POA: Diagnosis present

## 2020-10-05 DIAGNOSIS — R110 Nausea: Secondary | ICD-10-CM | POA: Insufficient documentation

## 2020-10-05 MED ORDER — IOHEXOL 300 MG/ML  SOLN
100.0000 mL | Freq: Once | INTRAMUSCULAR | Status: AC | PRN
  Administered 2020-10-05: 100 mL via INTRAVENOUS

## 2020-10-12 ENCOUNTER — Ambulatory Visit: Payer: Medicare Other | Admitting: Internal Medicine

## 2020-10-17 ENCOUNTER — Other Ambulatory Visit (INDEPENDENT_AMBULATORY_CARE_PROVIDER_SITE_OTHER): Payer: Self-pay

## 2020-10-17 DIAGNOSIS — K7460 Unspecified cirrhosis of liver: Secondary | ICD-10-CM

## 2020-10-19 NOTE — OR Nursing (Signed)
Patient called and stated she wants to cancel her EGD for 10/27/2020. Left a message for Darius Bump at Dr. Olevia Perches office.

## 2020-10-25 ENCOUNTER — Other Ambulatory Visit (HOSPITAL_COMMUNITY): Admission: RE | Admit: 2020-10-25 | Payer: Medicare HMO | Source: Ambulatory Visit

## 2020-10-27 ENCOUNTER — Encounter (HOSPITAL_COMMUNITY): Admission: RE | Payer: Self-pay | Source: Home / Self Care

## 2020-10-27 ENCOUNTER — Ambulatory Visit (HOSPITAL_COMMUNITY): Admission: RE | Admit: 2020-10-27 | Payer: Medicare HMO | Source: Home / Self Care | Admitting: Internal Medicine

## 2020-10-27 SURGERY — EGD (ESOPHAGOGASTRODUODENOSCOPY)
Anesthesia: Moderate Sedation

## 2020-11-10 ENCOUNTER — Other Ambulatory Visit: Payer: Self-pay | Admitting: Primary Care

## 2020-11-21 ENCOUNTER — Ambulatory Visit (INDEPENDENT_AMBULATORY_CARE_PROVIDER_SITE_OTHER): Payer: Medicare Other | Admitting: Gastroenterology

## 2020-12-20 ENCOUNTER — Ambulatory Visit (INDEPENDENT_AMBULATORY_CARE_PROVIDER_SITE_OTHER): Payer: Medicare Other | Admitting: Internal Medicine

## 2021-01-08 ENCOUNTER — Other Ambulatory Visit (INDEPENDENT_AMBULATORY_CARE_PROVIDER_SITE_OTHER): Payer: Self-pay | Admitting: Internal Medicine

## 2021-05-15 ENCOUNTER — Ambulatory Visit (INDEPENDENT_AMBULATORY_CARE_PROVIDER_SITE_OTHER): Payer: Medicare HMO | Admitting: Gastroenterology

## 2021-05-18 ENCOUNTER — Other Ambulatory Visit: Payer: Self-pay

## 2021-05-18 ENCOUNTER — Encounter (INDEPENDENT_AMBULATORY_CARE_PROVIDER_SITE_OTHER): Payer: Self-pay | Admitting: Gastroenterology

## 2021-05-18 ENCOUNTER — Ambulatory Visit (INDEPENDENT_AMBULATORY_CARE_PROVIDER_SITE_OTHER): Payer: Medicare HMO | Admitting: Gastroenterology

## 2021-05-18 VITALS — BP 155/82 | HR 69 | Temp 98.6°F | Ht 71.5 in | Wt 231.0 lb

## 2021-05-18 DIAGNOSIS — K5900 Constipation, unspecified: Secondary | ICD-10-CM

## 2021-05-18 DIAGNOSIS — K7460 Unspecified cirrhosis of liver: Secondary | ICD-10-CM

## 2021-05-18 DIAGNOSIS — R110 Nausea: Secondary | ICD-10-CM

## 2021-05-18 MED ORDER — NALOXEGOL OXALATE 12.5 MG PO TABS: 12.5000 mg | ORAL_TABLET | Freq: Every day | ORAL | 3 refills | Status: DC

## 2021-05-18 MED ORDER — ONDANSETRON HCL 4 MG PO TABS: 4.0000 mg | ORAL_TABLET | Freq: Three times a day (TID) | ORAL | 1 refills | Status: DC | PRN

## 2021-05-18 MED ORDER — PROMETHAZINE HCL 25 MG PO TABS: 25.0000 mg | ORAL_TABLET | Freq: Three times a day (TID) | ORAL | 0 refills | Status: DC | PRN

## 2021-05-18 MED ORDER — PEG 3350-KCL-NA BICARB-NACL 420 G PO SOLR: 4000.0000 mL | Freq: Once | ORAL | 0 refills | Status: AC

## 2021-05-18 MED ORDER — HYOSCYAMINE SULFATE 0.125 MG SL SUBL: 0.1250 mg | SUBLINGUAL_TABLET | Freq: Four times a day (QID) | SUBLINGUAL | 1 refills | Status: DC | PRN

## 2021-05-18 NOTE — Patient Instructions (Addendum)
we will go ahead and proceed with a bowel prep to clean you out then you may restart movantik. -we will try to get labs from your PCP, we may need to order further labs depending on what they checked. -we will get you scheduled for repeat colonoscopy/egd   Refills of zofran, movantik and phenergan sent.  We will start levsin in place of dicyclomine.  Please take the zofran, phenergan and levsin sparingly as all of these can worsen your diarrhea.  Follow up in 6 months

## 2021-05-18 NOTE — Progress Notes (Signed)
Referring Provider: Leeanne Rio, MD Primary Care Physician:  Leeanne Rio, MD Primary GI Physician: Laural Golden  Chief Complaint  Patient presents with   Follow-up    Nausea for past 2 weeks, no appetite, abdominal spasms, a little constipation, bloating. A a little stool every day.    HPI:   Teresa Benitez is a 65 y.o. female with past medical history of Depression, fibromyalgia, hep c, hypothyroidism, thrombocytopenia, chronic constipation, cirrhosis, DDD.   Patient presenting today for follow up of cirrhosis, nausea and constipation  Cirrhosis:secondary to chronic hepatitis C successfully treated 5 years ago, SVR. Hx of thrombocytopenia as well. Most recent labs in dec 2021 revealed HFP WNL. AFP July 2021 also unremarkable at 3.1. no recent INR available for review. States she had labs done at pcp last month (we will obtain these records). CT abdomen/pelvis w contrast in January 2022 showed cirrhosis with evidence of portal htn, including splenomegaly and recannulized paraumbilical vein. Last liver US July 2021 with consistent findings as CT.   Cirrhosis related questions: Hematemesis/coffee ground emesis: no History of variceal bleeding: no Abdominal pain: yes Abdominal distention/worsening ascites: no Fever/chills: no Episodes of confusion/disorientation: no Taking diuretics?:no Prior history of banding?: no Prior episodes of SBP: no   Abdominal pain/constipation: States that she is taking bentyl for her abdominal spasms that usually in her upper abdomen, had good results previously with bentyl, but reports that it is not really helping anymore. Reports decrease appetite denies any vomiting but has very bad nausea, states she has zofran which helps sometimes, but other times it does not. She also had phenergan that she was using for severe nausea. Denies early satiety or dysphagia. Does not seem to matter what foods she eats in relation to her abdominal pain, denies any  postprandial pain. Denies melena or hematochezia. States she has had these symptoms since last novemeber, had negative h pylori testing then. No chronic NSAID use. States that after she saw Dr. Laural Golden in December and started [facility], symptoms improved so she cancelled EGD. She recently ran out of movantik and has been taking miralax intermittently for the past 2 weeks. She is having small, insufficient feeling BMs, usually once per day. She is on chronic opiates for pain, recently had oxycodone decreased. She reports that symptoms started back up over the past 2 weeks. Reports that nausea typically starts when she feels like she needs to have a BM  Last Colonoscopy:January 2017, removal of polyp, recommended to have repeat surveillance colonoscopy in jan 2022  Last Endoscopy:(08/23/17)  Recommendations:  Due for repeat colonoscopy in jan 2022  Past Medical History:  Diagnosis Date   Carpal tunnel syndrome, bilateral 08/26/2018   Chest pain    negative myoview 02/14/2015   Chronic constipation    Cirrhosis ([place])    DDD (degenerative disc disease), lumbar    Depression    Fibromyalgia    Hepatitis C    treated, none since 2016   Hepatitis C approx. 2004   History of kidney stones    Hypertension    Hypothyroidism    Liver disease    Peripheral neuropathy 08/26/2018   Spleen enlarged    Thrombocytopenia ([place])    Vision abnormalities     Past Surgical History:  Procedure Laterality Date   CARDIAC CATHETERIZATION     with stent   [place]  5-6 years ago    Done In [place]   ESOPHAGOGASTRODUODENOSCOPY (EGD) WITH PROPOFOL N/A 08/23/2017  Procedure: ESOPHAGOGASTRODUODENOSCOPY (EGD) WITH PROPOFOL;  Surgeon: Rogene Houston, MD;  Location: [facility];  Service: Endoscopy;  Laterality: N/A;   Para Thyhoid  2005   PARTIAL HYSTERECTOMY     Patient states that she had 16 years ago?1998   UPPER GASTROINTESTINAL ENDOSCOPY      Current Outpatient  Medications  Medication Sig Dispense Refill   alum & mag hydroxide-simeth (MAALOX/MYLANTA) 200-200-20 MG/5ML suspension Take 15 mLs by mouth every 6 (six) hours as needed for indigestion or heartburn.     amLODipine (NORVASC) 5 MG tablet Take 5 mg by mouth daily.     aspirin EC 81 MG tablet Take 81 mg by mouth daily.     atorvastatin (LIPITOR) 10 MG tablet Take 1 tablet by mouth daily.     BELBUCA 150 MCG FILM 450 mcg 2 (two) times daily.   0   buPROPion (ZYBAN) 150 MG 12 hr tablet TAKE 1 TABLET BY MOUTH TWICE A DAY 60 tablet 2   Cholecalciferol (VITAMIN D PO) Take 1,000 Units by mouth daily.     dicyclomine (BENTYL) 10 MG capsule TAKE 1 CAPSULE BY MOUTH THREE TIMES DAILY AS NEEDED FOR  SPASMS 90 capsule 0   Fluticasone-Umeclidin-Vilant (TRELEGY ELLIPTA) 100-62.5-25 MCG/INH AEPB Inhale 1 puff into the lungs daily. 14 each 6   gabapentin (NEURONTIN) 300 MG capsule Take 1,200 mg by mouth 3 (three) times daily.      levothyroxine (SYNTHROID) 100 MCG tablet Take 125 mcg by mouth daily before breakfast.      lisinopril (ZESTRIL) 40 MG tablet Take 40 mg by mouth daily.     methocarbamol (ROBAXIN) 500 MG tablet Take 500-750 mg by mouth 2 (two) times daily as needed for muscle spasms (takes 1 tablet in the morning and 1.5 tablet at bedtime). Takes a dose every morning and another dose at bedtime only if needed for pain     mirtazapine (REMERON) 15 MG tablet TAKE 1 TABLET BY MOUTH EVERY DAY AT NIGHT  2   Multiple Vitamin (MULTI-VITAMINS) TABS Take 1 tablet by mouth daily.     naloxegol oxalate (MOVANTIK) 12.5 MG TABS tablet Take 1 tablet (12.5 mg total) by mouth daily. 30 tablet 2   naproxen sodium (ANAPROX) 220 MG tablet Take 440 mg by mouth daily as needed (pain).     ondansetron (ZOFRAN) 4 MG tablet Take 1 tablet (4 mg total) by mouth every 8 (eight) hours as needed for nausea or vomiting. Patient takes in the morning. 30 tablet 2   oxyCODONE (OXY IR/ROXICODONE) 5 MG immediate release tablet Take 10 mg  by mouth every 8 (eight) hours as needed for moderate pain or severe pain (depends on pain level if takes 1-3 tablets).      pantoprazole (PROTONIX) 40 MG tablet TAKE 1 TABLET BY MOUTH DAILY BEFORE BREAKFAST 90 tablet 1   Tiotropium Bromide-Olodaterol (STIOLTO RESPIMAT) 2.5-2.5 MCG/ACT AERS Inhale 2 puffs into the lungs daily. 4 g 5   vitamin B-12 (CYANOCOBALAMIN) 500 MCG tablet Take 1,000 mcg by mouth daily.      Wheat Dextrin (BENEFIBER DRINK MIX) PACK Take 4 g by mouth at bedtime.     propranolol (INDERAL) 10 MG tablet Take 10 mg by mouth 2 (two) times daily.      No current facility-administered medications for this visit.    Allergies as of 05/18/2021 - Review Complete 05/18/2021  Allergen Reaction Noted   Ampicillin Hives, Shortness Of Breath, and Swelling 02/04/2007   Carbamazepine  12/29/2018  Ciprofloxacin Hives 09/28/2013   Dilantin [phenytoin sodium extended]  01/27/2019   Sulfamethoxazole Hives 02/04/2007    Family History  Problem Relation Age of Onset   Healthy Mother    Diabetes Father    Heart disease Father    Hypertension Father    Healthy Brother    Healthy Brother    Healthy Daughter    Healthy Son    Lung cancer Maternal Uncle     Social History   Socioeconomic History   Marital status: Divorced    Spouse name: Not on file   Number of children: Not on file   Years of education: Not on file   Highest education level: Not on file  Occupational History   Not on file  Tobacco Use   Smoking status: Every Day    Packs/day: 1.00    Years: 44.00    Pack years: 44.00    Types: Cigarettes   Smokeless tobacco: Never   Tobacco comments:    Patient states that she is down to 6 per week  Vaping Use   Vaping Use: Never used  Substance and Sexual Activity   Alcohol use: No   Drug use: No   Sexual activity: Not on file  Other Topics Concern   Not on file  Social History Narrative   Not on file   Social Determinants of Health   Financial Resource  Strain: Not on file  Food Insecurity: Not on file  Transportation Needs: Not on file  Physical Activity: Not on file  Stress: Not on file  Social Connections: Not on file   Review of Systems: Gen: Denies fever, chills, fatigue, weakness, weight loss. Endorses decreased appetite CV: Denies chest pain, palpitations, syncope, peripheral edema, and claudication. Resp: Denies dyspnea at rest, cough, wheezing, coughing up blood, and pleurisy. GI: Denies vomiting blood, jaundice, and fecal incontinence. Denies dysphagia or odynophagia. Endorses nausea Derm: Denies rash, itching, dry skin Psych: Denies depression, anxiety, memory loss, confusion. No homicidal or suicidal ideation.  Heme: Denies bruising, bleeding, and enlarged lymph nodes.  Physical Exam: BP (!) 155/82 (BP Location: Left Arm, Patient Position: Sitting, Cuff Size: Large)   Pulse 69   Temp 98.6 F (37 C) (Oral)   Ht 5' 11.5" (1.816 m)   Wt 231 lb (104.8 kg)   BMI 31.77 kg/m  General:   Alert and oriented. No distress noted. Pleasant and cooperative.  Head:  Normocephalic and atraumatic. Eyes:  Conjuctiva clear without scleral icterus. Mouth:  Oral mucosa pink and moist. Good dentition. No lesions. Heart: Normal rate and rhythm, s1 and s2 heart sounds present.  Lungs: Clear lung sounds in all lobes. Respirations equal and unlabored. Abdomen:  +BS, soft, non-tender. No rebound or guarding. No HSM or masses noted. Distended without noted ascites. Derm: No palmar erythema or jaundice Msk:  Symmetrical without gross deformities. Normal posture. Extremities:  Without edema. Neurologic:  Alert and  oriented x4 Psych:  Alert and cooperative. Normal mood and affect.  Invalid input(s): 6 MONTHS   ASSESSMENT: Sreeja Brandell is a 65 y.o. female presenting today for follow up of cirrhosis, constipation and abdominal pain.  Patient has not been seen for cirrhosis follow up in clinic since Dec 2021. Reports recent labs at PCP, we  will obtain these, may need to order INR/AFP depending what labs PCP checked, patient is aware of this. We will repeat RUQ [place] as last one was in July 2021. Patient is a/ox4 without obvious signs of HE. No ascites  present. No LE edema. Patient has never had EGD, we will schedule this for evaluation of presence of varices at time of repeat colonoscopy. She continues to avoid NSAIDs.  Patient last seen at the end of last year with constipation, abdominal pain and nausea. She was started on movantik and scheduled for EGD. States that she started [facility] and started having regular BMs, symptoms improved so she cancelled EGD. Reports about 2 weeks ago, her symptoms started back up. She is out of movantik, having a very small, non sufficient BM daily. She denies any melena, hematochezia, early satiety, weight loss, or vomiting. Reports nausea that is worse at times when she feels the urge to have a BM. She has taken miralax a few times over the past few weeks but continues to only have small BMs, upper abdominal pain and bloating with decreased appetite.I suspect that constipation/stool burden is the cause of her abdominal pain and nausea. We will proceed with bowel prep and restart movantik thereafter.  Requesting refill of zofran and phenergan, uses phenergan only for severe nausea. She states she had good luck with dicyclomine previously, but recently feels it is not helping much with her abdominal cramping. We can try levsin to see if this works better for her. Discussed that she should use levsin, phenergan and zofran as sparingly as possible, as all of these contribute to constipation, just as chronic opiate use does.   PLAN:  1. plan for EGD/colonoscopy in october 2. Bowel prep for constipation  3. Restart Movantik after bowel prep 4. Rx Levsin in place of bentyl, use sparingly 5. Update RUQ ultrasound 6. Avoid NSAIDs 7. Obtain labs from PCP, may need to add INR/AFP depending what PCP checked 8.  Refills of zofran and phenergan, use sparingly  Follow Up: 6 months  Case discussed with Dr. Jenetta Downer who is in agreement with plan of care, as outlined above.   Briant Angelillo L. Alver Sorrow, MSN, APRN, AGNP-C Adult-Gerontology Nurse Practitioner [facility]

## 2021-05-22 ENCOUNTER — Other Ambulatory Visit (INDEPENDENT_AMBULATORY_CARE_PROVIDER_SITE_OTHER): Payer: Self-pay | Admitting: Gastroenterology

## 2021-05-22 ENCOUNTER — Telehealth (INDEPENDENT_AMBULATORY_CARE_PROVIDER_SITE_OTHER): Payer: Self-pay | Admitting: Gastroenterology

## 2021-05-22 DIAGNOSIS — K7460 Unspecified cirrhosis of liver: Secondary | ICD-10-CM

## 2021-05-22 MED ORDER — DICYCLOMINE HCL 10 MG PO CAPS: 10.0000 mg | ORAL_CAPSULE | Freq: Three times a day (TID) | ORAL | 1 refills | Status: DC | PRN

## 2021-05-22 NOTE — Telephone Encounter (Signed)
Discussed with pt. Pt states she will pick up bw orders next week and take to lab. Pt also needed to reschedule ultrasound. Could not make appt that was given. Number given to scheduling for pt to call and reschedule.

## 2021-05-22 NOTE — Telephone Encounter (Signed)
Pt states dicyclomine was changed to hyoscyamine. States it is over 100 dollars and would like to go back to dicyclomine and take 2 every 8 hours instead of one every 8 hours.   [facility] [place]

## 2021-05-22 NOTE — Telephone Encounter (Signed)
Called and discussed with pt per [facility] Refill of dicyclomine was sent, she can continue taking two every 8 hours, only as needed, however, make sure she is aware this could worsen her constipation, as we discussed at her visit last week. Pt verbalized understanding.

## 2021-05-22 NOTE — Telephone Encounter (Signed)
Most recent Labs obtained from PCP, we will need to check AFP and INR for 6 month cirrhosis surveillance since these were not drawn.

## 2021-05-22 NOTE — Telephone Encounter (Signed)
Left message to return call 

## 2021-05-25 ENCOUNTER — Ambulatory Visit (HOSPITAL_COMMUNITY): Payer: Medicare HMO

## 2021-05-25 DIAGNOSIS — U071 COVID-19: Secondary | ICD-10-CM

## 2021-05-25 HISTORY — DX: COVID-19: U07.1

## 2021-06-08 ENCOUNTER — Ambulatory Visit (HOSPITAL_COMMUNITY): Payer: Medicare HMO | Attending: Gastroenterology

## 2021-06-15 ENCOUNTER — Telehealth: Payer: Self-pay | Admitting: Internal Medicine

## 2021-06-15 ENCOUNTER — Ambulatory Visit (HOSPITAL_COMMUNITY): Payer: Medicare HMO

## 2021-06-15 ENCOUNTER — Other Ambulatory Visit: Payer: Self-pay | Admitting: Primary Care

## 2021-06-15 NOTE — Telephone Encounter (Signed)
Lm for patient.  

## 2021-06-16 NOTE — Telephone Encounter (Signed)
I am okay to have her scheduled with me.

## 2021-06-16 NOTE — Telephone Encounter (Signed)
If has any OSA or bipap issues prefer she see alva or Sood  - otherwise I am happy to see but must bring all active meds with her including otcs

## 2021-06-16 NOTE — Telephone Encounter (Signed)
Called and spoke with patient. She stated that she would like to switch from Dr. Shearon Stalls to one of the doctors located in [place]. The [facility] is closer to her since she lives in [place]. She wanted to let Dr. Shearon Stalls know that she was is very appreciative of her care and has nothing against her, she just wants to be cut down her travel time.   I asked her if she had any preference of the doctors located in [place]. She stated that she did not.   Dr. Shearon Stalls, please advise if you are ok with her switching to the [facility] office.  Drs. Wert, Powderly and Experiment, please advise if any of you all would like to take her as a patient. Thanks!

## 2021-06-16 NOTE — Telephone Encounter (Signed)
Called patient but she did not answer. Left message for her to call back.  

## 2021-06-19 NOTE — Telephone Encounter (Signed)
Appt with VS 08/09/21  Nothing further needed per pt

## 2021-06-19 NOTE — Telephone Encounter (Signed)
Great, thanks for taking care of her.

## 2021-07-06 ENCOUNTER — Encounter (INDEPENDENT_AMBULATORY_CARE_PROVIDER_SITE_OTHER): Payer: Self-pay

## 2021-07-23 ENCOUNTER — Other Ambulatory Visit: Payer: Self-pay | Admitting: Primary Care

## 2021-07-27 ENCOUNTER — Other Ambulatory Visit: Payer: Self-pay | Admitting: Primary Care

## 2021-08-04 ENCOUNTER — Telehealth: Payer: Self-pay | Admitting: Internal Medicine

## 2021-08-04 ENCOUNTER — Other Ambulatory Visit: Payer: Self-pay

## 2021-08-04 NOTE — Telephone Encounter (Signed)
Patient is asking for trelegy 100 refill. Due to see Dr. Halford Chessman next week in [facility] office.   Sending to Dr. Shearon Stalls for permission on reordering inhaler. Geraldo Pitter NP prescribed trelegy 100 to patient but is unavailable until 08/07/21   Dr. Shearon Stalls please advise. Thank you!

## 2021-08-07 MED ORDER — TRELEGY ELLIPTA 100-62.5-25 MCG/ACT IN AEPB: 1.0000 | INHALATION_SPRAY | Freq: Every day | RESPIRATORY_TRACT | 0 refills | Status: DC

## 2021-08-07 NOTE — Telephone Encounter (Signed)
Spoke with pt and relayed message. Nothing further needed at this time.

## 2021-08-09 ENCOUNTER — Ambulatory Visit (INDEPENDENT_AMBULATORY_CARE_PROVIDER_SITE_OTHER): Payer: Medicare HMO | Admitting: Pulmonary Disease

## 2021-08-09 ENCOUNTER — Other Ambulatory Visit: Payer: Self-pay

## 2021-08-09 ENCOUNTER — Encounter: Payer: Self-pay | Admitting: Pulmonary Disease

## 2021-08-09 VITALS — BP 132/84 | HR 72 | Temp 98.4°F | Ht 71.0 in | Wt 234.1 lb

## 2021-08-09 DIAGNOSIS — R911 Solitary pulmonary nodule: Secondary | ICD-10-CM

## 2021-08-09 DIAGNOSIS — Z720 Tobacco use: Secondary | ICD-10-CM

## 2021-08-09 DIAGNOSIS — J449 Chronic obstructive pulmonary disease, unspecified: Secondary | ICD-10-CM | POA: Diagnosis not present

## 2021-08-09 DIAGNOSIS — J441 Chronic obstructive pulmonary disease with (acute) exacerbation: Secondary | ICD-10-CM | POA: Diagnosis not present

## 2021-08-09 MED ORDER — AZITHROMYCIN 250 MG PO TABS: ORAL_TABLET | ORAL | 0 refills | Status: AC

## 2021-08-09 MED ORDER — PREDNISONE 10 MG PO TABS: ORAL_TABLET | ORAL | 0 refills | Status: AC

## 2021-08-09 MED ORDER — BUPROPION HCL ER (SMOKING DET) 150 MG PO TB12
150.0000 mg | ORAL_TABLET | Freq: Two times a day (BID) | ORAL | 3 refills | Status: DC
Start: 2021-08-09 — End: 2022-07-09

## 2021-08-09 MED ORDER — ALBUTEROL SULFATE HFA 108 (90 BASE) MCG/ACT IN AERS: 2.0000 | INHALATION_SPRAY | Freq: Four times a day (QID) | RESPIRATORY_TRACT | 5 refills | Status: AC | PRN

## 2021-08-09 NOTE — Patient Instructions (Signed)
Prednisone 10 mg pill >> 3 pills daily for 2 days, 2 pills daily for 2 days, 1 pill daily for 2 days.  Zithromax 250 mg pill >> 2 pills on day 1, then 1 pill daily for next 4 days.  Bupropion (zyban) 150 mg pill >> 1 pill daily for 3 days, then 1 pill twice per day for next 4 days.  Set your quit date to stop smoking after being on zyban for 1 week and continue taking 1 pill twice per day for duration of therapy.  Follow up in 3 months.

## 2021-08-09 NOTE — Progress Notes (Signed)
[facility]  Chief Complaint  Patient presents with   New Patient (Initial Visit)    Prev. Seen dr. Shearon Stalls. Switching to [place] office for convenience.     Past Surgical History:  She  has a past surgical history that includes Para Thyhoid (2005); Partial hysterectomy; Colonoscopy (5-6 years ago ); Upper gastrointestinal endoscopy; Cesarean section (1996); Cardiac catheterization; and Esophagogastroduodenoscopy (egd) with propofol (N/A, 08/23/2017).  Past Medical History:  Carpal tunnel, Constipation, Cirrhosis, Hep C, DJD, Depression, Fibromyalgia, Nephrolithiasis, HTN, Neuropathy, Hypothyroidism s/p partial thyroidectomy, COVID 12 June 2021  Constitutional:  BP 132/84   Pulse 72   Temp 98.4 F (36.9 C)   Ht 5\' 11"  (1.803 m)   Wt 234 lb 1.9 oz (106.2 kg)   SpO2 96% Comment: ra  BMI 32.65 kg/m   Brief Summary:  Teresa Benitez is a 65 y.o. female smoker with COPD, asthma, and lung nodule.       Subjective:   Previously seen by Dr. Shearon Stalls.  She started smoking again after her father in law passed away.  She was able to quit prior to this for about 6 weeks after using zyban.  She didn't have any trouble tolerating zyban.  She was smoking 2 ppd, but is now down to 1/2 ppd.  She had COVID in September.  Then got another respiratory infection (?RSV) in October.  Was on prednisone and antibiotic twice.  Over the past several days she is having more cough and chest congestion.  Feels tight in her chest and getting wheeze.  Feeling more short of breath with walking.  Ran out of trelegy for several days and breathing was much worse.  Has been using albuterol more frequently.  CT chest from August showed stable nodule in Rt upper lung.  Chest xray from 07/19/21 was normal.   Physical Exam:   Appearance - well kempt   ENMT - no sinus tenderness, no oral exudate, no LAN, Mallampati 3 airway, no stridor  Respiratory - equal breath  sounds bilaterally, no wheezing or rales  CV - s1s2 regular rate and rhythm, no murmurs  Ext - no clubbing, no edema  Skin - no rashes  Psych - normal mood and affect   Pulmonary testing:  PFT 04/07/20 >> FEV1 2.04 (68%), FEV1% 72, TLC 5.33 (91%), DLCO 67%, +BD  Chest Imaging:  CT chest 05/17/21 >> atherosclerosis, 10 x 6 mm GGO nodule RUL, calcified granulomas, basilar scarring, changes of cirrhosis, Rt adrenal adenoma  Social History:  She  reports that she has been smoking cigarettes. She has a 44.00 pack-year smoking history. She has never used smokeless tobacco. She reports that she does not drink alcohol and does not use drugs.  Family History:  Her family history includes Diabetes in her father; Healthy in her brother, brother, daughter, mother, and son; Heart disease in her father; Hypertension in her father; Lung cancer in her maternal uncle.     Assessment/Plan:   COPD with asthma. - she has acute exacerbation - will give her course of zithromax and prednisone - continue trelegy - prn albuterol  Tobacco abuse. - will have her try zyban again; advised her to start after she completes treatment for exacerbation - dosing schedule, setting quit date, and side effects discussed  Rt upper lobe ground glass nodule. - will need follow up CT chest without contrast in August 2024  Cirrhosis with hx of Hepatitis C. - followed by Dr. Hildred Laser with Gastroenterology  Time Spent Involved in Patient Care on Day of Examination:  32 minutes  Follow up:   Patient Instructions  Prednisone 10 mg pill >> 3 pills daily for 2 days, 2 pills daily for 2 days, 1 pill daily for 2 days.  Zithromax 250 mg pill >> 2 pills on day 1, then 1 pill daily for next 4 days.  Bupropion (zyban) 150 mg pill >> 1 pill daily for 3 days, then 1 pill twice per day for next 4 days.  Set your quit date to stop smoking after being on zyban for 1 week and continue taking 1 pill twice per day for  duration of therapy.  Follow up in 3 months.  Medication List:   Allergies as of 08/09/2021       Reactions   Ampicillin Hives, Shortness Of Breath, Swelling   Lips swelling Has patient had a PCN reaction causing immediate rash, facial/tongue/throat swelling, SOB or lightheadedness with hypotension: Yes Has patient had a PCN reaction causing severe rash involving mucus membranes or skin necrosis: No Has patient had a PCN reaction that required hospitalization: No Has patient had a PCN reaction occurring within the last 10 years: Yes If all of the above answers are "NO", then may proceed with Cephalosporin use.   Carbamazepine    Nausea, headache   Ciprofloxacin Hives   Dilantin [phenytoin Sodium Extended]    nausea   Sulfamethoxazole Hives        Medication List        Accurate as of August 09, 2021 10:06 AM. If you have any questions, ask your nurse or doctor.          albuterol 108 (90 Base) MCG/ACT inhaler Commonly known as: VENTOLIN HFA Inhale 2 puffs into the lungs every 6 (six) hours as needed for wheezing or shortness of breath. Started by: Chesley Mires, MD   alum & mag hydroxide-simeth 200-200-20 MG/5ML suspension Commonly known as: MAALOX/MYLANTA Take 15 mLs by mouth every 6 (six) hours as needed for indigestion or heartburn.   amLODipine 5 MG tablet Commonly known as: NORVASC Take 5 mg by mouth daily.   aspirin EC 81 MG tablet Take 81 mg by mouth daily.   atorvastatin 10 MG tablet Commonly known as: LIPITOR Take 1 tablet by mouth daily.   azithromycin 250 MG tablet Commonly known as: ZITHROMAX Take 2 tablets (500 mg total) by mouth daily for 1 day, THEN 1 tablet (250 mg total) daily for 4 days. Start taking on: August 09, 2021 Started by: Chesley Mires, MD   Belbuca 150 MCG Film Generic drug: Buprenorphine HCl 450 mcg 2 (two) times daily.   Benefiber Drink Mix Pack Take 4 g by mouth at bedtime.   buPROPion 150 MG 12 hr tablet Commonly  known as: ZYBAN Take 1 tablet (150 mg total) by mouth 2 (two) times daily.   dicyclomine 10 MG capsule Commonly known as: Bentyl Take 1 capsule (10 mg total) by mouth 3 (three) times daily as needed for spasms.   gabapentin 300 MG capsule Commonly known as: NEURONTIN Take 1,200 mg by mouth 3 (three) times daily.   levothyroxine 100 MCG tablet Commonly known as: SYNTHROID Take 125 mcg by mouth daily before breakfast.   lisinopril 40 MG tablet Commonly known as: ZESTRIL Take 40 mg by mouth daily.   methocarbamol 500 MG tablet Commonly known as: ROBAXIN Take 500-750 mg by mouth 2 (two) times daily as needed for muscle spasms (takes 1 tablet in the morning  and 1.5 tablet at bedtime). Takes a dose every morning and another dose at bedtime only if needed for pain   mirtazapine 15 MG tablet Commonly known as: REMERON TAKE 1 TABLET BY MOUTH EVERY DAY AT NIGHT   Multi-Vitamins Tabs Take 1 tablet by mouth daily.   naloxegol oxalate 12.5 MG Tabs tablet Commonly known as: Movantik Take 1 tablet (12.5 mg total) by mouth daily.   naproxen sodium 220 MG tablet Commonly known as: ALEVE Take 440 mg by mouth daily as needed (pain).   ondansetron 4 MG tablet Commonly known as: ZOFRAN Take 1 tablet (4 mg total) by mouth every 8 (eight) hours as needed for nausea or vomiting. Patient takes in the morning.   oxyCODONE 5 MG immediate release tablet Commonly known as: Oxy IR/ROXICODONE Take 10 mg by mouth every 8 (eight) hours as needed for moderate pain or severe pain (depends on pain level if takes 1-3 tablets).   pantoprazole 40 MG tablet Commonly known as: PROTONIX TAKE 1 TABLET BY MOUTH DAILY BEFORE BREAKFAST   predniSONE 10 MG tablet Commonly known as: DELTASONE Take 3 tablets (30 mg total) by mouth daily with breakfast for 2 days, THEN 2 tablets (20 mg total) daily with breakfast for 2 days, THEN 1 tablet (10 mg total) daily with breakfast for 2 days. Start taking on: August 09, 2021 Started by: Chesley Mires, MD   promethazine 25 MG tablet Commonly known as: PHENERGAN Take 1 tablet (25 mg total) by mouth every 8 (eight) hours as needed for nausea or vomiting.   propranolol 10 MG tablet Commonly known as: INDERAL Take 10 mg by mouth 2 (two) times daily.   Trelegy Ellipta 100-62.5-25 MCG/ACT Aepb Generic drug: Fluticasone-Umeclidin-Vilant Inhale 1 puff into the lungs daily.   vitamin B-12 500 MCG tablet Commonly known as: CYANOCOBALAMIN Take 1,000 mcg by mouth daily.   VITAMIN D PO Take 1,000 Units by mouth daily.        Signature:  Chesley Mires, MD [place] Pager - [phone] 08/09/2021, 10:06 AM

## 2021-08-15 ENCOUNTER — Telehealth: Payer: Self-pay | Admitting: Pulmonary Disease

## 2021-08-15 MED ORDER — PREDNISONE 10 MG PO TABS: ORAL_TABLET | ORAL | 0 refills | Status: AC

## 2021-08-15 NOTE — Telephone Encounter (Signed)
Call returned to patient, confirmed DOB. Made aware of VS recommendations. Voiced understanding.   Nothing further needed at this time.

## 2021-08-15 NOTE — Telephone Encounter (Signed)
"  Pt was given antibiotic and prednisone last week.  Pt has completed and still having shortness of breath.  Rescue inhaler ventolin doesn't seem to be helping as well.  Wanted suggestions of what to do next.  [facility] and spoke to patient. She said her SOB is the same since last OV. Cough has improved but wheezing is still there. SOB is now waking her up at night. States her inhalers don't seem to be helping.   Dr. Halford Chessman please advise.

## 2021-08-15 NOTE — Telephone Encounter (Signed)
She can have extended course of prednisone.  Please send script for prednisone 10 mg >> 2 pills daily for 3 days, 1 pill daily for 3 days, 1/2 pill daily for 3 days.

## 2021-08-16 ENCOUNTER — Other Ambulatory Visit (INDEPENDENT_AMBULATORY_CARE_PROVIDER_SITE_OTHER): Payer: Self-pay | Admitting: Gastroenterology

## 2021-08-16 NOTE — Telephone Encounter (Signed)
This is not our patient.  Forwarding to Scherrie Gerlach, NP who refilled this medication previously.

## 2021-09-01 ENCOUNTER — Telehealth: Payer: Self-pay | Admitting: Pulmonary Disease

## 2021-09-01 MED ORDER — TRELEGY ELLIPTA 100-62.5-25 MCG/ACT IN AEPB: 1.0000 | INHALATION_SPRAY | Freq: Every day | RESPIRATORY_TRACT | 11 refills | Status: DC

## 2021-09-01 NOTE — Telephone Encounter (Signed)
Patient called stating that she needs a refill on her Trelegy inhaler. Refill has been sent. Nothing further needed at this time.

## 2021-09-07 ENCOUNTER — Telehealth: Payer: Self-pay | Admitting: Pulmonary Disease

## 2021-09-07 DIAGNOSIS — J449 Chronic obstructive pulmonary disease, unspecified: Secondary | ICD-10-CM

## 2021-09-07 NOTE — Telephone Encounter (Signed)
Needs rov with chest xray

## 2021-09-07 NOTE — Telephone Encounter (Signed)
Primary Pulmonologist: Dr. Halford Chessman  Last office visit and with whom: Dr. Halford Chessman 08-09-2021 (has seen Dr. Shearon Stalls in past- changed doctors for location) What do we see them for (pulmonary problems): COPD with asthma  Last OV assessment/plan: see below   Was appointment offered to patient (explain)?  No    Reason for call: Patient called stating she has finished her 5th round of prednsione and 3rd round of a z-pak and states she is still not any better. Coughing up thin green mucus, wheezing, and very SOB. States walking short distances and while talking she is very SOB. Has had a fever in the last week of 100.7 oral. Night sweats but no chills or N/V/D. Negative Covid and Negative Flu.   Dr. Halford Chessman please advise     Allergies  Allergen Reactions   Ampicillin Hives, Shortness Of Breath and Swelling    Lips swelling Has patient had a PCN reaction causing immediate rash, facial/tongue/throat swelling, SOB or lightheadedness with hypotension: Yes Has patient had a PCN reaction causing severe rash involving mucus membranes or skin necrosis: No Has patient had a PCN reaction that required hospitalization: No Has patient had a PCN reaction occurring within the last 10 years: Yes If all of the above answers are "NO", then may proceed with Cephalosporin use.    Carbamazepine     Nausea, headache   Ciprofloxacin Hives   Dilantin [Phenytoin Sodium Extended]     nausea   Sulfamethoxazole Hives    Immunization History  Administered Date(s) Administered   Influenza,inj,Quad PF,6+ Mos 08/27/2017, 06/30/2018, 07/19/2021   Influenza,inj,quad, With Preservative 06/18/2019   Moderna Sars-Covid-2 Vaccination 03/03/2020, 03/31/2020   PNEUMOCOCCAL CONJUGATE-20 04/12/2021   Pneumococcal Polysaccharide-23 10/01/2018      Assessment/Plan:    COPD with asthma. - she has acute exacerbation - will give her course of zithromax and prednisone - continue trelegy - prn albuterol   Tobacco abuse. - will have  her try zyban again; advised her to start after she completes treatment for exacerbation - dosing schedule, setting quit date, and side effects discussed   Rt upper lobe ground glass nodule. - will need follow up CT chest without contrast in August 2024   Cirrhosis with hx of Hepatitis C. - followed by Dr. Hildred Laser with Gastroenterology

## 2021-09-07 NOTE — Telephone Encounter (Signed)
Pt returned call. She is agreeable to CXR. States she will get the CXR this week. Asked patient if she was willing to go to [facility] for an appt and she states she is unable. She can come to the [facility] office for an appt and states that tues. 12/20 at 9:00 she would be available.No openings on [facility] schedule until end of January.   Dr. Halford Chessman please advise if it is okay to double book patient?

## 2021-09-07 NOTE — Telephone Encounter (Signed)
Lmtcb for pt.  

## 2021-09-07 NOTE — Telephone Encounter (Signed)
Okay to double book visit. 

## 2021-09-07 NOTE — Telephone Encounter (Signed)
Called and informed patient. She is aware to come to [facility] office at 9:00 am on Tuesday morning. 09-12-2021

## 2021-09-11 ENCOUNTER — Ambulatory Visit (HOSPITAL_COMMUNITY)
Admission: RE | Admit: 2021-09-11 | Discharge: 2021-09-11 | Disposition: A | Discharge status: Home / Self Care | Payer: Medicare HMO | Source: Ambulatory Visit | Attending: Pulmonary Disease | Admitting: Pulmonary Disease

## 2021-09-11 ENCOUNTER — Other Ambulatory Visit: Payer: Self-pay

## 2021-09-11 DIAGNOSIS — J449 Chronic obstructive pulmonary disease, unspecified: Secondary | ICD-10-CM | POA: Diagnosis not present

## 2021-09-12 ENCOUNTER — Encounter: Payer: Self-pay | Admitting: Pulmonary Disease

## 2021-09-12 ENCOUNTER — Ambulatory Visit (INDEPENDENT_AMBULATORY_CARE_PROVIDER_SITE_OTHER): Payer: Medicare HMO | Admitting: Pulmonary Disease

## 2021-09-12 VITALS — BP 142/80 | HR 88 | Temp 99.4°F | Ht 70.5 in | Wt 226.1 lb

## 2021-09-12 DIAGNOSIS — Z720 Tobacco use: Secondary | ICD-10-CM

## 2021-09-12 DIAGNOSIS — R911 Solitary pulmonary nodule: Secondary | ICD-10-CM | POA: Diagnosis not present

## 2021-09-12 DIAGNOSIS — J449 Chronic obstructive pulmonary disease, unspecified: Secondary | ICD-10-CM | POA: Diagnosis not present

## 2021-09-12 DIAGNOSIS — J4550 Severe persistent asthma, uncomplicated: Secondary | ICD-10-CM

## 2021-09-12 DIAGNOSIS — J441 Chronic obstructive pulmonary disease with (acute) exacerbation: Secondary | ICD-10-CM

## 2021-09-12 MED ORDER — PREDNISONE 10 MG PO TABS: ORAL_TABLET | ORAL | 0 refills | Status: AC

## 2021-09-12 MED ORDER — ALBUTEROL SULFATE (2.5 MG/3ML) 0.083% IN NEBU: 2.5000 mg | INHALATION_SOLUTION | Freq: Four times a day (QID) | RESPIRATORY_TRACT | 5 refills | Status: AC | PRN

## 2021-09-12 MED ORDER — DOXYCYCLINE HYCLATE 100 MG PO TABS: 100.0000 mg | ORAL_TABLET | Freq: Two times a day (BID) | ORAL | 0 refills | Status: DC

## 2021-09-12 NOTE — Patient Instructions (Signed)
Doxycycline antibiotic 100 mg pill twice daily for 7 days  Prednisone 30 mg daily for 3 days, 20 mg daily for 3 days, 10 mg daily for 3 days  Will arrange for home nebulizer machine, and you can use albuterol in nebulizer every 6 hours as needed for cough/wheeze/chest congestion  Call after the holidays if not feeling better, and then we will schedule your CT chest sooner  If you are feeling better, then keep follow up appointment as previously scheduled in February 2023

## 2021-09-12 NOTE — Progress Notes (Signed)
[facility]  Chief Complaint  Patient presents with   Acute Visit    Sick call on 12/15- cough with green mucus, SOB, wheezing, fever last night of 100.9 oral for over a week.     Past Surgical History:  She  has a past surgical history that includes Para Thyhoid (2005); Partial hysterectomy; Colonoscopy (5-6 years ago ); Upper gastrointestinal endoscopy; Cesarean section (1996); Cardiac catheterization; and Esophagogastroduodenoscopy (egd) with propofol (N/A, 08/23/2017).  Past Medical History:  Carpal tunnel, Constipation, Cirrhosis, Hep C, DJD, Depression, Fibromyalgia, Nephrolithiasis, HTN, Neuropathy, Hypothyroidism s/p partial thyroidectomy, COVID 12 June 2021  Constitutional:  BP (!) 142/80 (BP Location: Left Arm, Patient Position: Sitting)    Pulse 88    Temp 99.4 F (37.4 C) (Oral)    Ht 5' 10.5" (1.791 m)    Wt 226 lb 1.3 oz (102.5 kg)    SpO2 97% Comment: ra   BMI 31.98 kg/m   Brief Summary:  Teresa Benitez is a 65 y.o. female smoker with COPD, asthma, and lung nodule.       Subjective:   She continues to have cough, fatigue, and sputum.  This has lingered on since before Thanksgiving.  She felt better with last course of antibiotics and prednisone, but once these finished her symptoms recurred.  She is bringing up green sputum.  Has been using mucinex.  Trelegy helps.  She doesn't feel like albuterol inhaler is getting into her lungs effectively.  She is not having sinus congestion, hemoptysis, skin rash, joint swelling, or leg swelling.  She was treated for thrush by her PCP a couple of weeks ago.  Her grandchildren had the flu about 2 weeks ago.  She isn't sure if she got the flu from them.  She hasn't smoked cigarettes in the past several days.  Still using zyban.  Chest xray from 09/11/21 showed subsegmental atelectasis.  Physical Exam:   Appearance - well kempt   ENMT - no sinus tenderness, no oral exudate, no LAN,  Mallampati 3 airway, no stridor  Respiratory - equal breath sounds bilaterally, no wheezing or rales  CV - s1s2 regular rate and rhythm, no murmurs  Ext - no clubbing, no edema  Skin - no rashes  Psych - normal mood and affect    Pulmonary testing:  PFT 04/07/20 >> FEV1 2.04 (68%), FEV1% 72, TLC 5.33 (91%), DLCO 67%, +BD  Chest Imaging:  CT chest 05/17/21 >> atherosclerosis, 10 x 6 mm GGO nodule RUL, calcified granulomas, basilar scarring, changes of cirrhosis, Rt adrenal adenoma  Social History:  She  reports that she has been smoking cigarettes. She has a 44.00 pack-year smoking history. She has never used smokeless tobacco. She reports that she does not drink alcohol and does not use drugs.  Family History:  Her family history includes Diabetes in her father; Healthy in her brother, brother, daughter, mother, and son; Heart disease in her father; Hypertension in her father; Lung cancer in her maternal uncle.     Discussion:  She has slow to resolved COPD/asthma flare up.  Difficulty to discern whether she is having repeated viral/bacterial infections versus continuous process.  She has several antibiotic allergies which limits options for oral therapies.  Nonetheless, will give her another course of antibiotics and prednisone.  If her symptoms persist or worsen, then she would to get follow up CT chest scheduled sooner.  Assessment/Plan:   COPD with asthma with persistent exacerbation. - will give her course  of doxycycline and prednisone - continue trelegy - will arrange for home nebulizer - prn albuterol - prn mucinex  Tobacco abuse. -  she has not smoked in the past several days; encouraged her to continue her smoking abstinence - continue zyban for now  Rt upper lobe ground glass nodule. - she will need to be schedule for follow up CT chest w/o contrast in August 2024  Cirrhosis with hx of Hepatitis C. - followed by Dr. Hildred Laser with Gastroenterology  Time  Spent Involved in Patient Care on Day of Examination:  33 minutes  Follow up:   Patient Instructions  Doxycycline antibiotic 100 mg pill twice daily for 7 days  Prednisone 30 mg daily for 3 days, 20 mg daily for 3 days, 10 mg daily for 3 days  Will arrange for home nebulizer machine, and you can use albuterol in nebulizer every 6 hours as needed for cough/wheeze/chest congestion  Call after the holidays if not feeling better, and then we will schedule your CT chest sooner  If you are feeling better, then keep follow up appointment as previously scheduled in February 2023  Medication List:   Allergies as of 09/12/2021       Reactions   Ampicillin Hives, Shortness Of Breath, Swelling   Lips swelling Has patient had a PCN reaction causing immediate rash, facial/tongue/throat swelling, SOB or lightheadedness with hypotension: Yes Has patient had a PCN reaction causing severe rash involving mucus membranes or skin necrosis: No Has patient had a PCN reaction that required hospitalization: No Has patient had a PCN reaction occurring within the last 10 years: Yes If all of the above answers are "NO", then may proceed with Cephalosporin use.   Carbamazepine    Nausea, headache   Ciprofloxacin Hives   Dilantin [phenytoin Sodium Extended]    nausea   Sulfamethoxazole Hives        Medication List        Accurate as of September 12, 2021  9:35 AM. If you have any questions, ask your nurse or doctor.          albuterol 108 (90 Base) MCG/ACT inhaler Commonly known as: VENTOLIN HFA Inhale 2 puffs into the lungs every 6 (six) hours as needed for wheezing or shortness of breath. What changed: Another medication with the same name was added. Make sure you understand how and when to take each. Changed by: Chesley Mires, MD   albuterol (2.5 MG/3ML) 0.083% nebulizer solution Commonly known as: PROVENTIL Take 3 mLs (2.5 mg total) by nebulization every 6 (six) hours as needed for  wheezing or shortness of breath. What changed: You were already taking a medication with the same name, and this prescription was added. Make sure you understand how and when to take each. Changed by: Chesley Mires, MD   alum & mag hydroxide-simeth 200-200-20 MG/5ML suspension Commonly known as: MAALOX/MYLANTA Take 15 mLs by mouth every 6 (six) hours as needed for indigestion or heartburn.   amLODipine 5 MG tablet Commonly known as: NORVASC Take 5 mg by mouth daily.   aspirin EC 81 MG tablet Take 81 mg by mouth daily.   atorvastatin 10 MG tablet Commonly known as: LIPITOR Take 1 tablet by mouth daily.   Belbuca 150 MCG Film Generic drug: Buprenorphine HCl 450 mcg 2 (two) times daily.   Benefiber Drink Mix Pack Take 4 g by mouth at bedtime.   buPROPion 150 MG 12 hr tablet Commonly known as: ZYBAN Take 1 tablet (150 mg  total) by mouth 2 (two) times daily.   dicyclomine 10 MG capsule Commonly known as: BENTYL TAKE 1 CAPSULE (10 MG TOTAL) BY MOUTH 3 (THREE) TIMES DAILY AS NEEDED FOR SPASMS.   doxycycline 100 MG tablet Commonly known as: VIBRA-TABS Take 1 tablet (100 mg total) by mouth 2 (two) times daily. Started by: Chesley Mires, MD   gabapentin 300 MG capsule Commonly known as: NEURONTIN Take 1,200 mg by mouth 3 (three) times daily.   levothyroxine 100 MCG tablet Commonly known as: SYNTHROID Take 125 mcg by mouth daily before breakfast.   lisinopril 40 MG tablet Commonly known as: ZESTRIL Take 40 mg by mouth daily.   methocarbamol 500 MG tablet Commonly known as: ROBAXIN Take 500-750 mg by mouth 2 (two) times daily as needed for muscle spasms (takes 1 tablet in the morning and 1.5 tablet at bedtime). Takes a dose every morning and another dose at bedtime only if needed for pain   mirtazapine 15 MG tablet Commonly known as: REMERON TAKE 1 TABLET BY MOUTH EVERY DAY AT NIGHT   Multi-Vitamins Tabs Take 1 tablet by mouth daily.   naloxegol oxalate 12.5 MG Tabs  tablet Commonly known as: Movantik Take 1 tablet (12.5 mg total) by mouth daily.   naproxen sodium 220 MG tablet Commonly known as: ALEVE Take 440 mg by mouth daily as needed (pain).   ondansetron 4 MG tablet Commonly known as: ZOFRAN Take 1 tablet (4 mg total) by mouth every 8 (eight) hours as needed for nausea or vomiting. Patient takes in the morning.   oxyCODONE 5 MG immediate release tablet Commonly known as: Oxy IR/ROXICODONE Take 10 mg by mouth every 8 (eight) hours as needed for moderate pain or severe pain (depends on pain level if takes 1-3 tablets).   pantoprazole 40 MG tablet Commonly known as: PROTONIX TAKE 1 TABLET BY MOUTH DAILY BEFORE BREAKFAST   predniSONE 10 MG tablet Commonly known as: DELTASONE Take 3 tablets (30 mg total) by mouth daily with breakfast for 3 days, THEN 2 tablets (20 mg total) daily with breakfast for 3 days, THEN 1 tablet (10 mg total) daily with breakfast for 3 days. Start taking on: September 12, 2021 Started by: Chesley Mires, MD   promethazine 25 MG tablet Commonly known as: PHENERGAN Take 1 tablet (25 mg total) by mouth every 8 (eight) hours as needed for nausea or vomiting.   propranolol 10 MG tablet Commonly known as: INDERAL Take 10 mg by mouth 2 (two) times daily.   Trelegy Ellipta 100-62.5-25 MCG/ACT Aepb Generic drug: Fluticasone-Umeclidin-Vilant Inhale 1 puff into the lungs daily.   vitamin B-12 500 MCG tablet Commonly known as: CYANOCOBALAMIN Take 1,000 mcg by mouth daily.   VITAMIN D PO Take 1,000 Units by mouth daily.        Signature:  Chesley Mires, MD [place] Pager - [phone] 09/12/2021, 9:35 AM

## 2021-09-13 ENCOUNTER — Telehealth: Payer: Self-pay | Admitting: Pulmonary Disease

## 2021-09-14 NOTE — Telephone Encounter (Signed)
Lm for patient.  

## 2021-11-10 ENCOUNTER — Other Ambulatory Visit (INDEPENDENT_AMBULATORY_CARE_PROVIDER_SITE_OTHER): Payer: Self-pay | Admitting: Gastroenterology

## 2021-11-10 NOTE — Telephone Encounter (Signed)
Called and spoke with pt to see if she ever received neb machine and she said that she did. Nothing further needed.

## 2021-11-12 ENCOUNTER — Other Ambulatory Visit (INDEPENDENT_AMBULATORY_CARE_PROVIDER_SITE_OTHER): Payer: Self-pay | Admitting: Internal Medicine

## 2021-11-20 ENCOUNTER — Encounter: Payer: Self-pay | Admitting: Pulmonary Disease

## 2021-11-20 ENCOUNTER — Ambulatory Visit (INDEPENDENT_AMBULATORY_CARE_PROVIDER_SITE_OTHER): Payer: Medicare HMO | Admitting: Pulmonary Disease

## 2021-11-20 ENCOUNTER — Other Ambulatory Visit: Payer: Self-pay

## 2021-11-20 VITALS — BP 132/88 | HR 74 | Temp 98.4°F | Ht 70.5 in | Wt 228.4 lb

## 2021-11-20 DIAGNOSIS — Z720 Tobacco use: Secondary | ICD-10-CM

## 2021-11-20 DIAGNOSIS — J432 Centrilobular emphysema: Secondary | ICD-10-CM

## 2021-11-20 DIAGNOSIS — R911 Solitary pulmonary nodule: Secondary | ICD-10-CM | POA: Diagnosis not present

## 2021-11-20 DIAGNOSIS — J449 Chronic obstructive pulmonary disease, unspecified: Secondary | ICD-10-CM

## 2021-11-20 NOTE — Progress Notes (Signed)
[facility]  Chief Complaint  Patient presents with   Follow-up    Patient feels cough, SOB and fatigue have improved since last OV    Past Surgical History:  She  has a past surgical history that includes Para Thyhoid (2005); Partial hysterectomy; Colonoscopy (5-6 years ago ); Upper gastrointestinal endoscopy; Cesarean section (1996); Cardiac catheterization; and Esophagogastroduodenoscopy (egd) with propofol (N/A, 08/23/2017).  Past Medical History:  Carpal tunnel, Constipation, Cirrhosis, Hep C, DJD, Depression, Fibromyalgia, Nephrolithiasis, HTN, Neuropathy, Hypothyroidism s/p partial thyroidectomy, COVID 12 June 2021  Constitutional:  BP 132/88 (BP Location: Left Arm, Patient Position: Sitting)    Pulse 74    Temp 98.4 F (36.9 C) (Temporal)    Ht 5' 10.5" (1.791 m)    Wt 228 lb 6.4 oz (103.6 kg)    SpO2 97% Comment: ra   BMI 32.31 kg/m   Brief Summary:  Teresa Benitez is a 66 y.o. female smoker with COPD, asthma, and lung nodule.       Subjective:   Breathing better compared to last visit.  Not needing albuterol much.  Still using trelegy and this helps.  Not having cough, wheeze, or sputum.  Has noticed some more congestion from allergies, but not  too bad.  She is down to 5 or 6 cigarettes per day.  Not longer using zyban.  She is eager to quit and knows she can do it.    Physical Exam:   Appearance - well kempt   ENMT - no sinus tenderness, no oral exudate, no LAN, Mallampati 3 airway, no stridor  Respiratory - equal breath sounds bilaterally, no wheezing or rales  CV - s1s2 regular rate and rhythm, no murmurs  Ext - no clubbing, no edema  Skin - no rashes  Psych - normal mood and affect     Pulmonary testing:  PFT 04/07/20 >> FEV1 2.04 (68%), FEV1% 72, TLC 5.33 (91%), DLCO 67%, +BD  Chest Imaging:  CT chest 05/17/21 >> atherosclerosis, 10 x 6 mm GGO nodule RUL, calcified granulomas, basilar scarring,  changes of cirrhosis, Rt adrenal adenoma  Social History:  She  reports that she has been smoking cigarettes. She has a 44.00 pack-year smoking history. She has never used smokeless tobacco. She reports that she does not drink alcohol and does not use drugs.  Family History:  Her family history includes Diabetes in her father; Healthy in her brother, brother, daughter, mother, and son; Heart disease in her father; Hypertension in her father; Lung cancer in her maternal uncle.      Assessment/Plan:   COPD with asthma. - continue trelegy - prn albuterol by [facility] or nebulizer - prn mucinex  Tobacco abuse. - previously used zyban with partial benefit - she wants to try quitting on her own; discussed setting quit date - review techniques to help with social addiction aspect of cigarette smoking - discussed the concerns about vaping  Rt upper lobe ground glass nodule. - she will need to be schedule for follow up CT chest w/o contrast in August 2024  Cirrhosis with hx of Hepatitis C. - she will be getting a new gastroenterologist now that Dr. Laural Golden has retired  Time Spent Involved in [place] on Day of Examination:  37 minutes  Follow up:   Patient Instructions  Call if your allergies get worse over the Spring and this starts to affect your breathing  You will need a follow up CT chest in August 2024  Follow up in 6 months  Medication List:   Allergies as of 11/20/2021       Reactions   Ampicillin Hives, Shortness Of Breath, Swelling   Lips swelling Has patient had a PCN reaction causing immediate rash, facial/tongue/throat swelling, SOB or lightheadedness with hypotension: Yes Has patient had a PCN reaction causing severe rash involving mucus membranes or skin necrosis: No Has patient had a PCN reaction that required hospitalization: No Has patient had a PCN reaction occurring within the last 10 years: Yes If all of the above answers are "NO", then may proceed with  Cephalosporin use.   Carbamazepine    Nausea, headache   Ciprofloxacin Hives   Dilantin [phenytoin Sodium Extended]    nausea   Sulfamethoxazole Hives        Medication List        Accurate as of November 20, 2021 12:21 PM. If you have any questions, ask your nurse or doctor.          albuterol 108 (90 Base) MCG/ACT inhaler Commonly known as: VENTOLIN HFA Inhale 2 puffs into the lungs every 6 (six) hours as needed for wheezing or shortness of breath.   albuterol (2.5 MG/3ML) 0.083% nebulizer solution Commonly known as: PROVENTIL Take 3 mLs (2.5 mg total) by nebulization every 6 (six) hours as needed for wheezing or shortness of breath.   alum & mag hydroxide-simeth 200-200-20 MG/5ML suspension Commonly known as: MAALOX/MYLANTA Take 15 mLs by mouth every 6 (six) hours as needed for indigestion or heartburn.   amLODipine 5 MG tablet Commonly known as: NORVASC Take 5 mg by mouth daily.   aspirin EC 81 MG tablet Take 81 mg by mouth daily.   atorvastatin 10 MG tablet Commonly known as: LIPITOR Take 1 tablet by mouth daily.   Belbuca 150 MCG Film Generic drug: Buprenorphine HCl 450 mcg 2 (two) times daily.   Benefiber Drink Mix Pack Take 4 g by mouth at bedtime.   buPROPion 150 MG 12 hr tablet Commonly known as: ZYBAN Take 1 tablet (150 mg total) by mouth 2 (two) times daily.   dicyclomine 10 MG capsule Commonly known as: BENTYL TAKE 1 CAPSULE (10 MG TOTAL) BY MOUTH 3 (THREE) TIMES DAILY AS NEEDED FOR SPASMS.   doxycycline 100 MG tablet Commonly known as: VIBRA-TABS Take 1 tablet (100 mg total) by mouth 2 (two) times daily.   gabapentin 300 MG capsule Commonly known as: NEURONTIN Take 1,200 mg by mouth 3 (three) times daily.   levothyroxine 100 MCG tablet Commonly known as: SYNTHROID Take 125 mcg by mouth daily before breakfast.   lisinopril 40 MG tablet Commonly known as: ZESTRIL Take 40 mg by mouth daily.   methocarbamol 500 MG tablet Commonly  known as: ROBAXIN Take 500-750 mg by mouth 2 (two) times daily as needed for muscle spasms (takes 1 tablet in the morning and 1.5 tablet at bedtime). Takes a dose every morning and another dose at bedtime only if needed for pain   mirtazapine 15 MG tablet Commonly known as: REMERON TAKE 1 TABLET BY MOUTH EVERY DAY AT NIGHT   Multi-Vitamins Tabs Take 1 tablet by mouth daily.   naloxegol oxalate 12.5 MG Tabs tablet Commonly known as: Movantik Take 1 tablet (12.5 mg total) by mouth daily.   naproxen sodium 220 MG tablet Commonly known as: ALEVE Take 440 mg by mouth daily as needed (pain).   ondansetron 4 MG tablet Commonly known as: ZOFRAN Take 1 tablet (4 mg total) by mouth  every 8 (eight) hours as needed for nausea or vomiting. Patient takes in the morning.   oxyCODONE 5 MG immediate release tablet Commonly known as: Oxy IR/ROXICODONE Take 10 mg by mouth every 8 (eight) hours as needed for moderate pain or severe pain (depends on pain level if takes 1-3 tablets).   pantoprazole 40 MG tablet Commonly known as: PROTONIX TAKE 1 TABLET BY MOUTH EVERY DAY BEFORE BREAKFAST   promethazine 25 MG tablet Commonly known as: PHENERGAN TAKE 1 TABLET BY MOUTH EVERY 8 HOURS AS NEEDED FOR NAUSEA OR VOMITING.   propranolol 10 MG tablet Commonly known as: INDERAL Take 10 mg by mouth 2 (two) times daily.   Trelegy Ellipta 100-62.5-25 MCG/ACT Aepb Generic drug: Fluticasone-Umeclidin-Vilant Inhale 1 puff into the lungs daily.   vitamin B-12 500 MCG tablet Commonly known as: CYANOCOBALAMIN Take 1,000 mcg by mouth daily.   VITAMIN D PO Take 1,000 Units by mouth daily.        Signature:  Chesley Mires, MD [place] Pager - [phone] 11/20/2021, 12:21 PM

## 2021-11-20 NOTE — Patient Instructions (Signed)
Call if your allergies get worse over the Spring and this starts to affect your breathing  You will need a follow up CT chest in August 2024  Follow up in 6 months

## 2021-11-21 ENCOUNTER — Ambulatory Visit (INDEPENDENT_AMBULATORY_CARE_PROVIDER_SITE_OTHER): Payer: Medicare HMO | Admitting: Internal Medicine

## 2021-12-21 ENCOUNTER — Telehealth: Payer: Self-pay | Admitting: Pulmonary Disease

## 2021-12-21 MED ORDER — MONTELUKAST SODIUM 10 MG PO TABS: 10.0000 mg | ORAL_TABLET | Freq: Every day | ORAL | 11 refills | Status: DC

## 2021-12-21 NOTE — Telephone Encounter (Signed)
Primary Pulmonologist: Dr.Sood  ?Last office visit and with whom: Dr. Halford Chessman 11/20/2021 ?What do we see them for (pulmonary problems): COPD with asthma / Centrilobular emphysema  ?Last OV assessment/plan: see below  ? ?Was appointment offered to patient (explain)?  No. None available at this time in [facility] office  ? ? ?Reason for call: Wheezing and SOB has gradually worsened since last visit. Patient states she thinks she has worsened because of increased amounts of pollen lately and that Dr. Halford Chessman told her at last OV to call back if her wheezing and SOB worsened and he would send in a medication for her. Has been using inhaler as prescribed as well as albuterol and  nebs prn. No fevers. No cough.  ? ?Dr. Halford Chessman please advise  ? ? ?Allergies  ?Allergen Reactions  ? Ampicillin Hives, Shortness Of Breath and Swelling  ?  Lips swelling ?Has patient had a PCN reaction causing immediate rash, facial/tongue/throat swelling, SOB or lightheadedness with hypotension: Yes ?Has patient had a PCN reaction causing severe rash involving mucus membranes or skin necrosis: No ?Has patient had a PCN reaction that required hospitalization: No ?Has patient had a PCN reaction occurring within the last 10 years: Yes ?If all of the above answers are "NO", then may proceed with Cephalosporin use. ?  ? Carbamazepine   ?  Nausea, headache  ? Ciprofloxacin Hives  ? Dilantin [Phenytoin Sodium Extended]   ?  nausea  ? Sulfamethoxazole Hives  ? ? ?Immunization History  ?Administered Date(s) Administered  ? Influenza,inj,Quad PF,6+ Mos 08/27/2017, 06/30/2018, 07/19/2021  ? Influenza,inj,quad, With Preservative 06/18/2019  ? Moderna Sars-Covid-2 Vaccination 03/03/2020, 03/31/2020  ? PNEUMOCOCCAL CONJUGATE-20 04/12/2021  ? Pneumococcal Polysaccharide-23 10/01/2018  ?  ?Assessment/Plan:  ?  ?COPD with asthma. ?- continue trelegy ?- prn albuterol by [facility] or nebulizer ?- prn mucinex ?  ?Tobacco abuse. ?- previously used zyban with partial benefit ?- she wants  to try quitting on her own; discussed setting quit date ?- review techniques to help with social addiction aspect of cigarette smoking ?- discussed the concerns about vaping ?  ?Rt upper lobe ground glass nodule. ?- she will need to be schedule for follow up CT chest w/o contrast in August 2024 ?  ?Cirrhosis with hx of Hepatitis C. ?- she will be getting a new gastroenterologist now that Dr. Laural Golden has retired ?  ?Time Spent Involved in Patient Care on Day of Examination:  ?37 minutes ?  ?Follow up:  ?  ?Patient Instructions  ?Call if your allergies get worse over the Spring and this starts to affect your breathing ?  ?You will need a follow up CT chest in August 2024 ?  ?Follow up in 6 months ?  ?

## 2021-12-21 NOTE — Telephone Encounter (Signed)
Please send script for singulair 10 mg qhs.  If this doesn't improve her symptoms, then she should call to schedule ROV with me or an NP. ?

## 2021-12-21 NOTE — Telephone Encounter (Signed)
ATC patient. Left detailed message (ok per DPR) with recs and info about medication and advised patient to call back for any further questions. Nothing further needed.  ?

## 2022-01-08 ENCOUNTER — Encounter (INDEPENDENT_AMBULATORY_CARE_PROVIDER_SITE_OTHER): Payer: Self-pay | Admitting: Gastroenterology

## 2022-01-08 ENCOUNTER — Ambulatory Visit (INDEPENDENT_AMBULATORY_CARE_PROVIDER_SITE_OTHER): Payer: Medicare HMO | Admitting: Gastroenterology

## 2022-01-08 VITALS — BP 187/78 | HR 70 | Temp 98.4°F | Ht 70.5 in | Wt 229.0 lb

## 2022-01-08 DIAGNOSIS — K5903 Drug induced constipation: Secondary | ICD-10-CM | POA: Diagnosis not present

## 2022-01-08 DIAGNOSIS — Z79891 Long term (current) use of opiate analgesic: Secondary | ICD-10-CM | POA: Diagnosis not present

## 2022-01-08 DIAGNOSIS — K7460 Unspecified cirrhosis of liver: Secondary | ICD-10-CM | POA: Diagnosis not present

## 2022-01-08 NOTE — Patient Instructions (Addendum)
Perform blood workup ?Schedule liver US ?Please call us back when ready to schedule your EGD and colonoscopy ?Continue same medications for now ?Continue Movantik 12,5 mg every day ?Can take 1 capful Miralax daily ?- Reduce salt intake to <2 g per day ?- Can take Tylenol max of 2 g per day (650 mg q8h) for pain ?- Avoid NSAIDs for pain ?- Avoid eating raw oysters/shellfish ?- Protein shake (Ensure or Boost) every night before going to sleep ? ?

## 2022-01-08 NOTE — Progress Notes (Signed)
Maylon Peppers, M.D. ?Gastroenterology & Hepatology ?[facility] ?[address] ?[place], [place] [address] ? ?Primary Care Physician: ?Leeanne Rio, MD ?[address] ?[place] [address] ? ?I will communicate my assessment and recommendations to the referring MD via EMR. ? ?Problems: ?Liver cirrhosis due to hepatitis C ?Hepatitis C, achieved SVR after treatment with DAA (unknown regimen) ?Opioid-induced constipation ? ?History of Present Illness: ?Teresa Benitez is a 66 y.o. female with PMH diabetes, liver cirrhosis due to hepatitis C status post treatment with DAA and achieved SVR, fibromyalgia, hypothyroidism, chronic constipation, depression, who presents for follow up of cirrhosis. ? ?The patient was last seen on 05/18/2021. At that time, the patient was scheduled for esophagogastroduodenospy for variceal screening and colonoscopy for history of colon polyps, but she never scheduled this procedure.  She was advised to restart taking Movantik for opiate-induced constipation after starting a bowel prep.  ? ?She states she had COVID and was not able to schedule any of the her procedures. ? ?The patient denies having any major complaints.  States that she feels very nauseated when she is about to have a bowel movement. States the Movantik helped her improve her bowel movements, she is having at least 1 bowel movement every other day.  Denies having any significant distention or abdominal pain. Denies vomiting, fever, chills, hematochezia, melena, hematemesis, diarrhea, jaundice, pruritus or weight loss. ? ?Most recent available labs from 12/07/2021 showed a CBC with platelet count of 138, white blood cell count 5.6,  hemoglobin 13.5, CMP with AST of 23, ALT 14, alkaline phosphatase 121, total bilirubin 0.5, normal renal function and electrolytes, no recent INR is available.   ? ?Takes oxycodone at least once a day for back pain. ? ?Cirrhosis related  questions: ?Hematemesis/coffee ground emesis: No ?History of variceal bleeding: No ?Abdominal pain: No ?Abdominal distention/worsening ascitesNo ?Fever/chills: No ?Episodes of confusion/disorientation: No ?Number of daily bowel movements:every day once ?Taking diuretics?: No ?Prior history of banding?: No ?Prior episodes of SBP: No ?Last time liver imaging was performed:06/19/2018 no liver masses in [place], had a CT abdoemn and pelvis with IV con on 10/05/2020 no large masses ?MELD score: ? ?Last EGD: 07/2017 ?- Normal esophagus. ?- Z-line regular, 42 cm from the incisors. ?- Portal hypertensive gastropathy. ?- Gastric antral vascular ectasia without bleeding. ?- Non-bleeding erosive gastropathy. ?- Normal duodenal bulb and second portion of the duodenum. ?- No specimens collected. ? ?Last Colonoscopy: 09/2015, removal of polyp, recommended to have repeat surveillance colonoscopy in jan 2022 ? ?Past Medical History: ?Past Medical History:  ?Diagnosis Date  ? Carpal tunnel syndrome, bilateral 08/26/2018  ? Chest pain   ? negative myoview 02/14/2015  ? Chronic constipation   ? Cirrhosis ([place])   ? COPD (chronic obstructive pulmonary disease) ([place])   ? COVID-19 05/2021  ? DDD (degenerative disc disease), lumbar   ? Depression   ? Fibromyalgia   ? Hepatitis C 2004  ? treated, none since 2016  ? History of kidney stones   ? Hypertension   ? Hypothyroidism   ? Peripheral neuropathy 08/26/2018  ? Spleen enlarged   ? Thrombocytopenia ([place])   ? Vision abnormalities   ? ? ?Past Surgical History: ?Past Surgical History:  ?Procedure Laterality Date  ? CARDIAC CATHETERIZATION    ? with stent  ? [place]  ? COLONOSCOPY  5-6 years ago   ? Done In [place]  ? ESOPHAGOGASTRODUODENOSCOPY (EGD) WITH PROPOFOL N/A 08/23/2017  ?  Procedure: ESOPHAGOGASTRODUODENOSCOPY (EGD) WITH PROPOFOL;  Surgeon: Rogene Houston, MD;  [facility];  Service: Endoscopy;  Laterality: N/A;  ? Para Thyhoid  2005  ? PARTIAL HYSTERECTOMY     ? Patient states that she had 16 years ago?1998  ? UPPER GASTROINTESTINAL ENDOSCOPY    ? ? ?Family History: ?Family History  ?Problem Relation Age of Onset  ? Healthy Mother   ? Diabetes Father   ? Heart disease Father   ? Hypertension Father   ? Healthy Brother   ? Healthy Brother   ? Healthy Daughter   ? Healthy Son   ? Lung cancer Maternal Uncle   ? ? ?Social History: ?Social History  ? ?Tobacco Use  ?Smoking Status Every Day  ? Packs/day: 0.50  ? Years: 44.00  ? Pack years: 22.00  ? Types: Cigarettes  ?Smokeless Tobacco Never  ?Tobacco Comments  ? Patient states that she is down to 6 per week  ? ?Social History  ? ?Substance and Sexual Activity  ?Alcohol Use No  ? ?Social History  ? ?Substance and Sexual Activity  ?Drug Use No  ? ? ?Allergies: ?Allergies  ?Allergen Reactions  ? Ampicillin Hives, Shortness Of Breath and Swelling  ?  Lips swelling ?Has patient had a PCN reaction causing immediate rash, facial/tongue/throat swelling, SOB or lightheadedness with hypotension: Yes ?Has patient had a PCN reaction causing severe rash involving mucus membranes or skin necrosis: No ?Has patient had a PCN reaction that required hospitalization: No ?Has patient had a PCN reaction occurring within the last 10 years: Yes ?If all of the above answers are "NO", then may proceed with Cephalosporin use. ?  ? Carbamazepine   ?  Nausea, headache  ? Ciprofloxacin Hives  ? Dilantin [Phenytoin Sodium Extended]   ?  nausea  ? Sulfamethoxazole Hives  ? ? ?Medications: ?Current Outpatient Medications  ?Medication Sig Dispense Refill  ? albuterol (PROVENTIL) (2.5 MG/3ML) 0.083% nebulizer solution Take 3 mLs (2.5 mg total) by nebulization every 6 (six) hours as needed for wheezing or shortness of breath. 360 mL 5  ? albuterol (VENTOLIN HFA) 108 (90 Base) MCG/ACT inhaler Inhale 2 puffs into the lungs every 6 (six) hours as needed for wheezing or shortness of breath. 8 g 5  ? alum & mag hydroxide-simeth (MAALOX/MYLANTA) 644-034-74 MG/5ML  suspension Take 15 mLs by mouth every 6 (six) hours as needed for indigestion or heartburn.    ? amLODipine (NORVASC) 5 MG tablet Take 5 mg by mouth daily.    ? aspirin EC 81 MG tablet Take 81 mg by mouth daily.    ? atorvastatin (LIPITOR) 10 MG tablet Take 1 tablet by mouth daily.    ? BELBUCA 150 MCG FILM 450 mcg 2 (two) times daily.   0  ? buPROPion (ZYBAN) 150 MG 12 hr tablet Take 1 tablet (150 mg total) by mouth 2 (two) times daily. 60 tablet 3  ? Cholecalciferol (VITAMIN D PO) Take 1,000 Units by mouth daily.    ? dicyclomine (BENTYL) 10 MG capsule TAKE 1 CAPSULE (10 MG TOTAL) BY MOUTH 3 (THREE) TIMES DAILY AS NEEDED FOR SPASMS. 270 capsule 1  ? Fluticasone-Umeclidin-Vilant (TRELEGY ELLIPTA) 100-62.5-25 MCG/ACT AEPB Inhale 1 puff into the lungs daily. 1 each 11  ? gabapentin (NEURONTIN) 400 MG capsule Take 1,200 mg by mouth 3 (three) times daily.     ? levothyroxine (SYNTHROID) 100 MCG tablet Take 125 mcg by mouth daily before breakfast.     ? lisinopril (ZESTRIL)  40 MG tablet Take 40 mg by mouth daily.    ? metFORMIN (GLUCOPHAGE-XR) 500 MG 24 hr tablet Take 500 mg by mouth daily with breakfast.    ? methocarbamol (ROBAXIN) 500 MG tablet Take 500-750 mg by mouth 2 (two) times daily as needed for muscle spasms (takes 1 tablet in the morning and 1.5 tablet at bedtime). Takes a dose every morning and another dose at bedtime only if needed for pain    ? mirtazapine (REMERON) 15 MG tablet TAKE 1 TABLET BY MOUTH EVERY DAY AT NIGHT  2  ? montelukast (SINGULAIR) 10 MG tablet Take 1 tablet (10 mg total) by mouth at bedtime. 30 tablet 11  ? Multiple Vitamin (MULTI-VITAMINS) TABS Take 1 tablet by mouth daily.    ? naloxegol oxalate (MOVANTIK) 12.5 MG TABS tablet Take 1 tablet (12.5 mg total) by mouth daily. 90 tablet 3  ? naproxen sodium (ANAPROX) 220 MG tablet Take 440 mg by mouth daily as needed (pain).    ? ondansetron (ZOFRAN) 4 MG tablet Take 1 tablet (4 mg total) by mouth every 8 (eight) hours as needed for nausea  or vomiting. Patient takes in the morning. 30 tablet 1  ? oxyCODONE (OXY IR/ROXICODONE) 5 MG immediate release tablet Take 10 mg by mouth every 8 (eight) hours as needed for moderate pain or severe pain (depends

## 2022-01-17 ENCOUNTER — Ambulatory Visit (HOSPITAL_COMMUNITY): Payer: Medicare HMO

## 2022-01-19 ENCOUNTER — Telehealth: Payer: Self-pay

## 2022-01-19 NOTE — Telephone Encounter (Signed)
Patient called stating she is having LLE pains that originate at her hip and wrap around to her back.She rates her pains 8 out of 10, she mostly has trouble at night and the pains wake her up. She states the pains feel deep in her leg and feels slightly swollen.She does elevate daily, she hasn't worn her thigh high compression since the Summer due to discomfort at her upper thigh, no hx of DVT to her knowledge. She was previously seen by Dr. Scot Dock in 2021 and patient was advised to f/u in 3 months but failed to do so. I advised that Dr. Scot Dock did not have any availability so I scheduled her to be seen by Dr. Donzetta Matters. Patient voiced her understanding. Appt scheduled.  ?

## 2022-01-25 ENCOUNTER — Ambulatory Visit (HOSPITAL_COMMUNITY): Payer: Medicare HMO

## 2022-01-29 ENCOUNTER — Ambulatory Visit (HOSPITAL_COMMUNITY)
Admission: RE | Admit: 2022-01-29 | Discharge: 2022-01-29 | Disposition: A | Discharge status: Home / Self Care | Payer: Medicare HMO | Source: Ambulatory Visit | Attending: Gastroenterology | Admitting: Gastroenterology

## 2022-01-29 DIAGNOSIS — K7460 Unspecified cirrhosis of liver: Secondary | ICD-10-CM | POA: Insufficient documentation

## 2022-01-30 ENCOUNTER — Other Ambulatory Visit: Payer: Self-pay

## 2022-01-30 DIAGNOSIS — I8393 Asymptomatic varicose veins of bilateral lower extremities: Secondary | ICD-10-CM

## 2022-01-31 ENCOUNTER — Ambulatory Visit (HOSPITAL_COMMUNITY)
Admission: RE | Admit: 2022-01-31 | Discharge: 2022-01-31 | Disposition: A | Discharge status: Home / Self Care | Payer: Medicare HMO | Source: Ambulatory Visit | Attending: Vascular Surgery | Admitting: Vascular Surgery

## 2022-01-31 ENCOUNTER — Encounter: Payer: Self-pay | Admitting: Vascular Surgery

## 2022-01-31 ENCOUNTER — Ambulatory Visit (INDEPENDENT_AMBULATORY_CARE_PROVIDER_SITE_OTHER): Payer: Medicare HMO | Admitting: Vascular Surgery

## 2022-01-31 VITALS — BP 185/102 | HR 65 | Temp 98.4°F | Resp 20 | Ht 70.5 in | Wt 230.0 lb

## 2022-01-31 DIAGNOSIS — I8393 Asymptomatic varicose veins of bilateral lower extremities: Secondary | ICD-10-CM | POA: Diagnosis present

## 2022-01-31 LAB — COMPREHENSIVE METABOLIC PANEL
AG Ratio: 1 (calc) (ref 1.0–2.5)
ALT: 12 U/L (ref 6–29)
AST: 22 U/L (ref 10–35)
Albumin: 3.7 g/dL (ref 3.6–5.1)
Alkaline phosphatase (APISO): 98 U/L (ref 37–153)
BUN: 12 mg/dL (ref 7–25)
CO2: 31 mmol/L (ref 20–32)
Calcium: 9.5 mg/dL (ref 8.6–10.4)
Chloride: 103 mmol/L (ref 98–110)
Creat: 0.76 mg/dL (ref 0.50–1.05)
Globulin: 3.7 g/dL (calc) (ref 1.9–3.7)
Glucose, Bld: 156 mg/dL — ABNORMAL HIGH (ref 65–99)
Potassium: 4.1 mmol/L (ref 3.5–5.3)
Sodium: 142 mmol/L (ref 135–146)
Total Bilirubin: 0.6 mg/dL (ref 0.2–1.2)
Total Protein: 7.4 g/dL (ref 6.1–8.1)

## 2022-01-31 LAB — CBC WITH DIFFERENTIAL/PLATELET
Absolute Monocytes: 330 cells/uL (ref 200–950)
Basophils Absolute: 39 cells/uL (ref 0–200)
Basophils Relative: 0.7 %
Eosinophils Absolute: 129 cells/uL (ref 15–500)
Eosinophils Relative: 2.3 %
HCT: 40.4 % (ref 35.0–45.0)
Hemoglobin: 13 g/dL (ref 11.7–15.5)
Lymphs Abs: 1338 cells/uL (ref 850–3900)
MCH: 28.7 pg (ref 27.0–33.0)
MCHC: 32.2 g/dL (ref 32.0–36.0)
MCV: 89.2 fL (ref 80.0–100.0)
MPV: 11.8 fL (ref 7.5–12.5)
Monocytes Relative: 5.9 %
Neutro Abs: 3763 cells/uL (ref 1500–7800)
Neutrophils Relative %: 67.2 %
Platelets: 122 10*3/uL — ABNORMAL LOW (ref 140–400)
RBC: 4.53 10*6/uL (ref 3.80–5.10)
RDW: 15.4 % — ABNORMAL HIGH (ref 11.0–15.0)
Total Lymphocyte: 23.9 %
WBC: 5.6 10*3/uL (ref 3.8–10.8)

## 2022-01-31 LAB — PROTIME-INR
INR: 1
Prothrombin Time: 10.3 s (ref 9.0–11.5)

## 2022-01-31 LAB — AFP TUMOR MARKER: AFP-Tumor Marker: 4 ng/mL

## 2022-01-31 NOTE — Progress Notes (Signed)
? ?Patient ID: Teresa Benitez, female   DOB: 1956-04-12, 66 y.o.   MRN: [id number] ? ?Reason for Consult: Follow-up ?  ?Referred by Leeanne Rio, MD ? ?Subjective:  ?   ?HPI: ? ?Teresa Benitez is a 66 y.o. female with history of left groin pain.  She has been evaluated here with left lower extremity reflux in the past but it was thought that her left groin pain was not related to this.  She has had varicosities since she was approximately 66 years of age.  She states that her left groin pain is getting worse and affects her when she is in all positions including sitting standing and laying and also when she is walking.  It prevents her from playing with her grandchildren.  She does not have any frank claudication symptoms.  The pain radiates up her left side.  She is being evaluated for cirrhosis of her liver.  She has never had any vascular issues in the past.  She is a current every day smoker. ? ?Past Medical History:  ?Diagnosis Date  ? Carpal tunnel syndrome, bilateral 08/26/2018  ? Chest pain   ? negative myoview 02/14/2015  ? Chronic constipation   ? Cirrhosis ([place])   ? COPD (chronic obstructive pulmonary disease) ([place])   ? COVID-19 05/2021  ? DDD (degenerative disc disease), lumbar   ? Depression   ? Fibromyalgia   ? Hepatitis C 2004  ? treated, none since 2016  ? History of kidney stones   ? Hypertension   ? Hypothyroidism   ? Peripheral neuropathy 08/26/2018  ? Spleen enlarged   ? Thrombocytopenia ([place])   ? Vision abnormalities   ? ?Family History  ?Problem Relation Age of Onset  ? Healthy Mother   ? Diabetes Father   ? Heart disease Father   ? Hypertension Father   ? Healthy Brother   ? Healthy Brother   ? Healthy Daughter   ? Healthy Son   ? Lung cancer Maternal Uncle   ? ?Past Surgical History:  ?Procedure Laterality Date  ? CARDIAC CATHETERIZATION    ? with stent  ? [place]  ? COLONOSCOPY  5-6 years ago   ? Done In [place]  ? ESOPHAGOGASTRODUODENOSCOPY (EGD) WITH PROPOFOL N/A  08/23/2017  ? Procedure: ESOPHAGOGASTRODUODENOSCOPY (EGD) WITH PROPOFOL;  Surgeon: Rogene Houston, MD;  [facility];  Service: Endoscopy;  Laterality: N/A;  ? Para Thyhoid  2005  ? PARTIAL HYSTERECTOMY    ? Patient states that she had 16 years ago?1998  ? UPPER GASTROINTESTINAL ENDOSCOPY    ? ? ?Short Social History:  ?Social History  ? ?Tobacco Use  ? Smoking status: Every Day  ?  Packs/day: 0.50  ?  Years: 44.00  ?  Pack years: 22.00  ?  Types: Cigarettes  ? Smokeless tobacco: Never  ? Tobacco comments:  ?  Patient states that she is down to 6 per week  ?Substance Use Topics  ? Alcohol use: No  ? ? ?Allergies  ?Allergen Reactions  ? Ampicillin Hives, Shortness Of Breath and Swelling  ?  Lips swelling ?Has patient had a PCN reaction causing immediate rash, facial/tongue/throat swelling, SOB or lightheadedness with hypotension: Yes ?Has patient had a PCN reaction causing severe rash involving mucus membranes or skin necrosis: No ?Has patient had a PCN reaction that required hospitalization: No ?Has patient had a PCN reaction occurring within the last 10 years: Yes ?If all of the above answers are "NO",  then may proceed with Cephalosporin use. ?  ? Carbamazepine   ?  Nausea, headache  ? Ciprofloxacin Hives  ? Dilantin [Phenytoin Sodium Extended]   ?  nausea  ? Sulfamethoxazole Hives  ? ? ?Current Outpatient Medications  ?Medication Sig Dispense Refill  ? albuterol (PROVENTIL) (2.5 MG/3ML) 0.083% nebulizer solution Take 3 mLs (2.5 mg total) by nebulization every 6 (six) hours as needed for wheezing or shortness of breath. 360 mL 5  ? albuterol (VENTOLIN HFA) 108 (90 Base) MCG/ACT inhaler Inhale 2 puffs into the lungs every 6 (six) hours as needed for wheezing or shortness of breath. 8 g 5  ? alum & mag hydroxide-simeth (MAALOX/MYLANTA) 220-254-27 MG/5ML suspension Take 15 mLs by mouth every 6 (six) hours as needed for indigestion or heartburn.    ? amLODipine (NORVASC) 5 MG tablet Take 5 mg by mouth  daily.    ? aspirin EC 81 MG tablet Take 81 mg by mouth daily.    ? atorvastatin (LIPITOR) 10 MG tablet Take 1 tablet by mouth daily.    ? BELBUCA 150 MCG FILM 450 mcg 2 (two) times daily.   0  ? buPROPion (ZYBAN) 150 MG 12 hr tablet Take 1 tablet (150 mg total) by mouth 2 (two) times daily. 60 tablet 3  ? Cholecalciferol (VITAMIN D PO) Take 1,000 Units by mouth daily.    ? dicyclomine (BENTYL) 10 MG capsule TAKE 1 CAPSULE (10 MG TOTAL) BY MOUTH 3 (THREE) TIMES DAILY AS NEEDED FOR SPASMS. 270 capsule 1  ? Fluticasone-Umeclidin-Vilant (TRELEGY ELLIPTA) 100-62.5-25 MCG/ACT AEPB Inhale 1 puff into the lungs daily. 1 each 11  ? gabapentin (NEURONTIN) 400 MG capsule Take 1,200 mg by mouth 3 (three) times daily.     ? levothyroxine (SYNTHROID) 100 MCG tablet Take 125 mcg by mouth daily before breakfast.     ? lisinopril (ZESTRIL) 40 MG tablet Take 40 mg by mouth daily.    ? metFORMIN (GLUCOPHAGE-XR) 500 MG 24 hr tablet Take 500 mg by mouth daily with breakfast.    ? methocarbamol (ROBAXIN) 500 MG tablet Take 500-750 mg by mouth 2 (two) times daily as needed for muscle spasms (takes 1 tablet in the morning and 1.5 tablet at bedtime). Takes a dose every morning and another dose at bedtime only if needed for pain    ? mirtazapine (REMERON) 15 MG tablet TAKE 1 TABLET BY MOUTH EVERY DAY AT NIGHT  2  ? montelukast (SINGULAIR) 10 MG tablet Take 1 tablet (10 mg total) by mouth at bedtime. 30 tablet 11  ? Multiple Vitamin (MULTI-VITAMINS) TABS Take 1 tablet by mouth daily.    ? naloxegol oxalate (MOVANTIK) 12.5 MG TABS tablet Take 1 tablet (12.5 mg total) by mouth daily. 90 tablet 3  ? naproxen sodium (ANAPROX) 220 MG tablet Take 440 mg by mouth daily as needed (pain).    ? ondansetron (ZOFRAN) 4 MG tablet Take 1 tablet (4 mg total) by mouth every 8 (eight) hours as needed for nausea or vomiting. Patient takes in the morning. 30 tablet 1  ? oxyCODONE (OXY IR/ROXICODONE) 5 MG immediate release tablet Take 10 mg by mouth every 8  (eight) hours as needed for moderate pain or severe pain (depends on pain level if takes 1-3 tablets).     ? pantoprazole (PROTONIX) 40 MG tablet TAKE 1 TABLET BY MOUTH EVERY DAY BEFORE BREAKFAST 90 tablet 1  ? promethazine (PHENERGAN) 25 MG tablet TAKE 1 TABLET BY MOUTH EVERY 8 HOURS AS NEEDED FOR  NAUSEA OR VOMITING. 30 tablet 0  ? vitamin B-12 (CYANOCOBALAMIN) 500 MCG tablet Take 1,000 mcg by mouth daily.     ? Wheat Dextrin (BENEFIBER DRINK MIX) PACK Take 4 g by mouth at bedtime. (Patient taking differently: Take 4 g by mouth at bedtime. prn)    ? propranolol (INDERAL) 10 MG tablet Take 10 mg by mouth 2 (two) times daily.     ? ?No current facility-administered medications for this visit.  ? ? ?Review of Systems  ?Constitutional:  Constitutional negative. ?HENT: HENT negative.  ?Eyes: Eyes negative.  ?Respiratory: Respiratory negative.  ?Cardiovascular: Cardiovascular negative.  ?GI: Gastrointestinal negative.  ?Musculoskeletal: Positive for leg pain.  ?Skin: Skin negative.  ?Neurological: Neurological negative. ?Hematologic: Hematologic/lymphatic negative.  ?Psychiatric: Psychiatric negative.   ? ?   ?Objective:  ?Objective  ?Vitals:  ? 01/31/22 1030  ?BP: (!) 185/102  ?Pulse: 65  ?Resp: 20  ?Temp: 98.4 ?F (36.9 ?C)  ?SpO2: 92%  ? ? ? ?Physical Exam ?HENT:  ?   Head: Normocephalic.  ?   Nose: Nose normal.  ?Eyes:  ?   Pupils: Pupils are equal, round, and reactive to light.  ?Cardiovascular:  ?   Rate and Rhythm: Normal rate.  ?   Pulses: Normal pulses.  ?Pulmonary:  ?   Effort: Pulmonary effort is normal.  ?Abdominal:  ?   General: Abdomen is flat.  ?   Palpations: Abdomen is soft. There is no mass.  ?Musculoskeletal:     ?   General: Normal range of motion.  ?   Cervical back: Normal range of motion.  ?   Right lower leg: No edema.  ?   Left lower leg: No edema.  ?   Comments: Pain in left groin   ?Skin: ?   General: Skin is warm and dry.  ?   Capillary Refill: Capillary refill takes less than 2 seconds.   ?Neurological:  ?   General: No focal deficit present.  ?   Mental Status: She is alert.  ?Psychiatric:     ?   Mood and Affect: Mood normal.     ?   Behavior: Behavior normal.     ?   Thought Content: Thought conten

## 2022-02-14 ENCOUNTER — Other Ambulatory Visit (INDEPENDENT_AMBULATORY_CARE_PROVIDER_SITE_OTHER): Payer: Self-pay | Admitting: Gastroenterology

## 2022-03-06 ENCOUNTER — Other Ambulatory Visit (INDEPENDENT_AMBULATORY_CARE_PROVIDER_SITE_OTHER): Payer: Self-pay | Admitting: Gastroenterology

## 2022-05-04 ENCOUNTER — Ambulatory Visit (INDEPENDENT_AMBULATORY_CARE_PROVIDER_SITE_OTHER): Payer: Medicare HMO | Admitting: Pulmonary Disease

## 2022-05-04 ENCOUNTER — Encounter: Payer: Self-pay | Admitting: Pulmonary Disease

## 2022-05-04 VITALS — BP 142/98 | HR 74 | Temp 97.8°F | Ht 70.5 in | Wt 220.0 lb

## 2022-05-04 DIAGNOSIS — J441 Chronic obstructive pulmonary disease with (acute) exacerbation: Secondary | ICD-10-CM | POA: Diagnosis not present

## 2022-05-04 DIAGNOSIS — J449 Chronic obstructive pulmonary disease, unspecified: Secondary | ICD-10-CM

## 2022-05-04 DIAGNOSIS — Z720 Tobacco use: Secondary | ICD-10-CM

## 2022-05-04 DIAGNOSIS — J432 Centrilobular emphysema: Secondary | ICD-10-CM | POA: Diagnosis not present

## 2022-05-04 MED ORDER — DOXYCYCLINE HYCLATE 100 MG PO TABS: 100.0000 mg | ORAL_TABLET | Freq: Two times a day (BID) | ORAL | 0 refills | Status: DC

## 2022-05-04 NOTE — Progress Notes (Signed)
[facility]  Chief Complaint  Patient presents with   Follow-up    Coughing the last two weeks.Increased SOB Using albuterol and nebulizer more frequently.     Past Surgical History:  She  has a past surgical history that includes Para Thyhoid (2005); Partial hysterectomy; Colonoscopy (5-6 years ago ); Upper gastrointestinal endoscopy; Cesarean section (1996); Cardiac catheterization; and Esophagogastroduodenoscopy (egd) with propofol (N/A, 08/23/2017).  Past Medical History:  Carpal tunnel, Constipation, Cirrhosis, Hep C, DJD, Depression, Fibromyalgia, Nephrolithiasis, HTN, Neuropathy, Hypothyroidism s/p partial thyroidectomy, COVID 12 June 2021  Constitutional:  BP (!) 142/98 (BP Location: Left Arm, Patient Position: Sitting)   Pulse 74   Temp 97.8 F (36.6 C) (Temporal)   Ht 5' 10.5" (1.791 m)   Wt 220 lb (99.8 kg)   SpO2 94% Comment: ra  BMI 31.12 kg/m   Brief Summary:  Teresa Benitez is a 66 y.o. female smoker with COPD, asthma, and lung nodule.       Subjective:   She started having more trouble with her breathing a couple of weeks ago.  Getting cough with thick, yellow sputum.  Has wheezing.  Had temperature 100F earlier this week.  Has sinus congestion and drainage.  Denies GI symptoms, skin rash, leg swelling.  Saw her PCP and started on prednisone.  Needing to use her albuterol 2 to 3 times per day.  Not feeling better yet.  Physical Exam:   Appearance - well kempt, frequent coughing spells  ENMT - no sinus tenderness, no oral exudate, no LAN, Mallampati 3 airway, no stridor, raspy voice  Respiratory - bilateral wheezing  CV - s1s2 regular rate and rhythm, no murmurs  Ext - no clubbing, no edema  Skin - no rashes  Psych - normal mood and affect      Pulmonary testing:  PFT 04/07/20 >> FEV1 2.04 (68%), FEV1% 72, TLC 5.33 (91%), DLCO 67%, +BD  Chest Imaging:  CT chest 05/17/21 >> atherosclerosis, 10 x 6  mm GGO nodule RUL, calcified granulomas, basilar scarring, changes of cirrhosis, Rt adrenal adenoma  Social History:  She  reports that she has been smoking cigarettes. She has a 22.00 pack-year smoking history. She has never used smokeless tobacco. She reports that she does not drink alcohol and does not use drugs.  Family History:  Her family history includes Diabetes in her father; Healthy in her brother, brother, daughter, mother, and son; Heart disease in her father; Hypertension in her father; Lung cancer in her maternal uncle.      Assessment/Plan:   COPD exacerbation. - continue prednisone from her PCP - will give her a course of doxycycline - continue albuterol nebulizer bid to tid until symptoms resolve - if she is not improving, then might need hospitalization with IV steroids and antibiotics  COPD with asthma. - continue trelegy 100 one puff daily, singulair - she has a nebulizer  Tobacco abuse. - encouraged her to keep up with smoking cessation efforts  Rt upper lobe ground glass nodule. - she will need to be schedule for follow up CT chest w/o contrast in August 2024  Cirrhosis with hx of Hepatitis C. - followed by Dr. Montez Morita with gastroenterology   Time Spent Involved in Patient Care on Day of Examination:  38 minutes  Follow up:   Patient Instructions  Doxycycline antibiotic 100 mg pill twice per day for 7 days  Finish course of prednisone  Call if not improving by next week  Follow up in 4 months  Medication List:   Allergies as of 05/04/2022       Reactions   Ampicillin Hives, Shortness Of Breath, Swelling   Lips swelling Has patient had a PCN reaction causing immediate rash, facial/tongue/throat swelling, SOB or lightheadedness with hypotension: Yes Has patient had a PCN reaction causing severe rash involving mucus membranes or skin necrosis: No Has patient had a PCN reaction that required hospitalization: No Has patient had a PCN  reaction occurring within the last 10 years: Yes If all of the above answers are "NO", then may proceed with Cephalosporin use.   Carbamazepine    Nausea, headache   Ciprofloxacin Hives   Dilantin [phenytoin Sodium Extended]    nausea   Sulfamethoxazole Hives        Medication List        Accurate as of May 04, 2022 11:57 AM. If you have any questions, ask your nurse or doctor.          albuterol 108 (90 Base) MCG/ACT inhaler Commonly known as: VENTOLIN HFA Inhale 2 puffs into the lungs every 6 (six) hours as needed for wheezing or shortness of breath.   albuterol (2.5 MG/3ML) 0.083% nebulizer solution Commonly known as: PROVENTIL Take 3 mLs (2.5 mg total) by nebulization every 6 (six) hours as needed for wheezing or shortness of breath.   alum & mag hydroxide-simeth 200-200-20 MG/5ML suspension Commonly known as: MAALOX/MYLANTA Take 15 mLs by mouth every 6 (six) hours as needed for indigestion or heartburn.   amLODipine 5 MG tablet Commonly known as: NORVASC Take 5 mg by mouth daily.   aspirin EC 81 MG tablet Take 81 mg by mouth daily.   atorvastatin 10 MG tablet Commonly known as: LIPITOR Take 1 tablet by mouth daily.   Belbuca 150 MCG Film Generic drug: Buprenorphine HCl 450 mcg 2 (two) times daily.   Benefiber Drink Mix Pack Take 4 g by mouth at bedtime. What changed: additional instructions   buPROPion 150 MG 12 hr tablet Commonly known as: ZYBAN Take 1 tablet (150 mg total) by mouth 2 (two) times daily.   dicyclomine 10 MG capsule Commonly known as: BENTYL TAKE 1 CAPSULE (10 MG TOTAL) BY MOUTH 3 (THREE) TIMES DAILY AS NEEDED FOR SPASMS.   doxycycline 100 MG tablet Commonly known as: VIBRA-TABS Take 1 tablet (100 mg total) by mouth 2 (two) times daily. Started by: Chesley Mires, MD   gabapentin 400 MG capsule Commonly known as: NEURONTIN Take 1,200 mg by mouth 3 (three) times daily.   levothyroxine 100 MCG tablet Commonly known as:  SYNTHROID Take 125 mcg by mouth daily before breakfast.   lisinopril 40 MG tablet Commonly known as: ZESTRIL Take 40 mg by mouth daily.   metFORMIN 500 MG 24 hr tablet Commonly known as: GLUCOPHAGE-XR Take 500 mg by mouth daily with breakfast.   methocarbamol 500 MG tablet Commonly known as: ROBAXIN Take 500-750 mg by mouth 2 (two) times daily as needed for muscle spasms (takes 1 tablet in the morning and 1.5 tablet at bedtime). Takes a dose every morning and another dose at bedtime only if needed for pain   mirtazapine 15 MG tablet Commonly known as: REMERON TAKE 1 TABLET BY MOUTH EVERY DAY AT NIGHT   montelukast 10 MG tablet Commonly known as: SINGULAIR Take 1 tablet (10 mg total) by mouth at bedtime.   Multi-Vitamins Tabs Take 1 tablet by mouth daily.   naloxegol oxalate 12.5 MG Tabs tablet Commonly known  as: Movantik Take 1 tablet (12.5 mg total) by mouth daily.   naproxen sodium 220 MG tablet Commonly known as: ALEVE Take 440 mg by mouth daily as needed (pain).   ondansetron 4 MG tablet Commonly known as: ZOFRAN Take 1 tablet (4 mg total) by mouth every 8 (eight) hours as needed for nausea or vomiting. Patient takes in the morning.   oxyCODONE 5 MG immediate release tablet Commonly known as: Oxy IR/ROXICODONE Take 10 mg by mouth every 8 (eight) hours as needed for moderate pain or severe pain (depends on pain level if takes 1-3 tablets).   pantoprazole 40 MG tablet Commonly known as: PROTONIX TAKE 1 TABLET BY MOUTH EVERY DAY BEFORE BREAKFAST   predniSONE 20 MG tablet Commonly known as: DELTASONE Take 2 tabs by mouth daily x 3 days, take 1 tab by mouth daily x 3 days, take 1/2 tab by mouth daily x 4 days   promethazine 25 MG tablet Commonly known as: PHENERGAN TAKE 1 TABLET BY MOUTH EVERY 8 HOURS AS NEEDED FOR NAUSEA OR VOMITING.   propranolol 10 MG tablet Commonly known as: INDERAL Take 10 mg by mouth 2 (two) times daily.   Trelegy Ellipta 100-62.5-25  MCG/ACT Aepb Generic drug: Fluticasone-Umeclidin-Vilant Inhale 1 puff into the lungs daily.   vitamin B-12 500 MCG tablet Commonly known as: CYANOCOBALAMIN Take 1,000 mcg by mouth daily.   VITAMIN D PO Take 1,000 Units by mouth daily.        Signature:  Chesley Mires, MD [facility] Pager - [phone] 05/04/2022, 11:57 AM

## 2022-05-04 NOTE — Patient Instructions (Signed)
Doxycycline antibiotic 100 mg pill twice per day for 7 days  Finish course of prednisone  Call if not improving by next week  Follow up in 4 months

## 2022-06-16 ENCOUNTER — Other Ambulatory Visit (INDEPENDENT_AMBULATORY_CARE_PROVIDER_SITE_OTHER): Payer: Self-pay | Admitting: Gastroenterology

## 2022-06-23 IMAGING — US US ABDOMEN LIMITED
1 series · 14 of 25 positions shown · non-contrast
Comparison: Right upper quadrant ultrasound dated 04/13/2020. CT
abdomen pelvis dated 10/05/2020.

CLINICAL DATA: Cirrhosis.

EXAM:
ULTRASOUND ABDOMEN LIMITED RIGHT UPPER QUADRANT

[Series 1: us abdomen limited · 0.21mm/px · 116 acquisitions, 14 frames shown]
[im 1/116]
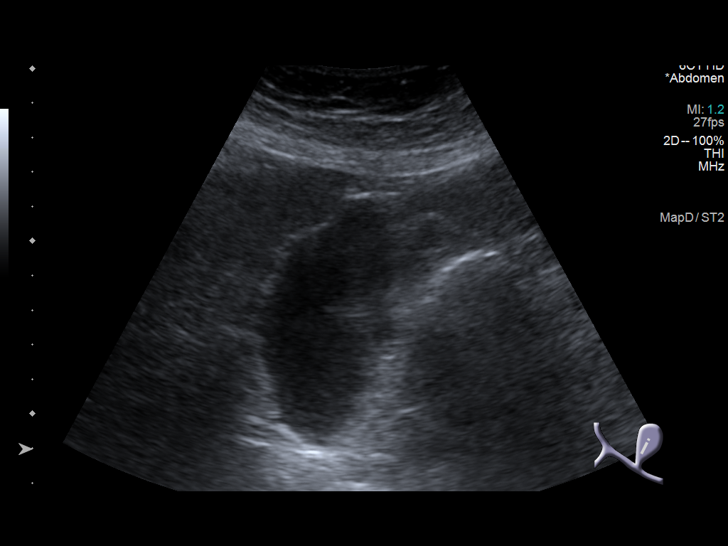
[im 10/116]
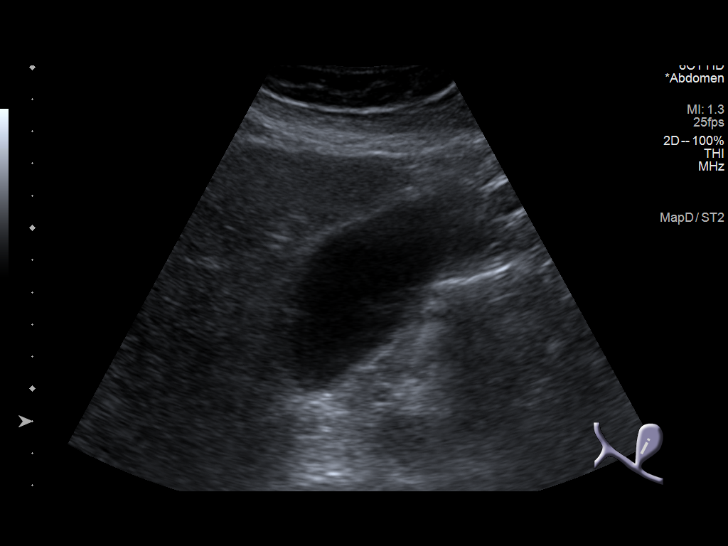
[im 20/116]
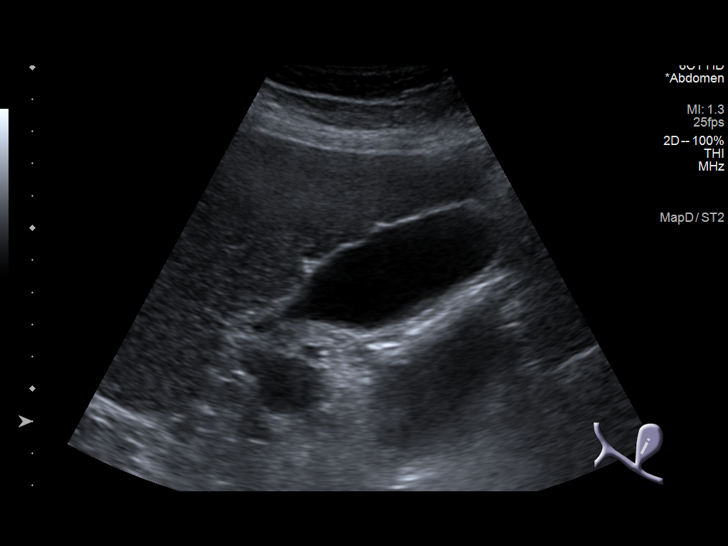
[im 29/116]
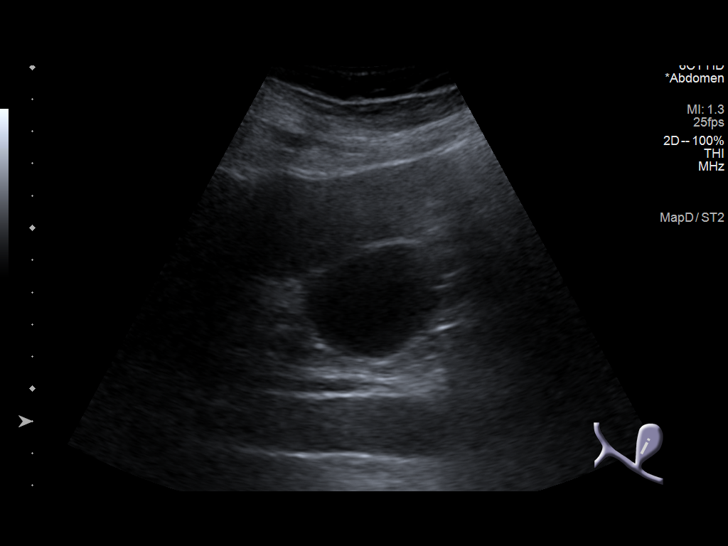
[im 39/116]
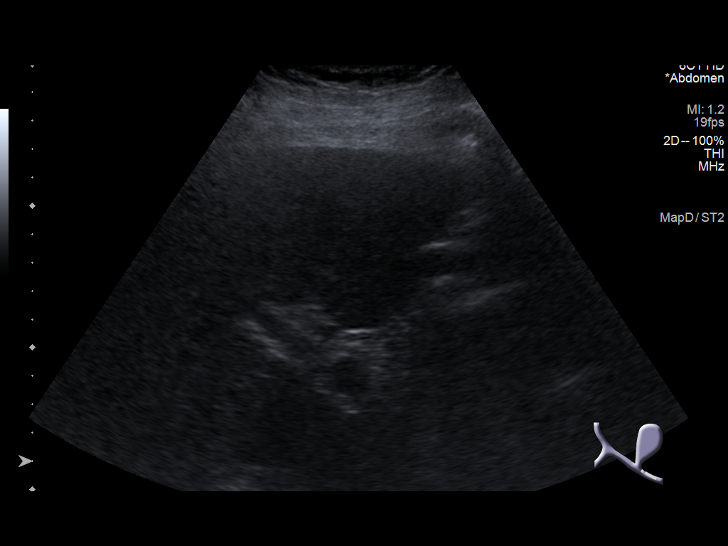
[im 44/116]
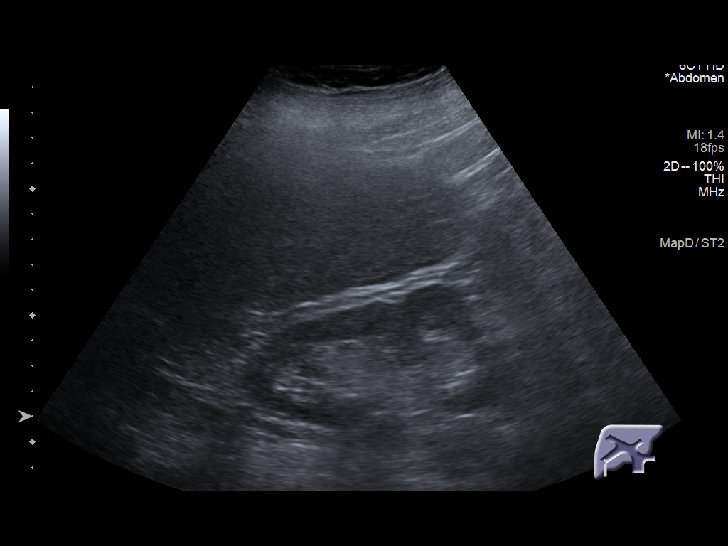
[im 53/116]
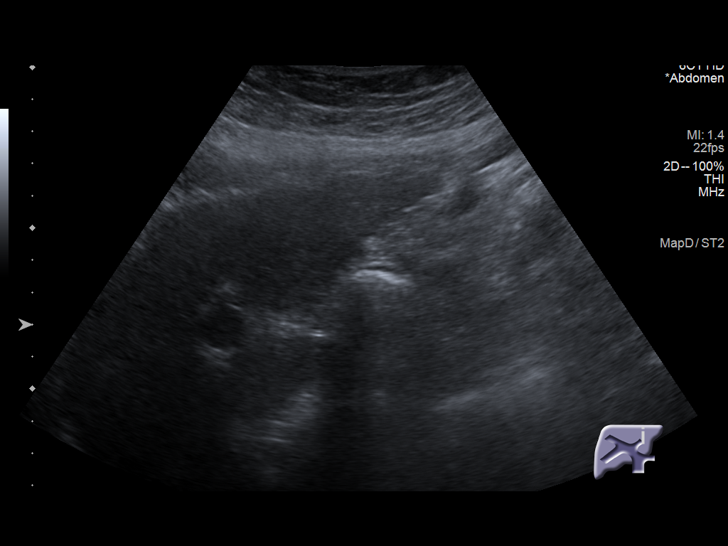
[im 63/116]
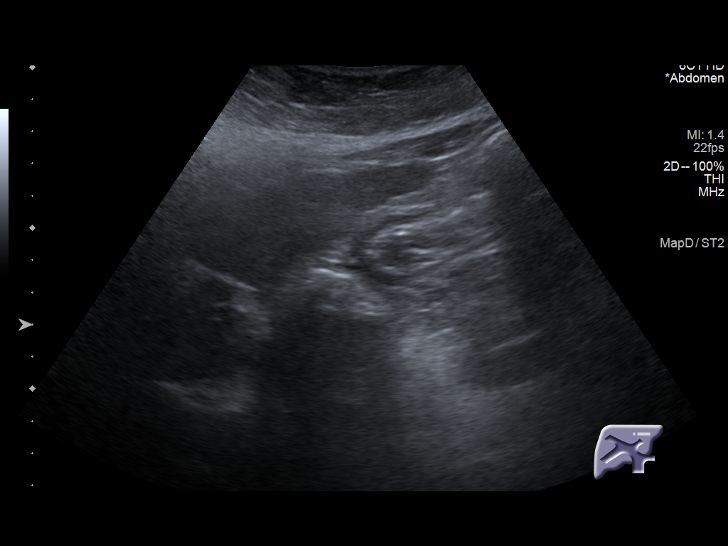
[im 72/116]
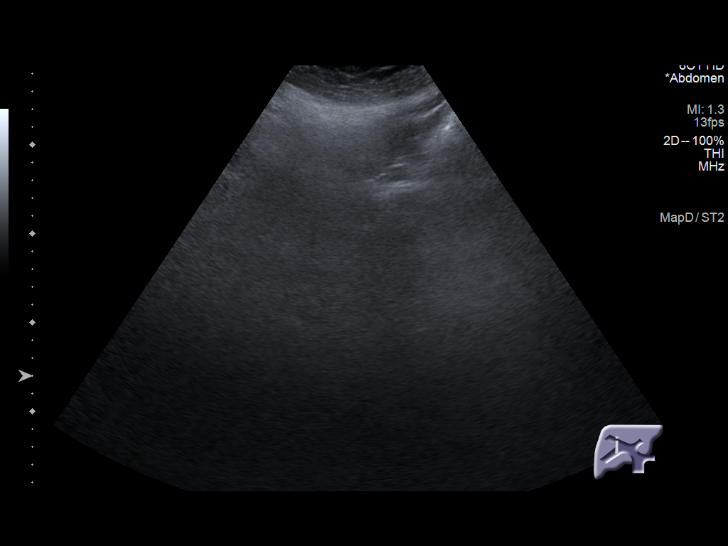
[im 77/116]
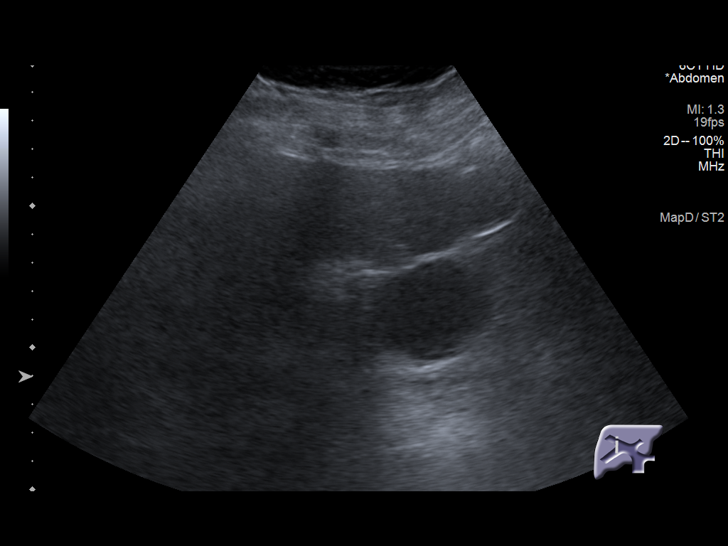
[im 87/116]
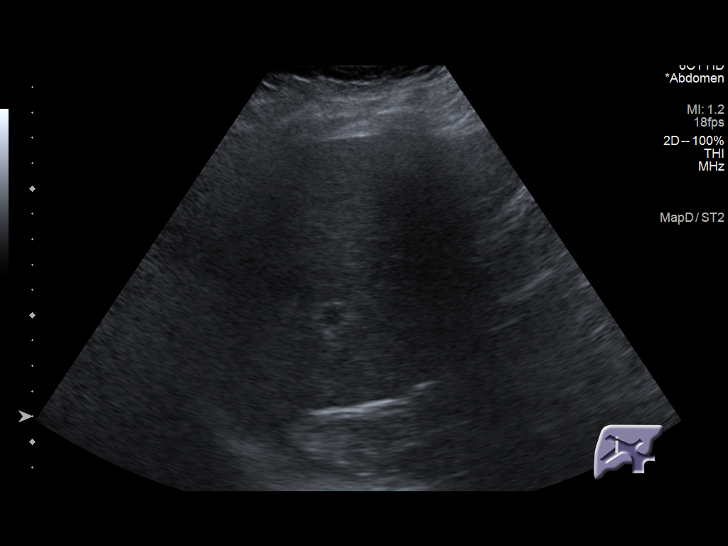
[im 96/116]
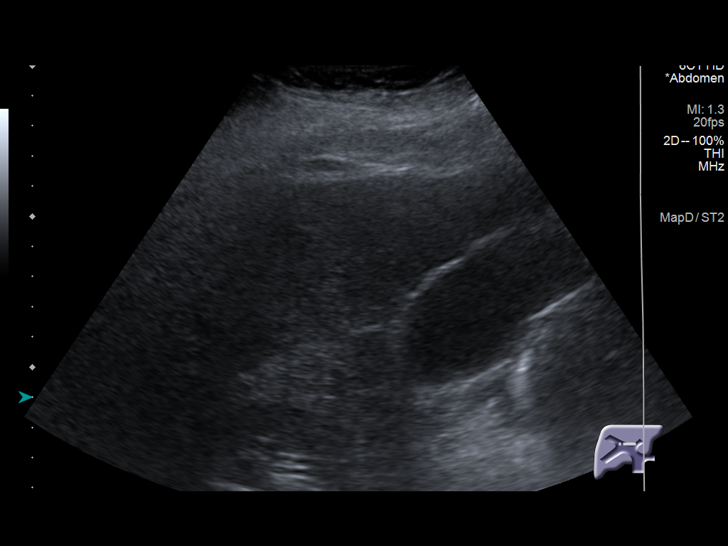
[im 106/116]
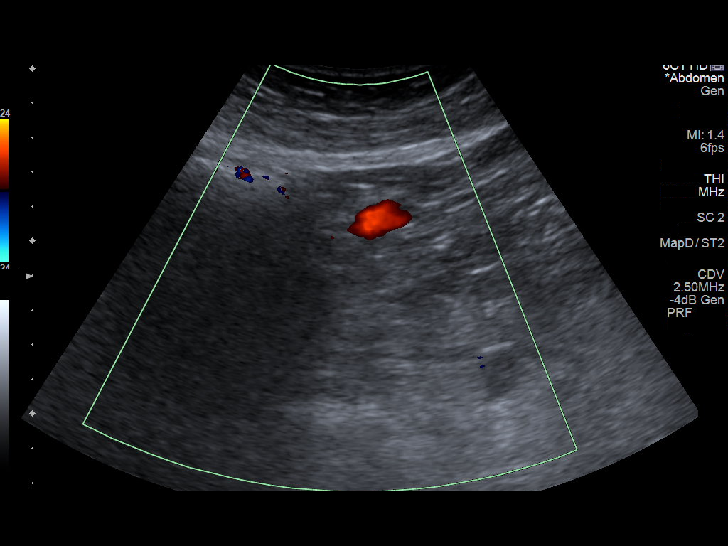
[im 116/116]
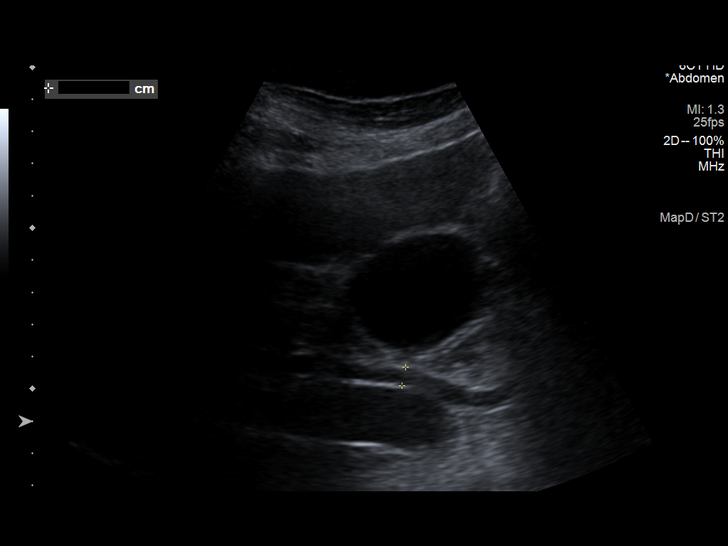

[14 of 25 positions shown; findings below may reference images not displayed]

FINDINGS: Gallbladder:

There is a stone within the gallbladder. No gallbladder wall
thickening or pericholecystic fluid. Negative sonographic Murphy's
sign.

Common bile duct:

Diameter: 6 mm

Liver:

Morphologic changes of cirrhosis. No discrete mass identified.
Portal vein is patent on color Doppler imaging with normal direction
of blood flow towards the liver.

Other: None.
IMPRESSION: 1. Cholelithiasis without sonographic evidence of acute
cholecystitis.
2. Cirrhosis.
3. Patent main portal vein with hepatopetal flow.
4. No ascites.

## 2022-07-04 ENCOUNTER — Ambulatory Visit (INDEPENDENT_AMBULATORY_CARE_PROVIDER_SITE_OTHER): Payer: Medicare HMO | Admitting: Vascular Surgery

## 2022-07-04 ENCOUNTER — Encounter: Payer: Self-pay | Admitting: Vascular Surgery

## 2022-07-04 VITALS — BP 181/82 | HR 67 | Temp 97.5°F | Ht 70.5 in | Wt 221.6 lb

## 2022-07-04 DIAGNOSIS — I7143 Infrarenal abdominal aortic aneurysm, without rupture: Secondary | ICD-10-CM

## 2022-07-04 DIAGNOSIS — I7113 Aneurysm of the descending thoracic aorta, ruptured: Secondary | ICD-10-CM

## 2022-07-04 NOTE — Progress Notes (Signed)
[facility]  Patient name: Teresa Benitez MRN: [id number] DOB: 05/01/1956 Sex: female  REASON FOR VISIT: Discuss aortic aneurysm  HPI: Teresa Benitez is a 66 y.o. female here today for discussion of aortic aneurysm he has a complicated past history including cirrhosis and hepatitis C.  She recently underwent abdominal CT for evaluation of abdominal discomfort.  She was noted to have thoracic and abdominal aortic aneurysm and is seen today for further discussion of this.  These have been seen on prior examinations.  Her father had a abdominal aortic aneurysm.  Past Medical History:  Diagnosis Date   Carpal tunnel syndrome, bilateral 08/26/2018   Chest pain    negative myoview 02/14/2015   Chronic constipation    Cirrhosis ([place])    COPD (chronic obstructive pulmonary disease) ([place])    COVID-19 05/2021   DDD (degenerative disc disease), lumbar    Depression    Fibromyalgia    Hepatitis C 2004   treated, none since 2016   History of kidney stones    Hypertension    Hypothyroidism    Peripheral neuropathy 08/26/2018   Spleen enlarged    Thrombocytopenia ([place])    Vision abnormalities     Family History  Problem Relation Age of Onset   Healthy Mother    Diabetes Father    Heart disease Father    Hypertension Father    Healthy Brother    Healthy Brother    Healthy Daughter    Healthy Son    Lung cancer Maternal Uncle     SOCIAL HISTORY: Social History   Tobacco Use   Smoking status: Every Day    Packs/day: 0.50    Years: 44.00    Total pack years: 22.00    Types: Cigarettes   Smokeless tobacco: Never   Tobacco comments:    Patient states that she is down to 6 per week  Substance Use Topics   Alcohol use: No    Allergies  Allergen Reactions   Ampicillin Hives, Shortness Of Breath and Swelling    Lips swelling Has patient had a PCN reaction causing immediate rash, facial/tongue/throat swelling,  SOB or lightheadedness with hypotension: Yes Has patient had a PCN reaction causing severe rash involving mucus membranes or skin necrosis: No Has patient had a PCN reaction that required hospitalization: No Has patient had a PCN reaction occurring within the last 10 years: Yes If all of the above answers are "NO", then may proceed with Cephalosporin use.    Carbamazepine     Nausea, headache   Ciprofloxacin Hives   Dilantin [Phenytoin Sodium Extended]     nausea   Sulfamethoxazole Hives    Current Outpatient Medications  Medication Sig Dispense Refill   albuterol (PROVENTIL) (2.5 MG/3ML) 0.083% nebulizer solution Take 3 mLs (2.5 mg total) by nebulization every 6 (six) hours as needed for wheezing or shortness of breath. 360 mL 5   albuterol (VENTOLIN HFA) 108 (90 Base) MCG/ACT inhaler Inhale 2 puffs into the lungs every 6 (six) hours as needed for wheezing or shortness of breath. 8 g 5   alum & mag hydroxide-simeth (MAALOX/MYLANTA) 200-200-20 MG/5ML suspension Take 15 mLs by mouth every 6 (six) hours as needed for indigestion or heartburn.     amLODipine (NORVASC) 5 MG tablet Take 5 mg by mouth daily.     aspirin EC 81 MG tablet Take 81 mg by mouth daily.     atorvastatin (LIPITOR) 10 MG tablet Take 1 tablet  by mouth daily.     BELBUCA 150 MCG FILM 450 mcg 2 (two) times daily.   0   Cholecalciferol (VITAMIN D PO) Take 1,000 Units by mouth daily.     dicyclomine (BENTYL) 10 MG capsule TAKE 1 CAPSULE (10 MG TOTAL) BY MOUTH 3 (THREE) TIMES DAILY AS NEEDED FOR SPASMS. 270 capsule 1   doxycycline (VIBRA-TABS) 100 MG tablet Take 1 tablet (100 mg total) by mouth 2 (two) times daily. 14 tablet 0   Fluticasone-Umeclidin-Vilant (TRELEGY ELLIPTA) 100-62.5-25 MCG/ACT AEPB Inhale 1 puff into the lungs daily. 1 each 11   gabapentin (NEURONTIN) 400 MG capsule Take 1,200 mg by mouth 3 (three) times daily.      levothyroxine (SYNTHROID) 100 MCG tablet Take 125 mcg by mouth daily before breakfast.       lisinopril (ZESTRIL) 40 MG tablet Take 40 mg by mouth daily.     methocarbamol (ROBAXIN) 500 MG tablet Take 500-750 mg by mouth 2 (two) times daily as needed for muscle spasms (takes 1 tablet in the morning and 1.5 tablet at bedtime). Takes a dose every morning and another dose at bedtime only if needed for pain     mirtazapine (REMERON) 15 MG tablet TAKE 1 TABLET BY MOUTH EVERY DAY AT NIGHT  2   montelukast (SINGULAIR) 10 MG tablet Take 1 tablet (10 mg total) by mouth at bedtime. 30 tablet 11   Multiple Vitamin (MULTI-VITAMINS) TABS Take 1 tablet by mouth daily.     naloxegol oxalate (MOVANTIK) 12.5 MG TABS tablet Take 1 tablet (12.5 mg total) by mouth daily. 90 tablet 3   naproxen sodium (ANAPROX) 220 MG tablet Take 440 mg by mouth daily as needed (pain).     ondansetron (ZOFRAN) 4 MG tablet Take 1 tablet (4 mg total) by mouth every 8 (eight) hours as needed for nausea or vomiting. Patient takes in the morning. 30 tablet 1   oxyCODONE (OXY IR/ROXICODONE) 5 MG immediate release tablet Take 10 mg by mouth every 8 (eight) hours as needed for moderate pain or severe pain (depends on pain level if takes 1-3 tablets).      pantoprazole (PROTONIX) 40 MG tablet TAKE 1 TABLET BY MOUTH EVERY DAY BEFORE BREAKFAST 90 tablet 1   predniSONE (DELTASONE) 20 MG tablet Take 2 tabs by mouth daily x 3 days, take 1 tab by mouth daily x 3 days, take 1/2 tab by mouth daily x 4 days     promethazine (PHENERGAN) 25 MG tablet TAKE 1 TABLET BY MOUTH EVERY 8 HOURS AS NEEDED FOR NAUSEA AND VOMITING 30 tablet 0   vitamin B-12 (CYANOCOBALAMIN) 500 MCG tablet Take 1,000 mcg by mouth daily.      Wheat Dextrin (BENEFIBER DRINK MIX) PACK Take 4 g by mouth at bedtime. (Patient taking differently: Take 4 g by mouth at bedtime. prn)     buPROPion (ZYBAN) 150 MG 12 hr tablet Take 1 tablet (150 mg total) by mouth 2 (two) times daily. (Patient not taking: Reported on 07/04/2022) 60 tablet 3   metFORMIN (GLUCOPHAGE-XR) 500 MG 24 hr tablet  Take 500 mg by mouth daily with breakfast.     propranolol (INDERAL) 10 MG tablet Take 10 mg by mouth 2 (two) times daily.      No current facility-administered medications for this visit.    REVIEW OF SYSTEMS:  '[X]'$  denotes positive finding, '[ ]'$  denotes negative finding Cardiac  Comments:  Chest pain or chest pressure:    Shortness of breath upon exertion:  Short of breath when lying flat:    Irregular heart rhythm:        Vascular    Pain in calf, thigh, or hip brought on by ambulation: x Sciatic nerve left  Pain in feet at night that wakes you up from your sleep:     Blood clot in your veins:    Leg swelling:           PHYSICAL EXAM: Vitals:   07/04/22 1511  BP: (!) 181/82  Pulse: 67  Temp: (!) 97.5 F (36.4 C)  Weight: 221 lb 9.6 oz (100.5 kg)  Height: 5' 10.5" (1.791 m)    GENERAL: The patient is a well-nourished female, in no acute distress. The vital signs are documented above. CARDIOVASCULAR: 2+ radial 2+ popliteal and 2+ dorsalis pedis pulses bilaterally with evidence of peripheral artery aneurysm. PULMONARY: There is good air exchange  MUSCULOSKELETAL: There are no major deformities or cyanosis. NEUROLOGIC: No focal weakness or paresthesias are detected. SKIN: There are no ulcers or rashes noted. PSYCHIATRIC: The patient has a normal affect.  DATA:  06/18/2022.  This was of her chest and abdomen.  She does have a 3 cm infrarenal abdominal aortic aneurysm which has not been changed since her prior study in 2019.  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  MEDICAL ISSUES: Had a long discussion with the patient regarding her very small infrarenal aneurysm and her moderate distal thoracic aneurysm.  I have recommended that we see her again in 1 year with repeat CT scan of her chest abdomen and pelvis for follow-up.  Discussed treatment option to include stent graft repair.  I did explain the  difficulty in the repair in the thoracoabdominal region due to visceral and renal vessels.  I discussed symptoms of leaking aneurysm and the need to report immediately to the emergency room via 911.  We will see her again in 1 year with CT    Rosetta Posner, MD [facility] [phone]  Note: Portions of this report may have been transcribed using voice recognition software.  Every effort has been made to ensure accuracy; however, inadvertent computerized transcription errors may still be present.

## 2022-07-09 ENCOUNTER — Encounter (INDEPENDENT_AMBULATORY_CARE_PROVIDER_SITE_OTHER): Payer: Self-pay | Admitting: Gastroenterology

## 2022-07-09 ENCOUNTER — Ambulatory Visit (INDEPENDENT_AMBULATORY_CARE_PROVIDER_SITE_OTHER): Payer: Medicare HMO | Admitting: Gastroenterology

## 2022-07-09 VITALS — BP 152/86 | HR 76 | Temp 98.1°F | Ht 70.5 in | Wt 220.5 lb

## 2022-07-09 DIAGNOSIS — K7460 Unspecified cirrhosis of liver: Secondary | ICD-10-CM | POA: Diagnosis not present

## 2022-07-09 DIAGNOSIS — K760 Fatty (change of) liver, not elsewhere classified: Secondary | ICD-10-CM | POA: Diagnosis not present

## 2022-07-09 DIAGNOSIS — K589 Irritable bowel syndrome without diarrhea: Secondary | ICD-10-CM | POA: Diagnosis not present

## 2022-07-09 DIAGNOSIS — Z79891 Long term (current) use of opiate analgesic: Secondary | ICD-10-CM | POA: Diagnosis not present

## 2022-07-09 NOTE — Progress Notes (Unsigned)
Maylon Peppers, M.D. Gastroenterology & Hepatology [facility] [address] [place], [place] [address]  Primary Care Physician: Leeanne Rio, MD [address] [place] [address]  I will communicate my assessment and recommendations to the referring MD via EMR.  Problems: Liver cirrhosis due to hepatitis C Hepatitis C, achieved SVR after treatment with DAA (unknown regimen) Opioid-induced constipation  History of Present Illness: Teresa Benitez is a 67 y.o. female with PMH diabetes, liver cirrhosis due to hepatitis C status post treatment with DAA and achieved SVR, fibromyalgia, hypothyroidism, chronic constipation, depression, who presents for follow up of cirrhosis.  The patient was last seen on 01/08/2022. At that time, the patient was ordered to have MELD labs, AFP, liver ultrasound and CBC.  She was advised to call back to schedule her EGD and colonoscopy, but she never called back.  She was continued on Movantik 12.5 mg every day.  Patient reports feeling well. Occasionally feels some nausea but does not vomit, usually improves after she has a BM.  The patient denies having any fever, chills, hematochezia, melena, hematemesis, abdominal distention, abdominal pain, diarrhea, jaundice, pruritus or weight loss.  She states that she does not have someone to bring her for the EGD and colonoscopy, so she will need to postpone her procedures.  Takes oxycodone at least once a day for back pain.  CT of abdomen and pelvis without contrast was performed 05/07/2022, which showed a low-density lesion in the posterior segment of the right lobe of the liver.  A subsequent CT of the abdomen did not show any abnormalities in the liver, although this was not a liver protocol.   Cirrhosis related questions: Hematemesis/coffee ground emesis: No History of variceal bleeding: No Abdominal pain: No Abdominal  distention/worsening ascites: No, although scan have some blaoting that improves with Benefiber Fever/chills: No Episodes of confusion/disorientation: No Number of daily bowel movements:every day once Taking diuretics?: No Prior history of banding?: No Prior episodes of SBP: No Last time liver imaging was performed:01/29/2022 no liver masses in [place], normal Doppler MELD score: 01/29/2022 - 7   Last EGD: 07/2017 - Normal esophagus. - Z-line regular, 42 cm from the incisors. - Portal hypertensive gastropathy. - Gastric antral vascular ectasia without bleeding. - Non-bleeding erosive gastropathy. - Normal duodenal bulb and second portion of the duodenum. - No specimens collected.   Last Colonoscopy: 09/2015, removal of polyp, recommended to have repeat surveillance colonoscopy in jan 2022  Past Medical History: Past Medical History:  Diagnosis Date   Carpal tunnel syndrome, bilateral 08/26/2018   Chest pain    negative myoview 02/14/2015   Chronic constipation    Cirrhosis ([place])    COPD (chronic obstructive pulmonary disease) ([place])    COVID-19 05/2021   DDD (degenerative disc disease), lumbar    Depression    Fibromyalgia    Hepatitis C 2004   treated, none since 2016   History of kidney stones    Hypertension    Hypothyroidism    Peripheral neuropathy 08/26/2018   Spleen enlarged    Thrombocytopenia ([place])    Vision abnormalities     Past Surgical History: Past Surgical History:  Procedure Laterality Date   CARDIAC CATHETERIZATION     with stent   [place]  5-6 years ago    Done In [place]   ESOPHAGOGASTRODUODENOSCOPY (EGD) WITH PROPOFOL N/A 08/23/2017   Procedure: ESOPHAGOGASTRODUODENOSCOPY (EGD) WITH PROPOFOL;  Surgeon: Hildred Laser  U, MD;  Location: [facility];  Service: Endoscopy;  Laterality: N/A;   Para Thyhoid  2005   PARTIAL HYSTERECTOMY     Patient states that she had 16 years ago?1998   UPPER GASTROINTESTINAL ENDOSCOPY       Family History: Family History  Problem Relation Age of Onset   Healthy Mother    Diabetes Father    Heart disease Father    Hypertension Father    Healthy Brother    Healthy Brother    Healthy Daughter    Healthy Son    Lung cancer Maternal Uncle     Social History: Social History   Tobacco Use  Smoking Status Every Day   Packs/day: 0.50   Years: 44.00   Total pack years: 22.00   Types: Cigarettes  Smokeless Tobacco Never  Tobacco Comments   Patient states that she is down to 6 per week   Social History   Substance and Sexual Activity  Alcohol Use No   Social History   Substance and Sexual Activity  Drug Use No    Allergies: Allergies  Allergen Reactions   Ampicillin Hives, Shortness Of Breath and Swelling    Lips swelling Has patient had a PCN reaction causing immediate rash, facial/tongue/throat swelling, SOB or lightheadedness with hypotension: Yes Has patient had a PCN reaction causing severe rash involving mucus membranes or skin necrosis: No Has patient had a PCN reaction that required hospitalization: No Has patient had a PCN reaction occurring within the last 10 years: Yes If all of the above answers are "NO", then may proceed with Cephalosporin use.    Carbamazepine     Nausea, headache   Ciprofloxacin Hives   Dilantin [Phenytoin Sodium Extended]     nausea   Sulfamethoxazole Hives    Medications: Current Outpatient Medications  Medication Sig Dispense Refill   albuterol (PROVENTIL) (2.5 MG/3ML) 0.083% nebulizer solution Take 3 mLs (2.5 mg total) by nebulization every 6 (six) hours as needed for wheezing or shortness of breath. 360 mL 5   albuterol (VENTOLIN HFA) 108 (90 Base) MCG/ACT inhaler Inhale 2 puffs into the lungs every 6 (six) hours as needed for wheezing or shortness of breath. 8 g 5   alum & mag hydroxide-simeth (MAALOX/MYLANTA) 200-200-20 MG/5ML suspension Take 15 mLs by mouth every 6 (six) hours as needed for indigestion or  heartburn.     amLODipine (NORVASC) 5 MG tablet Take 5 mg by mouth daily.     aspirin EC 81 MG tablet Take 81 mg by mouth daily.     atorvastatin (LIPITOR) 10 MG tablet Take 1 tablet by mouth daily.     BELBUCA 150 MCG FILM 450 mcg 2 (two) times daily.   0   Cholecalciferol (VITAMIN D PO) Take 1,000 Units by mouth daily.     dicyclomine (BENTYL) 10 MG capsule TAKE 1 CAPSULE (10 MG TOTAL) BY MOUTH 3 (THREE) TIMES DAILY AS NEEDED FOR SPASMS. 270 capsule 1   Fluticasone-Umeclidin-Vilant (TRELEGY ELLIPTA) 100-62.5-25 MCG/ACT AEPB Inhale 1 puff into the lungs daily. 1 each 11   gabapentin (NEURONTIN) 400 MG capsule Take 1,200 mg by mouth 3 (three) times daily.      levothyroxine (SYNTHROID) 100 MCG tablet Take 125 mcg by mouth daily before breakfast.      lisinopril (ZESTRIL) 40 MG tablet Take 40 mg by mouth daily.     metFORMIN (GLUCOPHAGE-XR) 500 MG 24 hr tablet Take 500 mg by mouth daily with breakfast.  methocarbamol (ROBAXIN) 500 MG tablet Take 500-750 mg by mouth 2 (two) times daily as needed for muscle spasms (takes 1 tablet in the morning and 1.5 tablet at bedtime). Takes a dose every morning and another dose at bedtime only if needed for pain     mirtazapine (REMERON) 15 MG tablet TAKE 1 TABLET BY MOUTH EVERY DAY AT NIGHT  2   montelukast (SINGULAIR) 10 MG tablet Take 1 tablet (10 mg total) by mouth at bedtime. 30 tablet 11   Multiple Vitamin (MULTI-VITAMINS) TABS Take 1 tablet by mouth daily.     naloxegol oxalate (MOVANTIK) 12.5 MG TABS tablet Take 1 tablet (12.5 mg total) by mouth daily. 90 tablet 3   naproxen sodium (ANAPROX) 220 MG tablet Take 440 mg by mouth daily as needed (pain).     ondansetron (ZOFRAN) 4 MG tablet Take 1 tablet (4 mg total) by mouth every 8 (eight) hours as needed for nausea or vomiting. Patient takes in the morning. 30 tablet 1   oxyCODONE (OXY IR/ROXICODONE) 5 MG immediate release tablet Take 10 mg by mouth every 8 (eight) hours as needed for moderate pain or  severe pain (depends on pain level if takes 1-3 tablets).      pantoprazole (PROTONIX) 40 MG tablet TAKE 1 TABLET BY MOUTH EVERY DAY BEFORE BREAKFAST 90 tablet 1   promethazine (PHENERGAN) 25 MG tablet TAKE 1 TABLET BY MOUTH EVERY 8 HOURS AS NEEDED FOR NAUSEA AND VOMITING 30 tablet 0   propranolol (INDERAL) 10 MG tablet Take 10 mg by mouth 2 (two) times daily.      vitamin B-12 (CYANOCOBALAMIN) 500 MCG tablet Take 1,000 mcg by mouth daily.      Wheat Dextrin (BENEFIBER DRINK MIX) PACK Take 4 g by mouth at bedtime. (Patient taking differently: Take 4 g by mouth at bedtime. prn)     No current facility-administered medications for this visit.    Review of Systems: GENERAL: negative for malaise, night sweats HEENT: No changes in hearing or vision, no nose bleeds or other nasal problems. NECK: Negative for lumps, goiter, pain and significant neck swelling RESPIRATORY: Negative for cough, wheezing CARDIOVASCULAR: Negative for chest pain, leg swelling, palpitations, orthopnea GI: SEE HPI MUSCULOSKELETAL: Negative for joint pain or swelling, back pain, and muscle pain. SKIN: Negative for lesions, rash PSYCH: Negative for sleep disturbance, mood disorder and recent psychosocial stressors. HEMATOLOGY Negative for prolonged bleeding, bruising easily, and swollen nodes. ENDOCRINE: Negative for cold or heat intolerance, polyuria, polydipsia and goiter. NEURO: negative for tremor, gait imbalance, syncope and seizures. The remainder of the review of systems is noncontributory.   Physical Exam: BP (!) 152/86 (BP Location: Left Arm, Patient Position: Sitting, Cuff Size: Large)   Pulse 76   Temp 98.1 F (36.7 C) (Oral)   Ht 5' 10.5" (1.791 m)   Wt 220 lb 8 oz (100 kg)   BMI 31.19 kg/m  GENERAL: The patient is AO x3, in no acute distress. HEENT: Head is normocephalic and atraumatic. EOMI are intact. Mouth is well hydrated and without lesions. NECK: Supple. No masses LUNGS: Clear to auscultation.  No presence of rhonchi/wheezing/rales. Adequate chest expansion HEART: RRR, normal s1 and s2. ABDOMEN: Soft, nontender, no guarding, no peritoneal signs, and nondistended. BS +. No masses. EXTREMITIES: Without any cyanosis, clubbing, rash, lesions or edema. NEUROLOGIC: AOx3, no focal motor deficit. SKIN: no jaundice, no rashes  Imaging/Labs: as above  I personally reviewed and interpreted the available labs, imaging and endoscopic files.  Impression and  Plan: Teresa Benitez is a 66 y.o. female with PMH diabetes, liver cirrhosis due to hepatitis C status post treatment with DAA and achieved SVR, fibromyalgia, hypothyroidism, chronic constipation, depression, who presents for follow up of cirrhosis.  The patient has no presented any decompensating event and has had a low MELD score.  We will update MELD labs today.  Regarding [place] screening, we will proceed with an MRI of the liver as there was a questionable lesion in the CT of the abdomen without IV contrast a month ago.  For his constipation, he can continue with the use of Movantik and MiraLAX at previous doses.  Finally, she show proceed with an EGD for variceal screening and repeat colonoscopy but she will update [place] once he has a ride for the procedure.  - Check CBC, MELD labs and AFP - Schedule liver MRI - Patient to call us back to schedule EGD and colonoscopy when she is able to set up a ride - Continue Bentyl as needed for abdominal pain - Continue Movantik 12.5 mg every day - Can take 1 capful Miralax daily - Reduce salt intake to <2 g per day - Can take Tylenol max of 2 g per day (650 mg q8h) for pain - Avoid NSAIDs for pain - Avoid eating raw oysters/shellfish - Protein shake (Ensure or Boost) every night before going to sleep - RTC 6 months  All questions were answered.      Maylon Peppers, MD Gastroenterology and Hepatology [facility]

## 2022-07-09 NOTE — Patient Instructions (Signed)
-   Check CBC, MELD labs and AFP - Schedule liver US - Patient to call us back to schedule EGD and colonoscopy when she is able to set up a ride - Continue Bentyl as needed for abdominal pain - Continue Movantik 12.5 mg every day - Can take 1 capful Miralax daily - Reduce salt intake to <2 g per day - Can take Tylenol max of 2 g per day (650 mg q8h) for pain - Avoid NSAIDs for pain - Avoid eating raw oysters/shellfish - Protein shake (Ensure or Boost) every night before going to sleep - RTC 6 months

## 2022-07-11 LAB — COMPREHENSIVE METABOLIC PANEL
AG Ratio: 1 (calc) (ref 1.0–2.5)
ALT: 13 U/L (ref 6–29)
AST: 21 U/L (ref 10–35)
Albumin: 3.8 g/dL (ref 3.6–5.1)
Alkaline phosphatase (APISO): 127 U/L (ref 37–153)
BUN: 11 mg/dL (ref 7–25)
CO2: 35 mmol/L — ABNORMAL HIGH (ref 20–32)
Calcium: 9.8 mg/dL (ref 8.6–10.4)
Chloride: 101 mmol/L (ref 98–110)
Creat: 0.59 mg/dL (ref 0.50–1.05)
Globulin: 3.7 g/dL (calc) (ref 1.9–3.7)
Glucose, Bld: 134 mg/dL (ref 65–139)
Potassium: 3.9 mmol/L (ref 3.5–5.3)
Sodium: 142 mmol/L (ref 135–146)
Total Bilirubin: 0.5 mg/dL (ref 0.2–1.2)
Total Protein: 7.5 g/dL (ref 6.1–8.1)

## 2022-07-11 LAB — CBC WITH DIFFERENTIAL/PLATELET
Absolute Monocytes: 390 cells/uL (ref 200–950)
Basophils Absolute: 30 cells/uL (ref 0–200)
Basophils Relative: 0.5 %
Eosinophils Absolute: 132 cells/uL (ref 15–500)
Eosinophils Relative: 2.2 %
HCT: 39.7 % (ref 35.0–45.0)
Hemoglobin: 13.1 g/dL (ref 11.7–15.5)
Lymphs Abs: 1488 cells/uL (ref 850–3900)
MCH: 27.3 pg (ref 27.0–33.0)
MCHC: 33 g/dL (ref 32.0–36.0)
MCV: 82.9 fL (ref 80.0–100.0)
MPV: 11.8 fL (ref 7.5–12.5)
Monocytes Relative: 6.5 %
Neutro Abs: 3960 cells/uL (ref 1500–7800)
Neutrophils Relative %: 66 %
Platelets: 139 10*3/uL — ABNORMAL LOW (ref 140–400)
RBC: 4.79 10*6/uL (ref 3.80–5.10)
RDW: 15.2 % — ABNORMAL HIGH (ref 11.0–15.0)
Total Lymphocyte: 24.8 %
WBC: 6 10*3/uL (ref 3.8–10.8)

## 2022-07-11 LAB — AFP TUMOR MARKER: AFP-Tumor Marker: 3.1 ng/mL

## 2022-07-11 LAB — PROTIME-INR
INR: 1
Prothrombin Time: 10.3 s (ref 9.0–11.5)

## 2022-07-30 ENCOUNTER — Ambulatory Visit (HOSPITAL_COMMUNITY): Payer: Medicare HMO

## 2022-08-01 ENCOUNTER — Other Ambulatory Visit (INDEPENDENT_AMBULATORY_CARE_PROVIDER_SITE_OTHER): Payer: Self-pay | Admitting: Gastroenterology

## 2022-08-01 NOTE — Telephone Encounter (Signed)
Last seen 07/09/22.

## 2022-08-06 ENCOUNTER — Encounter (INDEPENDENT_AMBULATORY_CARE_PROVIDER_SITE_OTHER): Payer: Self-pay | Admitting: Gastroenterology

## 2022-08-06 ENCOUNTER — Ambulatory Visit (HOSPITAL_COMMUNITY)
Admission: RE | Admit: 2022-08-06 | Discharge: 2022-08-06 | Disposition: A | Discharge status: Home / Self Care | Payer: Medicare HMO | Source: Ambulatory Visit | Attending: Gastroenterology | Admitting: Gastroenterology

## 2022-08-06 DIAGNOSIS — K7460 Unspecified cirrhosis of liver: Secondary | ICD-10-CM | POA: Diagnosis not present

## 2022-08-06 DIAGNOSIS — K760 Fatty (change of) liver, not elsewhere classified: Secondary | ICD-10-CM | POA: Diagnosis present

## 2022-08-06 MED ORDER — GADOBUTROL 1 MMOL/ML IV SOLN
9.0000 mL | Freq: Once | INTRAVENOUS | Status: AC | PRN
  Administered 2022-08-06: 9 mL via INTRAVENOUS

## 2022-09-13 ENCOUNTER — Other Ambulatory Visit (INDEPENDENT_AMBULATORY_CARE_PROVIDER_SITE_OTHER): Payer: Self-pay | Admitting: Gastroenterology

## 2022-09-21 ENCOUNTER — Other Ambulatory Visit: Payer: Self-pay | Admitting: Pulmonary Disease

## 2022-10-03 ENCOUNTER — Ambulatory Visit (INDEPENDENT_AMBULATORY_CARE_PROVIDER_SITE_OTHER): Payer: Medicare HMO | Admitting: Pulmonary Disease

## 2022-10-03 ENCOUNTER — Encounter: Payer: Self-pay | Admitting: Pulmonary Disease

## 2022-10-03 VITALS — BP 130/72 | HR 78 | Temp 97.6°F | Ht 70.5 in | Wt 223.8 lb

## 2022-10-03 DIAGNOSIS — R0683 Snoring: Secondary | ICD-10-CM

## 2022-10-03 DIAGNOSIS — R911 Solitary pulmonary nodule: Secondary | ICD-10-CM | POA: Diagnosis not present

## 2022-10-03 DIAGNOSIS — Z720 Tobacco use: Secondary | ICD-10-CM

## 2022-10-03 DIAGNOSIS — J4489 Other specified chronic obstructive pulmonary disease: Secondary | ICD-10-CM

## 2022-10-03 NOTE — Progress Notes (Signed)
[facility]  Chief Complaint  Patient presents with   Follow-up    Had Left breast surgery a week ago. Has appt with oncology today.  Feels she is overall doing okay with breathing     Past Surgical History:  She  has a past surgical history that includes Para Thyhoid (2005); Partial hysterectomy; Colonoscopy (5-6 years ago ); Upper gastrointestinal endoscopy; Cesarean section (1996); Cardiac catheterization; and Esophagogastroduodenoscopy (egd) with propofol (N/A, 08/23/2017).  Past Medical History:  Carpal tunnel, Constipation, Cirrhosis, Hep C, DJD, Depression, Fibromyalgia, Nephrolithiasis, HTN, Neuropathy, Hypothyroidism s/p partial thyroidectomy, COVID 12 June 2021, Lt breast cancer December 2023  Constitutional:  BP 130/72   Pulse 78   Temp 97.6 F (36.4 C)   Ht 5' 10.5" (1.791 m)   Wt 223 lb 12.8 oz (101.5 kg)   SpO2 96% Comment: ra  BMI 31.66 kg/m   Brief Summary:  Teresa Benitez is a 67 y.o. female smoker with COPD, asthma, and lung nodule.       Subjective:   She had routine mammogram in December.  Found to have a lump in her left breast.  Had lumpectomy last week.  Has follow up with oncology later today.  Her daughter was staying with her around the time she had surgery.  She was told that she snores and stops breathing at night.  She has been told previously that she might need assessment for sleep apnea.  She is a restless sleeper, and wakes up several times to use the bathroom.  Not having sinus congestion, cough, wheeze, or chest congestion.  Trelegy helps.  Doesn't need to use albuterol much.  Physical Exam:   Appearance - well kempt   ENMT - no sinus tenderness, no oral exudate, no LAN, Mallampati 3 airway, no stridor  Respiratory - equal breath sounds bilaterally, no wheezing or rales  CV - s1s2 regular rate and rhythm, no murmurs  Ext - no clubbing, no edema  Skin - no rashes  Psych - normal  mood and affect      Pulmonary testing:  PFT 04/07/20 >> FEV1 2.04 (68%), FEV1% 72, TLC 5.33 (91%), DLCO 67%, +BD  Chest Imaging:  CT chest 05/17/21 >> atherosclerosis, 10 x 6 mm GGO nodule RUL, calcified granulomas, basilar scarring, changes of cirrhosis, Rt adrenal adenoma  Social History:  She  reports that she has been smoking cigarettes. She has a 22.00 pack-year smoking history. She has never used smokeless tobacco. She reports that she does not drink alcohol and does not use drugs.  Family History:  Her family history includes Diabetes in her father; Healthy in her brother, brother, daughter, mother, and son; Heart disease in her father; Hypertension in her father; Lung cancer in her maternal uncle.      Assessment/Plan:   COPD with asthma. - continue trelegy 100 one puff daily, singulair 10 mg nightly - prn albuterol - she has a nebulizer  Snoring. - associated with sleep disruption, apnea, and daytime sleepiness - she has history of hypertension and depression - concern she could have obstructive sleep apnea - will arrange for home sleep study  Tobacco abuse. - encouraged her to keep up with smoking cessation efforts  Rt upper lobe ground glass nodule. - follow up CT chest w/o contrast in July 2024  Cirrhosis with hx of Hepatitis C. - followed by Dr. Montez Morita with gastroenterology   Lt breast ductal carcinoma in situ. - f/p Lt breast lumpectomy in  January 2024 - has follow up with oncology later today  Time Spent Involved in Patient Care on Day of Examination:  37 minutes  Follow up:   Patient Instructions  Will arrange for home sleep study and call with results  Follow up in 6 months  Medication List:   Allergies as of 10/03/2022       Reactions   Ampicillin Hives, Shortness Of Breath, Swelling   Lips swelling Has patient had a PCN reaction causing immediate rash, facial/tongue/throat swelling, SOB or lightheadedness with hypotension:  Yes Has patient had a PCN reaction causing severe rash involving mucus membranes or skin necrosis: No Has patient had a PCN reaction that required hospitalization: No Has patient had a PCN reaction occurring within the last 10 years: Yes If all of the above answers are "NO", then may proceed with Cephalosporin use.   Carbamazepine    Nausea, headache   Ciprofloxacin Hives   Dilantin [phenytoin Sodium Extended]    nausea   Sulfamethoxazole Hives        Medication List        Accurate as of October 03, 2022 10:57 AM. If you have any questions, ask your nurse or doctor.          albuterol 108 (90 Base) MCG/ACT inhaler Commonly known as: VENTOLIN HFA Inhale 2 puffs into the lungs every 6 (six) hours as needed for wheezing or shortness of breath.   albuterol (2.5 MG/3ML) 0.083% nebulizer solution Commonly known as: PROVENTIL Take 3 mLs (2.5 mg total) by nebulization every 6 (six) hours as needed for wheezing or shortness of breath.   alum & mag hydroxide-simeth 200-200-20 MG/5ML suspension Commonly known as: MAALOX/MYLANTA Take 15 mLs by mouth every 6 (six) hours as needed for indigestion or heartburn.   amLODipine 5 MG tablet Commonly known as: NORVASC Take 5 mg by mouth daily.   aspirin EC 81 MG tablet Take 81 mg by mouth daily.   atorvastatin 10 MG tablet Commonly known as: LIPITOR Take 1 tablet by mouth daily.   Belbuca 150 MCG Film Generic drug: Buprenorphine HCl 450 mcg 2 (two) times daily.   Benefiber Drink Mix Pack Take 4 g by mouth at bedtime. What changed: additional instructions   dicyclomine 10 MG capsule Commonly known as: BENTYL TAKE 1 CAPSULE (10 MG TOTAL) BY MOUTH 3 (THREE) TIMES DAILY AS NEEDED FOR SPASMS.   gabapentin 400 MG capsule Commonly known as: NEURONTIN Take 1,200 mg by mouth 3 (three) times daily.   levothyroxine 100 MCG tablet Commonly known as: SYNTHROID Take 125 mcg by mouth daily before breakfast.   lisinopril 40 MG  tablet Commonly known as: ZESTRIL Take 40 mg by mouth daily.   metFORMIN 500 MG 24 hr tablet Commonly known as: GLUCOPHAGE-XR Take 500 mg by mouth daily with breakfast.   methocarbamol 500 MG tablet Commonly known as: ROBAXIN Take 500-750 mg by mouth 2 (two) times daily as needed for muscle spasms (takes 1 tablet in the morning and 1.5 tablet at bedtime). Takes a dose every morning and another dose at bedtime only if needed for pain   mirtazapine 15 MG tablet Commonly known as: REMERON TAKE 1 TABLET BY MOUTH EVERY DAY AT NIGHT   montelukast 10 MG tablet Commonly known as: SINGULAIR Take 1 tablet (10 mg total) by mouth at bedtime.   Movantik 12.5 MG Tabs tablet Generic drug: naloxegol oxalate TAKE 1 TABLET BY MOUTH EVERY DAY   Multi-Vitamins Tabs Take 1 tablet by mouth daily.  naproxen sodium 220 MG tablet Commonly known as: ALEVE Take 440 mg by mouth daily as needed (pain).   ondansetron 4 MG tablet Commonly known as: ZOFRAN Take 1 tablet (4 mg total) by mouth every 8 (eight) hours as needed for nausea or vomiting. Patient takes in the morning.   oxyCODONE 5 MG immediate release tablet Commonly known as: Oxy IR/ROXICODONE Take 10 mg by mouth every 8 (eight) hours as needed for moderate pain or severe pain (depends on pain level if takes 1-3 tablets).   pantoprazole 40 MG tablet Commonly known as: PROTONIX TAKE 1 TABLET BY MOUTH EVERY DAY BEFORE BREAKFAST   promethazine 25 MG tablet Commonly known as: PHENERGAN TAKE 1 TABLET BY MOUTH EVERY 8 HOURS AS NEEDED FOR NAUSEA AND VOMITING   propranolol 10 MG tablet Commonly known as: INDERAL Take 10 mg by mouth 2 (two) times daily.   Trelegy Ellipta 100-62.5-25 MCG/ACT Aepb Generic drug: Fluticasone-Umeclidin-Vilant TAKE 1 PUFF BY MOUTH EVERY DAY   vitamin B-12 500 MCG tablet Commonly known as: CYANOCOBALAMIN Take 1,000 mcg by mouth daily.   VITAMIN D PO Take 1,000 Units by mouth daily.        Signature:   Chesley Mires, MD [place] Pager - [phone] 10/03/2022, 10:57 AM

## 2022-10-03 NOTE — Patient Instructions (Signed)
Will arrange for home sleep study and call with results  Follow up in 6 months

## 2022-11-05 ENCOUNTER — Other Ambulatory Visit (INDEPENDENT_AMBULATORY_CARE_PROVIDER_SITE_OTHER): Payer: Self-pay | Admitting: Gastroenterology

## 2022-11-05 ENCOUNTER — Other Ambulatory Visit: Payer: Self-pay | Admitting: Pulmonary Disease

## 2022-11-16 ENCOUNTER — Other Ambulatory Visit: Payer: Self-pay | Admitting: Pulmonary Disease

## 2022-12-16 ENCOUNTER — Other Ambulatory Visit: Payer: Self-pay | Admitting: Pulmonary Disease

## 2022-12-16 ENCOUNTER — Other Ambulatory Visit: Payer: Self-pay | Admitting: Family Medicine

## 2022-12-24 ENCOUNTER — Telehealth: Payer: Self-pay | Admitting: Pulmonary Disease

## 2022-12-24 NOTE — Telephone Encounter (Signed)
Please have her scheduled for next available ROV with me or an NP in the next week or two.  Okay to double book visit with me at [facility] on 12/25/22.

## 2022-12-24 NOTE — Telephone Encounter (Signed)
Called and spoke to patient and offered appt tomorrow 12/25/22. Patient declined and states she is too weak to leave her house right now. Does not have access to Mychart  Scheduled patient for appt on 01/02/23 in [place] with [facility] NP. Patient was unsure if she would be able to go to [place] if she is feeling sick still but advised her to call us and let us know if that was the case and we  will notify Dr. Halford Chessman so we can come up with another plan.  Nothing further needed for now.

## 2022-12-24 NOTE — Telephone Encounter (Signed)
Received call from primary [facility] (Dr. Sherrine Maples, follows for left breast cancer, estrogen receptor positive) regarding patients reports of shortness of breath & cough.  Has multiple allergies > PCN, Cipro, Bactrim, Ampicillin.  Has recently been on a Z-Pak. Pt continues to complain of cough, shortness of breath despite treatment.  Started on prednisone and second Z-Pak.  Requesting patient to be seen 4/2 if possible at [place].  Reviewed with Dr. Halford Chessman.      Teresa Gens, MSN, APRN, NP-C, AGACNP-BC [facility] 12/24/2022, 2:58 PM   Please see [email or web address] for pager details.   From 7A-7P if no response, please call [phone] After hours, please call ELink [phone]

## 2023-01-02 ENCOUNTER — Ambulatory Visit: Payer: Medicare HMO | Admitting: Nurse Practitioner

## 2023-01-07 ENCOUNTER — Encounter (HOSPITAL_BASED_OUTPATIENT_CLINIC_OR_DEPARTMENT_OTHER): Payer: Self-pay | Admitting: Pulmonary Disease

## 2023-01-07 ENCOUNTER — Ambulatory Visit (INDEPENDENT_AMBULATORY_CARE_PROVIDER_SITE_OTHER): Payer: Medicare HMO | Admitting: Pulmonary Disease

## 2023-01-07 VITALS — BP 142/72 | HR 93 | Temp 98.3°F | Ht 70.5 in | Wt 217.0 lb

## 2023-01-07 DIAGNOSIS — R052 Subacute cough: Secondary | ICD-10-CM

## 2023-01-07 DIAGNOSIS — R911 Solitary pulmonary nodule: Secondary | ICD-10-CM | POA: Diagnosis not present

## 2023-01-07 DIAGNOSIS — J4489 Other specified chronic obstructive pulmonary disease: Secondary | ICD-10-CM

## 2023-01-07 DIAGNOSIS — R0683 Snoring: Secondary | ICD-10-CM

## 2023-01-07 MED ORDER — PREDNISONE 10 MG PO TABS: ORAL_TABLET | ORAL | 0 refills | Status: AC

## 2023-01-07 MED ORDER — DOXYCYCLINE HYCLATE 100 MG PO TABS
100.0000 mg | ORAL_TABLET | Freq: Two times a day (BID) | ORAL | 0 refills | Status: DC
Start: 2023-01-07 — End: 2023-08-08

## 2023-01-07 NOTE — Patient Instructions (Signed)
Doxycycline 100 mg twice per day for 7 days.  Prednisone 10 mg pill >> 2 pills daily for 3 days, 1 pill daily for 3 days, 1/2 pill daily for 4 days.  Follow up in 3 months.

## 2023-01-07 NOTE — Progress Notes (Signed)
[facility]  Chief Complaint  Patient presents with   Follow-up    Follow up.     Past Surgical History:  She  has a past surgical history that includes Para Thyhoid (2005); Partial hysterectomy; Colonoscopy (5-6 years ago ); Upper gastrointestinal endoscopy; Cesarean section (1996); Cardiac catheterization; and Esophagogastroduodenoscopy (egd) with propofol (N/A, 08/23/2017).  Past Medical History:  Carpal tunnel, Constipation, Cirrhosis, Hep C, DJD, Depression, Fibromyalgia, Nephrolithiasis, HTN, Neuropathy, Hypothyroidism s/p partial thyroidectomy, COVID 12 June 2021, Lt breast cancer December 2023  Constitutional:  BP (!) 142/72 (BP Location: Right Arm, Patient Position: Sitting, Cuff Size: Normal)   Pulse 93   Temp 98.3 F (36.8 C) (Oral)   Ht 5' 10.5" (1.791 m)   Wt 217 lb (98.4 kg)   SpO2 94%   BMI 30.70 kg/m   Brief Summary:  Teresa Benitez is a 67 y.o. female smoker with COPD, asthma, and lung nodule.       Subjective:   She was started on chemotherapy for breast cancer.    She developed a cough about 10 weeks ago.  Has been on course of prednisone and zithromax.  These helped, but didn't completely clear her symptoms.  Has clear to yellow sputum.  No hemoptysis.  No fever, but she has been getting sweats at night.  Uses albuterol and this helps.    Physical Exam:   Appearance - well kempt   ENMT - no sinus tenderness, no oral exudate, no LAN, Mallampati 3 airway, no stridor  Respiratory - equal breath sounds bilaterally, no wheezing or rales  CV - s1s2 regular rate and rhythm, no murmurs  Ext - no clubbing, no edema  Skin - no rashes  Psych - normal mood and affect      Pulmonary testing:  PFT 04/07/20 >> FEV1 2.04 (68%), FEV1% 72, TLC 5.33 (91%), DLCO 67%, +BD  Chest Imaging:  CT chest 05/17/21 >> atherosclerosis, 10 x 6 mm GGO nodule RUL, calcified granulomas, basilar scarring, changes of  cirrhosis, Rt adrenal adenoma CT chest 01/03/23 >> mild CM 3V coronary calcification, descending thoracic aorta 4.4 x 3.9 cm, 7 mm GGO RUL stable, new patchy GGO RUL, cirrhosis of liver  Cardiac testing:  Echo 01/02/23 >> EF greater than 55%, mod MR, mild/mod AR, mod LA dilation  Social History:  She  reports that she has been smoking cigarettes. She has a 22.00 pack-year smoking history. She has never used smokeless tobacco. She reports that she does not drink alcohol and does not use drugs.  Family History:  Her family history includes Diabetes in her father; Healthy in her brother, brother, daughter, mother, and son; Heart disease in her father; Hypertension in her father; Lung cancer in her maternal uncle.      Assessment/Plan:   Subacute cough. - likely had viral pneumonia with bacterial superinfection with new area of ground glass opacification on CT chest  - has partial improvement with prior courses of prednisone and antibiotics - will get her extension to her therapy with doxycycline and prednisone  COPD with asthma. - continue trelegy 100 one puff daily, singulair 10 mg nightly - prn albuterol - she has a nebulizer  Tachycardia. - she was recently found to have intermittent atrial fibrillation and has holter monitor set up  Snoring. - associated with sleep disruption, apnea, and daytime sleepiness - she has history of hypertension and depression - concern she could have obstructive sleep apnea - sleep study is pending  Tobacco abuse. - encouraged her to keep up with smoking cessation efforts  Rt upper lobe ground glass nodule. - will need follow up CT chest later this Summer  Cirrhosis with hx of Hepatitis C. - followed by Dr. Marguerita Merles with gastroenterology   Stage 1A infiltrating ductal carcinoma of Lt breast. - f/p Lt breast lumpectomy in January 2024 - followed by oncology with [facility] in [place] - if she improves by later this week after extending course  of antibiotics and prednisone, then I think she should be able to resume chemotherapy later this month  Time Spent Involved in Patient Care on Day of Examination:  37 minutes  Follow up:   Patient Instructions  Doxycycline 100 mg twice per day for 7 days.  Prednisone 10 mg pill >> 2 pills daily for 3 days, 1 pill daily for 3 days, 1/2 pill daily for 4 days.  Follow up in 3 months.  Medication List:   Allergies as of 01/07/2023       Reactions   Ampicillin Hives, Shortness Of Breath, Swelling   Lips swelling Has patient had a PCN reaction causing immediate rash, facial/tongue/throat swelling, SOB or lightheadedness with hypotension: Yes Has patient had a PCN reaction causing severe rash involving mucus membranes or skin necrosis: No Has patient had a PCN reaction that required hospitalization: No Has patient had a PCN reaction occurring within the last 10 years: Yes If all of the above answers are "NO", then may proceed with Cephalosporin use.   Carbamazepine    Nausea, headache   Ciprofloxacin Hives   Dilantin [phenytoin Sodium Extended]    nausea   Sulfamethoxazole Hives        Medication List        Accurate as of January 07, 2023  4:30 PM. If you have any questions, ask your nurse or doctor.          albuterol 108 (90 Base) MCG/ACT inhaler Commonly known as: VENTOLIN HFA Inhale 2 puffs into the lungs every 6 (six) hours as needed for wheezing or shortness of breath.   albuterol (2.5 MG/3ML) 0.083% nebulizer solution Commonly known as: PROVENTIL Take 3 mLs (2.5 mg total) by nebulization every 6 (six) hours as needed for wheezing or shortness of breath.   alum & mag hydroxide-simeth 200-200-20 MG/5ML suspension Commonly known as: MAALOX/MYLANTA Take 15 mLs by mouth every 6 (six) hours as needed for indigestion or heartburn.   amLODipine 5 MG tablet Commonly known as: NORVASC Take 5 mg by mouth daily.   aspirin EC 81 MG tablet Take 81 mg by mouth daily.    atorvastatin 10 MG tablet Commonly known as: LIPITOR Take 1 tablet by mouth daily.   Belbuca 150 MCG Film Generic drug: Buprenorphine HCl 450 mcg 2 (two) times daily.   Benefiber Drink Mix Pack Take 4 g by mouth at bedtime. What changed: additional instructions   dicyclomine 10 MG capsule Commonly known as: BENTYL TAKE 1 CAPSULE (10 MG TOTAL) BY MOUTH 3 (THREE) TIMES DAILY AS NEEDED FOR SPASMS.   doxycycline 100 MG tablet Commonly known as: VIBRA-TABS Take 1 tablet (100 mg total) by mouth 2 (two) times daily. Started by: Coralyn Helling, MD   gabapentin 400 MG capsule Commonly known as: NEURONTIN Take 1,200 mg by mouth 3 (three) times daily.   levothyroxine 100 MCG tablet Commonly known as: SYNTHROID Take 125 mcg by mouth daily before breakfast.   lisinopril 40 MG tablet Commonly known as: ZESTRIL Take 40 mg by  mouth daily.   metFORMIN 500 MG 24 hr tablet Commonly known as: GLUCOPHAGE-XR Take 500 mg by mouth daily with breakfast.   methocarbamol 500 MG tablet Commonly known as: ROBAXIN Take 500-750 mg by mouth 2 (two) times daily as needed for muscle spasms (takes 1 tablet in the morning and 1.5 tablet at bedtime). Takes a dose every morning and another dose at bedtime only if needed for pain   metoprolol tartrate 25 MG tablet Commonly known as: LOPRESSOR Take by mouth.   mirtazapine 15 MG tablet Commonly known as: REMERON TAKE 1 TABLET BY MOUTH EVERY DAY AT NIGHT   montelukast 10 MG tablet Commonly known as: SINGULAIR TAKE 1 TABLET BY MOUTH EVERYDAY AT BEDTIME   Movantik 12.5 MG Tabs tablet Generic drug: naloxegol oxalate TAKE 1 TABLET BY MOUTH EVERY DAY   Multi-Vitamins Tabs Take 1 tablet by mouth daily.   naproxen sodium 220 MG tablet Commonly known as: ALEVE Take 440 mg by mouth daily as needed (pain).   ondansetron 4 MG tablet Commonly known as: ZOFRAN Take 1 tablet (4 mg total) by mouth every 8 (eight) hours as needed for nausea or vomiting.  Patient takes in the morning.   oxyCODONE 5 MG immediate release tablet Commonly known as: Oxy IR/ROXICODONE Take 10 mg by mouth every 8 (eight) hours as needed for moderate pain or severe pain (depends on pain level if takes 1-3 tablets).   pantoprazole 40 MG tablet Commonly known as: PROTONIX TAKE 1 TABLET BY MOUTH EVERY DAY BEFORE BREAKFAST   predniSONE 10 MG tablet Commonly known as: DELTASONE Take 2 tablets (20 mg total) by mouth daily with breakfast for 3 days, THEN 1 tablet (10 mg total) daily with breakfast for 3 days, THEN 0.5 tablets (5 mg total) daily with breakfast for 4 days. Start taking on: January 07, 2023 Started by: Coralyn Helling, MD   promethazine 25 MG tablet Commonly known as: PHENERGAN TAKE 1 TABLET BY MOUTH EVERY 8 HOURS AS NEEDED FOR NAUSEA AND VOMITING   propranolol 10 MG tablet Commonly known as: INDERAL Take 10 mg by mouth 2 (two) times daily.   Trelegy Ellipta 100-62.5-25 MCG/ACT Aepb Generic drug: Fluticasone-Umeclidin-Vilant INHALE 1 PUFF BY MOUTH EVERY DAY   vitamin B-12 500 MCG tablet Commonly known as: CYANOCOBALAMIN Take 1,000 mcg by mouth daily.   VITAMIN D PO Take 1,000 Units by mouth daily.        Signature:  Coralyn Helling, MD [facility]Critical [facility] Pager - [phone] 01/07/2023, 4:30 PM

## 2023-01-15 ENCOUNTER — Other Ambulatory Visit: Payer: Self-pay | Admitting: Pulmonary Disease

## 2023-01-16 ENCOUNTER — Ambulatory Visit: Payer: Medicare HMO | Admitting: Nurse Practitioner

## 2023-01-18 ENCOUNTER — Encounter (INDEPENDENT_AMBULATORY_CARE_PROVIDER_SITE_OTHER): Payer: Self-pay | Admitting: *Deleted

## 2023-01-21 ENCOUNTER — Ambulatory Visit (INDEPENDENT_AMBULATORY_CARE_PROVIDER_SITE_OTHER): Payer: Medicare HMO | Admitting: Gastroenterology

## 2023-02-04 ENCOUNTER — Other Ambulatory Visit: Payer: Self-pay | Admitting: Pulmonary Disease

## 2023-03-04 ENCOUNTER — Ambulatory Visit (INDEPENDENT_AMBULATORY_CARE_PROVIDER_SITE_OTHER): Payer: Medicare HMO

## 2023-03-04 ENCOUNTER — Encounter: Payer: Self-pay | Admitting: Internal Medicine

## 2023-03-04 ENCOUNTER — Ambulatory Visit (INDEPENDENT_AMBULATORY_CARE_PROVIDER_SITE_OTHER): Payer: Medicare HMO | Admitting: Internal Medicine

## 2023-03-04 VITALS — BP 146/72 | HR 93 | Temp 99.0°F | Ht 62.5 in | Wt 211.6 lb

## 2023-03-04 DIAGNOSIS — J4489 Other specified chronic obstructive pulmonary disease: Secondary | ICD-10-CM | POA: Diagnosis not present

## 2023-03-04 DIAGNOSIS — R0602 Shortness of breath: Secondary | ICD-10-CM

## 2023-03-04 DIAGNOSIS — J439 Emphysema, unspecified: Secondary | ICD-10-CM

## 2023-03-04 DIAGNOSIS — C50912 Malignant neoplasm of unspecified site of left female breast: Secondary | ICD-10-CM

## 2023-03-04 DIAGNOSIS — Z171 Estrogen receptor negative status [ER-]: Secondary | ICD-10-CM

## 2023-03-04 DIAGNOSIS — I480 Paroxysmal atrial fibrillation: Secondary | ICD-10-CM | POA: Diagnosis not present

## 2023-03-04 NOTE — Progress Notes (Signed)
Teresa Benitez    [id number]    1956/07/04  Primary Care Physician:Patel, Manfred Arch, DO Date of Appointment: 03/04/2023 Established Patient Visit  Chief complaint:   Chief Complaint  Patient presents with   Acute Visit    Increased SOB x 1 weeks     HPI: Teresa Benitez is a 67 y.o. woman with COPD and Stage Ia ductal carcinoma triple negative breast cancer (left) on active chemotherapy.   Interval Updates: Here for acute visit for shortness of breath. She has had mastectomy of the left breast. Is on chemotherapy second round. 8 more sessions to go.   Shortness of breath is going on for 1 week. Has to sit after one flight of stairs. Short of breath with ADLs. Has been taking albuterol inhaler without effect. She is on trelegy 1 puff once daily. She does have seasonal allergies and takes montelukast nightly. She has had some sinus headaches and pressure she attributes to her allergies.   No swelling in her legs.  She is 94% on room air.   She is on eliquis for atrial fibrillation - she started it about a month ago.  On metoprolol 50 SR. Hasn't been checking HR or pulsox at home.   No cough. No fevers or chills. But she is having night sweats. No chest pain.   She notes being more active this round of chemo more than her previous rounds. Denies prolonged immobility. Has been gardening.   She had labs today  at [facility] center. Her platelts are 107 and so her chemo session is on hold. Hgb 9.9 stable from recent weeks. WBC 4.0. K was 3.3 otherwise BMET unremarkable.   I have reviewed the patient's family social and past medical history and updated as appropriate.   Past Medical History:  Diagnosis Date   Carpal tunnel syndrome, bilateral 08/26/2018   Chest pain    negative myoview 02/14/2015   Chronic constipation    Cirrhosis (HCC)    COPD (chronic obstructive pulmonary disease) (HCC)    COVID-19 05/2021   DDD (degenerative disc disease), lumbar     Depression    Fibromyalgia    Hepatitis C 2004   treated, none since 2016   History of kidney stones    Hypertension    Hypothyroidism    Peripheral neuropathy 08/26/2018   Spleen enlarged    Thrombocytopenia (HCC)    Vision abnormalities     Past Surgical History:  Procedure Laterality Date   CARDIAC CATHETERIZATION     with stent   CESAREAN SECTION  1996   COLONOSCOPY  5-6 years ago    Done In [place]   ESOPHAGOGASTRODUODENOSCOPY (EGD) WITH PROPOFOL N/A 08/23/2017   Procedure: ESOPHAGOGASTRODUODENOSCOPY (EGD) WITH PROPOFOL;  Surgeon: Malissa Hippo, MD;  Location: [facility];  Service: Endoscopy;  Laterality: N/A;   Para Thyhoid  2005   PARTIAL HYSTERECTOMY     Patient states that she had 16 years ago?1998   UPPER GASTROINTESTINAL ENDOSCOPY      Family History  Problem Relation Age of Onset   Healthy Mother    Diabetes Father    Heart disease Father    Hypertension Father    Healthy Brother    Healthy Brother    Healthy Daughter    Healthy Son    Lung cancer Maternal Uncle     Social History   Occupational History   Not on file  Tobacco Use   Smoking status:  Every Day    Packs/day: 0.50    Years: 44.00    Additional pack years: 0.00    Total pack years: 22.00    Types: Cigarettes   Smokeless tobacco: Never   Tobacco comments:    Smokes 0.25 packs of cigarettes daily ARJ 03/04/23  Vaping Use   Vaping Use: Never used  Substance and Sexual Activity   Alcohol use: No   Drug use: No   Sexual activity: Not on file     Physical Exam: Blood pressure (!) 146/72, pulse 93, temperature 99 F (37.2 C), temperature source Oral, height 5' 2.5" (1.588 m), weight 211 lb 9.6 oz (96 kg), SpO2 94 %.  Gen:      No acute distress, hair loss consistent with recent chemo. Chronically ill and fatigued but not in distress.  Lungs:    No increased respiratory effort, symmetric chest wall excursion, clear to auscultation bilaterally, no wheezes or crackles CV:          irregularly irregular, HR in the 90s. No edema   Data Reviewed: Imaging: I have personally reviewed the chest xray obtained today shows chest xray obtained today shows no acute process. She has a right chest wall port in place.   PFTs:     Latest Ref Rng & Units 04/07/2020   11:00 AM  PFT Results  FVC-Pre L 2.47   FVC-Predicted Pre % 64   FVC-Post L 2.84   FVC-Predicted Post % 73   Pre FEV1/FVC % % 72   Post FEV1/FCV % % 72   FEV1-Pre L 1.78   FEV1-Predicted Pre % 60   FEV1-Post L 2.04   DLCO uncorrected ml/min/mmHg 15.93   DLCO UNC% % 67   DLCO corrected ml/min/mmHg 15.93   DLCO COR %Predicted % 67   DLVA Predicted % 79   TLC L 5.33   TLC % Predicted % 91   RV % Predicted % 109    I have personally reviewed the patient's PFTs and no airflow limitation from 2021  Labs: Lab Results  Component Value Date   NA 142 07/09/2022   K 3.9 07/09/2022   CO2 35 (H) 07/09/2022   GLUCOSE 134 07/09/2022   BUN 11 07/09/2022   CREATININE 0.59 07/09/2022   CALCIUM 9.8 07/09/2022   GFRNONAA 95 04/29/2019   Lab Results  Component Value Date   WBC 6.0 07/09/2022   HGB 13.1 07/09/2022   HCT 39.7 07/09/2022   MCV 82.9 07/09/2022   PLT 139 (L) 07/09/2022    Immunization status: Immunization History  Administered Date(s) Administered   Influenza,inj,Quad PF,6+ Mos 08/27/2017, 06/30/2018, 07/19/2021, 06/11/2022   Influenza,inj,quad, With Preservative 06/18/2019   Moderna Sars-Covid-2 Vaccination 03/03/2020, 03/31/2020   PNEUMOCOCCAL CONJUGATE-20 04/12/2021   Pneumococcal Polysaccharide-23 10/01/2018    External Records Personally Reviewed: pulmonary  Assessment:  Shortness of breath COPD, mild  Atrial fibrillation recent diagnosis on metoprolol and eliquis  Plan/Recommendations:  Chest xray obtained today - no acute process I did have her ambulated and she dropped from 94% to 92% HR went to 101. In a fib with ambulation I did review her lab work from [facility]  which was reassuring other than K 3.3, Platelets 107, Hgb stable around 10.   Differential diagnosis includes pulmonary embolism, heart failure, worsening a. Fib. I doubt copd exacerbation. She is currenetly on Toprol XL 50 mg daily. This was increased from 25 mg to get optimal rate control. Query if this could be related to BB side  effect. She is already on eliquis so if this is a PE she is on adequate treatment and doesn't have signs or symptoms related to massive PE (hypotension, chest pain, AMS, profound hypoxemia.)  For now continue the BB and eliquis. Continue inhalers. It doesn't seem like adding or changing inhalers will be useful at this time. I've asked her to monitor symptoms with HR, BP and O2 sat by procuring a puls oximeter.   I did discuss the possibility of hospital/ED evaluation with her and I think we can defer this for now.   This patient required my highest level of medical decision making. She has a follow up with Dr. Craige Cotta in a week. I've given her return to care precautions for emergency care.    Return to Care: As scheduled in one week.    Durel Salts, MD Pulmonary and Critical Care Medicine [facility] Office:[phone]

## 2023-03-11 ENCOUNTER — Ambulatory Visit (HOSPITAL_BASED_OUTPATIENT_CLINIC_OR_DEPARTMENT_OTHER): Payer: Medicare HMO | Admitting: Pulmonary Disease

## 2023-03-22 ENCOUNTER — Other Ambulatory Visit (INDEPENDENT_AMBULATORY_CARE_PROVIDER_SITE_OTHER): Payer: Self-pay | Admitting: Gastroenterology

## 2023-03-22 ENCOUNTER — Other Ambulatory Visit: Payer: Self-pay | Admitting: Pulmonary Disease

## 2023-04-01 ENCOUNTER — Ambulatory Visit (HOSPITAL_COMMUNITY): Admission: RE | Admit: 2023-04-01 | Payer: Medicare HMO | Source: Ambulatory Visit

## 2023-07-15 ENCOUNTER — Other Ambulatory Visit (INDEPENDENT_AMBULATORY_CARE_PROVIDER_SITE_OTHER): Payer: Self-pay | Admitting: Gastroenterology

## 2023-07-15 NOTE — Telephone Encounter (Signed)
Needs office visit last seen 06/2022.

## 2023-07-29 ENCOUNTER — Ambulatory Visit (INDEPENDENT_AMBULATORY_CARE_PROVIDER_SITE_OTHER): Payer: Medicare HMO | Admitting: Gastroenterology

## 2023-08-08 ENCOUNTER — Encounter (INDEPENDENT_AMBULATORY_CARE_PROVIDER_SITE_OTHER): Payer: Self-pay | Admitting: Gastroenterology

## 2023-08-08 ENCOUNTER — Telehealth (INDEPENDENT_AMBULATORY_CARE_PROVIDER_SITE_OTHER): Payer: Self-pay | Admitting: Gastroenterology

## 2023-08-08 ENCOUNTER — Ambulatory Visit (INDEPENDENT_AMBULATORY_CARE_PROVIDER_SITE_OTHER): Payer: Medicare HMO | Admitting: Gastroenterology

## 2023-08-08 VITALS — BP 179/74 | HR 65 | Temp 99.0°F | Ht 62.5 in | Wt 213.9 lb

## 2023-08-08 DIAGNOSIS — Z860100 Personal history of colon polyps, unspecified: Secondary | ICD-10-CM

## 2023-08-08 DIAGNOSIS — K439 Ventral hernia without obstruction or gangrene: Secondary | ICD-10-CM | POA: Insufficient documentation

## 2023-08-08 DIAGNOSIS — K7460 Unspecified cirrhosis of liver: Secondary | ICD-10-CM | POA: Diagnosis not present

## 2023-08-08 DIAGNOSIS — T402X5A Adverse effect of other opioids, initial encounter: Secondary | ICD-10-CM

## 2023-08-08 DIAGNOSIS — B1920 Unspecified viral hepatitis C without hepatic coma: Secondary | ICD-10-CM

## 2023-08-08 DIAGNOSIS — K581 Irritable bowel syndrome with constipation: Secondary | ICD-10-CM

## 2023-08-08 DIAGNOSIS — K5903 Drug induced constipation: Secondary | ICD-10-CM

## 2023-08-08 DIAGNOSIS — R1033 Periumbilical pain: Secondary | ICD-10-CM

## 2023-08-08 NOTE — Progress Notes (Signed)
Teresa Benitez, M.D. Gastroenterology & Hepatology [facility] Gastroenterology [address] [place], [place] [address]  Primary Care Physician: Catalina Lunger, DO [address] [place] [address]  I will communicate my assessment and recommendations to the referring MD via EMR.  Problems: Liver cirrhosis due to hepatitis C Hepatitis C, achieved SVR after treatment with DAA (unknown regimen) Opioid-induced constipation   History of Present Illness: Teresa Benitez is a 67 y.o. female with PMH diabetes, liver cirrhosis due to hepatitis C status post treatment with DAA and achieved SVR, fibromyalgia, hypothyroidism, afib on Eliquis, chronic constipation, depression and history of breast cancer status post partial mastectomy with sentinel lymph node excision, chemotherapy radiation, who presents for follow up of cirrhosis.  The patient was last seen on 07/09/2022.  At that time the patient was advised to schedule EGD and colonoscopy but she did not have a ride available and stated that she will give [place] a call back to schedule this.  She was continue Movantik 12.5 mg every day for opioid-induced constipation and MiraLAX daily.  An MRI of the liver with and without IV contrast was performed to evaluate previously seen lesion on a CT scan of the abdomen and pelvis with IV contrast.  This was performed on 08/06/2022 which showed changes of liver cirrhosis and portal hypertension given umbilical vein recanalization but no lesions in the liver.  Patient has recetnly noticed there is a new bulging lesion in her L paraumbilical area. She reports that the bulging hurts some and  has to use a heating pad. She also reports having ongoing pain in the left side of the abdomen  - she has had this for the last 8 years.  She is having a BM almost every day. She is currently taking Movantik daily but she may take Miralax if she has occasional episodes of constipation and has  associated bloating.   The patient denies having any nausea, vomiting, fever, chills, hematochezia, melena, hematemesis, diarrhea, jaundice, pruritus or significant weight changes.  Notably, the patient did not follow-up for her appointment in April 2024. Patient  has been busy with ongoing medical issues with breast cancer that required partial mastectomy with sentinel lymph node excision, chemotherapy and radiation. She states that she is done with CRTx.  Cirrhosis related questions: Hematemesis/coffee ground emesis: No History of variceal bleeding: No Abdominal pain: No Abdominal distention/worsening ascites: No, unless she gets constipated Fever/chills: No Episodes of confusion/disorientation: No Number of daily bowel movements:every day once Taking diuretics?: No Prior history of banding?: No Prior episodes of SBP: No Last time liver imaging was performed:MRI as described above Last AFP 07/09/22 - 3.1 MELD score: 07/09/2022 - 7 Hepatitis A/B vaccination: B on 10/23/2022.   Last EGD: 07/2017 - Normal esophagus. - Z-line regular, 42 cm from the incisors. - Portal hypertensive gastropathy. - Gastric antral vascular ectasia without bleeding. - Non-bleeding erosive gastropathy. - Normal duodenal bulb and second portion of the duodenum. - No specimens collected.   Last Colonoscopy: 09/2015, removal of polyp, recommended to have repeat surveillance colonoscopy in jan 2022  Past Medical History: Past Medical History:  Diagnosis Date   Atrial fib/flutter, transient (HCC)    Carpal tunnel syndrome, bilateral 08/26/2018   Chest pain    negative myoview 02/14/2015   Chronic constipation    Cirrhosis (HCC)    COPD (chronic obstructive pulmonary disease) (HCC)    COVID-19 05/2021   DDD (degenerative disc disease), lumbar    Depression  Fibromyalgia    Hepatitis C 2004   treated, none since 2016   History of kidney stones    Hypertension    Hypothyroidism    Peripheral  neuropathy 08/26/2018   Spleen enlarged    Thrombocytopenia (HCC)    Vision abnormalities     Past Surgical History: Past Surgical History:  Procedure Laterality Date   CARDIAC CATHETERIZATION     with stent   CESAREAN SECTION  1996   COLONOSCOPY  5-6 years ago    Done In [place]   ESOPHAGOGASTRODUODENOSCOPY (EGD) WITH PROPOFOL N/A 08/23/2017   Procedure: ESOPHAGOGASTRODUODENOSCOPY (EGD) WITH PROPOFOL;  Surgeon: Malissa Hippo, MD;  Location: [facility];  Service: Endoscopy;  Laterality: N/A;   Para Thyhoid  2005   PARTIAL HYSTERECTOMY     Patient states that she had 16 years ago?1998   UPPER GASTROINTESTINAL ENDOSCOPY      Family History: Family History  Problem Relation Age of Onset   Healthy Mother    Diabetes Father    Heart disease Father    Hypertension Father    Healthy Brother    Healthy Brother    Healthy Daughter    Healthy Son    Lung cancer Maternal Uncle     Social History: Social History   Tobacco Use  Smoking Status Every Day   Current packs/day: 0.50   Average packs/day: 0.5 packs/day for 44.0 years (22.0 ttl pk-yrs)   Types: Cigarettes  Smokeless Tobacco Never  Tobacco Comments   Smokes 0.25 packs of cigarettes daily [facility] 03/04/23   Social History   Substance and Sexual Activity  Alcohol Use No   Social History   Substance and Sexual Activity  Drug Use No    Allergies: Allergies  Allergen Reactions   Ampicillin Hives, Shortness Of Breath and Swelling    Lips swelling Has patient had a PCN reaction causing immediate rash, facial/tongue/throat swelling, SOB or lightheadedness with hypotension: Yes Has patient had a PCN reaction causing severe rash involving mucus membranes or skin necrosis: No Has patient had a PCN reaction that required hospitalization: No Has patient had a PCN reaction occurring within the last 10 years: Yes If all of the above answers are "NO", then may proceed with Cephalosporin use.    Carbamazepine      Nausea, headache   Ciprofloxacin Hives   Dilantin [Phenytoin Sodium Extended]     nausea   Sulfamethoxazole Hives    Medications: Current Outpatient Medications  Medication Sig Dispense Refill   albuterol (PROVENTIL) (2.5 MG/3ML) 0.083% nebulizer solution Take 3 mLs (2.5 mg total) by nebulization every 6 (six) hours as needed for wheezing or shortness of breath. 360 mL 5   albuterol (VENTOLIN HFA) 108 (90 Base) MCG/ACT inhaler Inhale 2 puffs into the lungs every 6 (six) hours as needed for wheezing or shortness of breath. 8 g 5   amLODipine (NORVASC) 5 MG tablet Take 5 mg by mouth daily.     anastrozole (ARIMIDEX) 1 MG tablet Take 1 tablet by mouth daily.     atorvastatin (LIPITOR) 10 MG tablet Take 1 tablet by mouth daily.     BELBUCA 150 MCG FILM 450 mcg 2 (two) times daily.   0   Cholecalciferol (VITAMIN D PO) Take 1,000 Units by mouth daily.     dicyclomine (BENTYL) 10 MG capsule TAKE 1 CAPSULE (10 MG TOTAL) BY MOUTH 3 (THREE) TIMES DAILY AS NEEDED FOR SPASMS. 270 capsule 1   ferrous sulfate 325 (65 FE) MG  tablet Take 325 mg by mouth daily with breakfast.     Fluticasone-Umeclidin-Vilant (TRELEGY ELLIPTA) 100-62.5-25 MCG/ACT AEPB INHALE 1 PUFF BY MOUTH EVERY DAY 60 each 4   gabapentin (NEURONTIN) 600 MG tablet Take by mouth 3 (three) times daily. 2 tabs bid     levothyroxine (SYNTHROID) 137 MCG tablet Take 137 mcg by mouth daily before breakfast.     lisinopril (ZESTRIL) 40 MG tablet Take 40 mg by mouth daily.     metFORMIN (GLUCOPHAGE-XR) 500 MG 24 hr tablet Take 500 mg by mouth daily with breakfast.     methocarbamol (ROBAXIN) 500 MG tablet Take 500-750 mg by mouth 2 (two) times daily as needed for muscle spasms (takes 1 tablet in the morning and 1.5 tablet at bedtime). Takes a dose every morning and another dose at bedtime only if needed for pain     mirtazapine (REMERON) 15 MG tablet TAKE 1 TABLET BY MOUTH EVERY DAY AT NIGHT  2   montelukast (SINGULAIR) 10 MG tablet TAKE 1 TABLET BY  MOUTH EVERYDAY AT BEDTIME 90 tablet 3   MOVANTIK 12.5 MG TABS tablet TAKE 1 TABLET BY MOUTH EVERY DAY 90 tablet 3   Multiple Vitamin (MULTI-VITAMINS) TABS Take 1 tablet by mouth daily.     naproxen sodium (ANAPROX) 220 MG tablet Take 440 mg by mouth daily as needed (pain).     ondansetron (ZOFRAN) 4 MG tablet Take 1 tablet (4 mg total) by mouth every 8 (eight) hours as needed for nausea or vomiting. Patient takes in the morning. 30 tablet 1   oxyCODONE (OXY IR/ROXICODONE) 5 MG immediate release tablet Take 10 mg by mouth every 8 (eight) hours as needed for moderate pain or severe pain (depends on pain level if takes 1-3 tablets).      pantoprazole (PROTONIX) 40 MG tablet TAKE 1 TABLET BY MOUTH EVERY DAY BEFORE BREAKFAST 90 tablet 1   promethazine (PHENERGAN) 25 MG tablet TAKE 1 TABLET BY MOUTH EVERY 8 HOURS AS NEEDED FOR NAUSEA AND VOMITING 30 tablet 0   propranolol (INDERAL) 10 MG tablet Take 10 mg by mouth 2 (two) times daily.      ELIQUIS 5 MG TABS tablet Take 5 mg by mouth 2 (two) times daily.     vitamin B-12 (CYANOCOBALAMIN) 500 MCG tablet Take 1,000 mcg by mouth daily.  (Patient not taking: Reported on 08/08/2023)     No current facility-administered medications for this visit.    Review of Systems: GENERAL: negative for malaise, night sweats HEENT: No changes in hearing or vision, no nose bleeds or other nasal problems. NECK: Negative for lumps, goiter, pain and significant neck swelling RESPIRATORY: Negative for cough, wheezing CARDIOVASCULAR: Negative for chest pain, leg swelling, palpitations, orthopnea GI: SEE HPI MUSCULOSKELETAL: Negative for joint pain or swelling, back pain, and muscle pain. SKIN: Negative for lesions, rash PSYCH: Negative for sleep disturbance, mood disorder and recent psychosocial stressors. HEMATOLOGY Negative for prolonged bleeding, bruising easily, and swollen nodes. ENDOCRINE: Negative for cold or heat intolerance, polyuria, polydipsia and  goiter. NEURO: negative for tremor, gait imbalance, syncope and seizures. The remainder of the review of systems is noncontributory.   Physical Exam: BP (!) 179/74   Pulse 65   Temp 99 F (37.2 C) (Oral)   Ht 5' 2.5" (1.588 m)   Wt 213 lb 14.4 oz (97 kg)   BMI 38.50 kg/m  GENERAL: The patient is AO x3, in no acute distress. Obese.  HEENT: Head is normocephalic and atraumatic. EOMI are  intact. Mouth is well hydrated and without lesions. NECK: Supple. No masses LUNGS: Clear to auscultation. No presence of rhonchi/wheezing/rales. Adequate chest expansion HEART: RRR, normal s1 and s2. ABDOMEN: Soft, nontender, no guarding, no peritoneal signs, and nondistended. BS +. Slight bulging lesion in the left paraumbilical area when standing, which is reducible, possible non incarcerated ventral hernia. EXTREMITIES: Without any cyanosis, clubbing, rash, lesions or edema. NEUROLOGIC: AOx3, no focal motor deficit. SKIN: no jaundice, no rashes  Imaging/Labs: as above  I personally reviewed and interpreted the available labs, imaging and endoscopic files.  Impression and Plan: Teresa Benitez is a 67 y.o. female with PMH diabetes, liver cirrhosis due to hepatitis C status post treatment with DAA and achieved SVR, fibromyalgia, hypothyroidism, afib on Eliquis, chronic constipation, depression and history of breast cancer status post partial mastectomy with sentinel lymph node excision, chemotherapy radiation, who presents for follow up of cirrhosis.  Patient has presented compensated liver cirrhosis but with some signs of portal hypertension on previous imaging.  She has had a low MELD score in the past.  Has not presented any decompensating events.  At this point we will obtain surveillance labs and perform AFP with cross-sectional abdominal imaging for [facility] screening.  She is presenting abdominal changes concerning for a ventral hernia which does not have any signs of incarceration or strangulation.   We may be able to evaluate this area better with an MRI, which will also help for [facility] screening purposes.  She is not immune to hepatitis A or B, she will ask her PCP to obtain these vaccinations.  Also, she is due for esophageal variceal screening with an EGD.  Unfortunately she did not schedule this after her last appointment and was lost to follow-up.  I encouraged her to proceed with this as she is on anticoagulation which may increase her risk of variceal bleeding.  She understood and will proceed with this.  Will also proceed with a colonoscopy in the same day as she is due for colorectal cancer screening given her history of colonic polyps.  The patient has presented adequate bowel frequency with combination of Movantik and as needed MiraLAX which she will continue for now.  - Check CBC, MELD labs and AFP - Schedule EGD and colonoscopy - Schedule abdominal MR with and without IV contrast - Continue Movantik 12.5 mg qday - Can take Miralax as needed for constipation - Reduce salt intake to <2 g per day - Can take Tylenol max of 2 g per day (650 mg q8h) for pain - Avoid NSAIDs for pain - Avoid eating raw oysters/shellfish - Protein shake (Ensure or Boost) every night before going to sleep - Ask PCP for hepatitis  A and B vaccination  All questions were answered.      Teresa Blazing, MD Gastroenterology and Hepatology [facility] Gastroenterology

## 2023-08-08 NOTE — Patient Instructions (Addendum)
-   Check CBC, MELD labs and AFP - Schedule EGD and colonoscopy - Schedule abdominal MR with and without IV contrast - Continue Movantik 12.5 mg qday - Can take Miralax as needed for constipation - Reduce salt intake to <2 g per day - Can take Tylenol max of 2 g per day (650 mg q8h) for pain - Avoid NSAIDs for pain - Avoid eating raw oysters/shellfish - Protein shake (Ensure or Boost) every night before going to sleep - Ask PCP for hepatitis  A and B vaccination

## 2023-08-08 NOTE — Telephone Encounter (Signed)
    08/08/23  Teresa Benitez Jan 01, 1956  What type of surgery is being performed? EGD/Colonoscopy   When is surgery scheduled? TBD  Clearance to hold Eliquis 2 days prior   Name of physician performing surgery?  Dr. [name] [facility] at [facility] Phone: [phone] Fax: [phone]  Anethesia type (none, local, MAC, general)? MAC

## 2023-08-08 NOTE — Telephone Encounter (Signed)
Left message to return call to schedule EGD/TCS and give MRI appt.  MRI scheduled for 08/12/23 at 6pm. Pt to arrive at 5:45pm [facility]. NPO 4 hours prior.

## 2023-08-09 LAB — CBC WITH DIFFERENTIAL/PLATELET
Absolute Lymphocytes: 945 {cells}/uL (ref 850–3900)
Absolute Monocytes: 360 {cells}/uL (ref 200–950)
Basophils Absolute: 40 {cells}/uL (ref 0–200)
Basophils Relative: 0.8 %
Eosinophils Absolute: 130 {cells}/uL (ref 15–500)
Eosinophils Relative: 2.6 %
HCT: 39.3 % (ref 35.0–45.0)
Hemoglobin: 13.2 g/dL (ref 11.7–15.5)
MCH: 30.8 pg (ref 27.0–33.0)
MCHC: 33.6 g/dL (ref 32.0–36.0)
MCV: 91.8 fL (ref 80.0–100.0)
MPV: 11 fL (ref 7.5–12.5)
Monocytes Relative: 7.2 %
Neutro Abs: 3525 {cells}/uL (ref 1500–7800)
Neutrophils Relative %: 70.5 %
Platelets: 98 10*3/uL — ABNORMAL LOW (ref 140–400)
RBC: 4.28 10*6/uL (ref 3.80–5.10)
RDW: 15.7 % — ABNORMAL HIGH (ref 11.0–15.0)
Total Lymphocyte: 18.9 %
WBC: 5 10*3/uL (ref 3.8–10.8)

## 2023-08-09 LAB — COMPREHENSIVE METABOLIC PANEL
AG Ratio: 1.2 (calc) (ref 1.0–2.5)
ALT: 12 U/L (ref 6–29)
AST: 27 U/L (ref 10–35)
Albumin: 3.9 g/dL (ref 3.6–5.1)
Alkaline phosphatase (APISO): 105 U/L (ref 37–153)
BUN: 12 mg/dL (ref 7–25)
CO2: 34 mmol/L — ABNORMAL HIGH (ref 20–32)
Calcium: 9.5 mg/dL (ref 8.6–10.4)
Chloride: 101 mmol/L (ref 98–110)
Creat: 0.61 mg/dL (ref 0.50–1.05)
Globulin: 3.3 g/dL (ref 1.9–3.7)
Glucose, Bld: 102 mg/dL — ABNORMAL HIGH (ref 65–99)
Potassium: 3.6 mmol/L (ref 3.5–5.3)
Sodium: 141 mmol/L (ref 135–146)
Total Bilirubin: 0.8 mg/dL (ref 0.2–1.2)
Total Protein: 7.2 g/dL (ref 6.1–8.1)

## 2023-08-09 LAB — PROTIME-INR
INR: 1.1
Prothrombin Time: 11.4 s (ref 9.0–11.5)

## 2023-08-09 LAB — AFP TUMOR MARKER: AFP-Tumor Marker: 2.9 ng/mL

## 2023-08-12 ENCOUNTER — Ambulatory Visit (HOSPITAL_COMMUNITY): Payer: Medicare HMO | Attending: Gastroenterology

## 2023-08-19 ENCOUNTER — Other Ambulatory Visit (INDEPENDENT_AMBULATORY_CARE_PROVIDER_SITE_OTHER): Payer: Self-pay | Admitting: Gastroenterology

## 2023-08-19 NOTE — Telephone Encounter (Signed)
Pt left voicemail in regards to scheduling MRI; TCS/EGD. MRI was scheduled for 08/12/23 but pt no showed. Rescheduled to 08/21/23 at 6pm. Pt is to be at [facility] at 5:45pm. NPO 4 hours prior.  Called pt to inform her of MRI appointment. Pt will not have ride until end of December. Pt did not want 09/19/23. Advised pt I could call if I had a cancellation, otherwise I would need to call with January schedule. Pt verbalized understanding

## 2023-08-21 ENCOUNTER — Ambulatory Visit (HOSPITAL_COMMUNITY): Payer: Medicare HMO

## 2023-09-04 MED ORDER — PEG 3350-KCL-NA BICARB-NACL 420 G PO SOLR: 4000.0000 mL | Freq: Once | ORAL | 0 refills | Status: AC

## 2023-09-04 NOTE — Addendum Note (Signed)
Addended by: Marlowe Shores on: 09/04/2023 03:06 PM   Modules accepted: Orders

## 2023-09-04 NOTE — Telephone Encounter (Signed)
Pt contacted and EGD/TCS scheduled for 10/08/23. Instructions will be mailed once pre op has been received. Prep sent to pharmacy.

## 2023-09-09 ENCOUNTER — Ambulatory Visit (HOSPITAL_COMMUNITY): Admission: RE | Admit: 2023-09-09 | Payer: Medicare HMO | Source: Ambulatory Visit

## 2023-09-19 ENCOUNTER — Ambulatory Visit (HOSPITAL_COMMUNITY): Payer: Medicare HMO

## 2023-09-23 ENCOUNTER — Other Ambulatory Visit (INDEPENDENT_AMBULATORY_CARE_PROVIDER_SITE_OTHER): Payer: Self-pay | Admitting: Gastroenterology

## 2023-09-30 ENCOUNTER — Telehealth: Payer: Self-pay | Admitting: Internal Medicine

## 2023-09-30 NOTE — Patient Instructions (Signed)
 Teresa Benitez  09/30/2023     @PREFPERIOPPHARMACY @   Your procedure is scheduled on  10/08/2023.    Report to [name] at  0830  A.M.    Call this number if you have problems the morning of surgery:  [phone]  If you experience any cold or flu symptoms such as cough, fever, chills, shortness of breath, etc. between now and your scheduled surgery, please notify us  at the above number.   Remember:        Your last dose of iron should be on 09/30/2023 and your last dose of eliquis  should be on 10/05/2023.     DO NOT take any medications for diabetes the morning of your procedure.     Use your nebulizer and your inhaler before you come and bring your rescue inhaler with you.    Follow the diet and prep instructions given to you by the office.    You may drink clear liquids until 0630 am on 10/08/2023.    Clear liquids allowed are:                    Water, Juice (No red color; non-citric and without pulp; diabetics please choose diet or no sugar options), Carbonated beverages (diabetics please choose diet or no sugar options), Clear Tea (No creamer, milk, or cream, including half & half and powdered creamer), Black Coffee Only (No creamer, milk or cream, including half & half and powdered creamer), and Clear Sports drink (No red color; diabetics please choose diet or no sugar options)    Take these medicines the morning of surgery with A SIP OF WATER           amlodipine , anastozole, gabapentin , levothyroxine , methocarbamal, oxycodone (if needed), pantoprazole .     Do not wear jewelry, make-up or nail polish, including gel polish,  artificial nails, or any other type of covering on natural nails (fingers and  toes).  Do not wear lotions, powders, or perfumes, or deodorant.  Do not shave 48 hours prior to surgery.  Men may shave face and neck.  Do not bring valuables to the hospital.  [facility] is not responsible for any belongings or valuables.  Contacts,  dentures or bridgework may not be worn into surgery.  Leave your suitcase in the car.  After surgery it may be brought to your room.  For patients admitted to the hospital, discharge time will be determined by your treatment team.  Patients discharged the day of surgery will not be allowed to drive home and must have someone with them for 24 hours.    Special instructions:   DO NOT smoke tobacco or vape for 24 hours before your procedure.  Please read over the following fact sheets that you were given. Anesthesia Post-op Instructions and Care and Recovery After Surgery      Upper Endoscopy, Adult, Care After After the procedure, it is common to have a sore throat. It is also common to have: Mild stomach pain or discomfort. Bloating. Nausea. Follow these instructions at home: The instructions below may help you care for yourself at home. Your health care provider may give you more instructions. If you have questions, ask your health care provider. If you were given a sedative during the procedure, it can affect you for several hours. Do not drive or operate machinery until your health care provider says that it is safe. If you will be going home right after the  procedure, plan to have a responsible adult: Take you home from the hospital or clinic. You will not be allowed to drive. Care for you for the time you are told. Follow instructions from your health care provider about what you may eat and drink. Return to your normal activities as told by your health care provider. Ask your health care provider what activities are safe for you. Take over-the-counter and prescription medicines only as told by your health care provider. Contact a health care provider if you: Have a sore throat that lasts longer than one day. Have trouble swallowing. Have a fever. Get help right away if you: Vomit blood or your vomit looks like coffee grounds. Have bloody, black, or tarry stools. Have a very  bad sore throat or you cannot swallow. Have difficulty breathing or very bad pain in your chest or abdomen. These symptoms may be an emergency. Get help right away. Call 911. Do not wait to see if the symptoms will go away. Do not drive yourself to the hospital. Summary After the procedure, it is common to have a sore throat, mild stomach discomfort, bloating, and nausea. If you were given a sedative during the procedure, it can affect you for several hours. Do not drive until your health care provider says that it is safe. Follow instructions from your health care provider about what you may eat and drink. Return to your normal activities as told by your health care provider. This information is not intended to replace advice given to you by your health care provider. Make sure you discuss any questions you have with your health care provider. Document Revised: 12/20/2021 Document Reviewed: 12/20/2021 Elsevier Patient Education  2024 [employer] Colonoscopy, Adult, Care After The following information offers guidance on how to care for yourself after your procedure. Your health care provider may also give you more specific instructions. If you have problems or questions, contact your health care provider. What can I expect after the procedure? After the procedure, it is common to have: A small amount of blood in your stool for 24 hours after the procedure. Some gas. Mild cramping or bloating of your abdomen. Follow these instructions at home: Eating and drinking  Drink enough fluid to keep your urine pale yellow. Follow instructions from your health care provider about eating or drinking restrictions. Resume your normal diet as told by your health care provider. Avoid heavy or fried foods that are hard to digest. Activity Rest as told by your health care provider. Avoid sitting for a long time without moving. Get up to take short walks every 1-2 hours. This is important to improve  blood flow and breathing. Ask for help if you feel weak or unsteady. Return to your normal activities as told by your health care provider. Ask your health care provider what activities are safe for you. Managing cramping and bloating  Try walking around when you have cramps or feel bloated. If directed, apply heat to your abdomen as told by your health care provider. Use the heat source that your health care provider recommends, such as a moist heat pack or a heating pad. Place a towel between your skin and the heat source. Leave the heat on for 20-30 minutes. Remove the heat if your skin turns bright red. This is especially important if you are unable to feel pain, heat, or cold. You have a greater risk of getting burned. General instructions If you were given a sedative during the procedure, it can  affect you for several hours. Do not drive or operate machinery until your health care provider says that it is safe. For the first 24 hours after the procedure: Do not sign important documents. Do not drink alcohol. Do your regular daily activities at a slower pace than normal. Eat soft foods that are easy to digest. Take over-the-counter and prescription medicines only as told by your health care provider. Keep all follow-up visits. This is important. Contact a health care provider if: You have blood in your stool 2-3 days after the procedure. Get help right away if: You have more than a small spotting of blood in your stool. You have large blood clots in your stool. You have swelling of your abdomen. You have nausea or vomiting. You have a fever. You have increasing pain in your abdomen that is not relieved with medicine. These symptoms may be an emergency. Get help right away. Call 911. Do not wait to see if the symptoms will go away. Do not drive yourself to the hospital. Summary After the procedure, it is common to have a small amount of blood in your stool. You may also have mild  cramping and bloating of your abdomen. If you were given a sedative during the procedure, it can affect you for several hours. Do not drive or operate machinery until your health care provider says that it is safe. Get help right away if you have a lot of blood in your stool, nausea or vomiting, a fever, or increased pain in your abdomen. This information is not intended to replace advice given to you by your health care provider. Make sure you discuss any questions you have with your health care provider. Document Revised: 10/23/2022 Document Reviewed: 05/03/2021 Elsevier Patient Education  2024 [employer] Monitored Anesthesia Care, Care After The following information offers guidance on how to care for yourself after your procedure. Your health care provider may also give you more specific instructions. If you have problems or questions, contact your health care provider. What can I expect after the procedure? After the procedure, it is common to have: Tiredness. Little or no memory about what happened during or after the procedure. Impaired judgment when it comes to making decisions. Nausea or vomiting. Some trouble with balance. Follow these instructions at home: For the time period you were told by your health care provider:  Rest. Do not participate in activities where you could fall or become injured. Do not drive or use machinery. Do not drink alcohol. Do not take sleeping pills or medicines that cause drowsiness. Do not make important decisions or sign legal documents. Do not take care of children on your own. Medicines Take over-the-counter and prescription medicines only as told by your health care provider. If you were prescribed antibiotics, take them as told by your health care provider. Do not stop using the antibiotic even if you start to feel better. Eating and drinking Follow instructions from your health care provider about what you may eat and drink. Drink enough  fluid to keep your urine pale yellow. If you vomit: Drink clear fluids slowly and in small amounts as you are able. Clear fluids include water, ice chips, low-calorie sports drinks, and fruit juice that has water added to it (diluted fruit juice). Eat light and bland foods in small amounts as you are able. These foods include bananas, applesauce, rice, lean meats, toast, and crackers. General instructions  Have a responsible adult stay with you for the time you are  told. It is important to have someone help care for you until you are awake and alert. If you have sleep apnea, surgery and some medicines can increase your risk for breathing problems. Follow instructions from your health care provider about wearing your sleep device: When you are sleeping. This includes during daytime naps. While taking prescription pain medicines, sleeping medicines, or medicines that make you drowsy. Do not use any products that contain nicotine  or tobacco. These products include cigarettes, chewing tobacco, and vaping devices, such as e-cigarettes. If you need help quitting, ask your health care provider. Contact a health care provider if: You feel nauseous or vomit every time you eat or drink. You feel light-headed. You are still sleepy or having trouble with balance after 24 hours. You get a rash. You have a fever. You have redness or swelling around the IV site. Get help right away if: You have trouble breathing. You have new confusion after you get home. These symptoms may be an emergency. Get help right away. Call 911. Do not wait to see if the symptoms will go away. Do not drive yourself to the hospital. This information is not intended to replace advice given to you by your health care provider. Make sure you discuss any questions you have with your health care provider. Document Revised: 02/05/2022 Document Reviewed: 02/05/2022 Elsevier Patient Education  2024 [employer].

## 2023-09-30 NOTE — Telephone Encounter (Signed)
 Patient states needs refill for Trelegy inhaler. Pharmacy is [facility] [place]. Patient phone number is [phone].

## 2023-10-01 ENCOUNTER — Telehealth (INDEPENDENT_AMBULATORY_CARE_PROVIDER_SITE_OTHER): Payer: Self-pay | Admitting: Gastroenterology

## 2023-10-01 NOTE — Telephone Encounter (Signed)
 Pt left voicemail stating that she is going to need to cancel her EGD for next week and her pre op for tomorrow. Pt states she will call back next week to reschedule  (TCS/EGD ASA 3 Castaneda)

## 2023-10-02 ENCOUNTER — Encounter (HOSPITAL_COMMUNITY): Payer: Self-pay

## 2023-10-02 ENCOUNTER — Encounter (HOSPITAL_COMMUNITY)
Admission: RE | Admit: 2023-10-02 | Discharge: 2023-10-02 | Disposition: A | Discharge status: Home / Self Care | Payer: Medicare HMO | Source: Ambulatory Visit | Attending: Gastroenterology | Admitting: Gastroenterology

## 2023-10-02 DIAGNOSIS — I10 Essential (primary) hypertension: Secondary | ICD-10-CM

## 2023-10-02 MED ORDER — TRELEGY ELLIPTA 100-62.5-25 MCG/ACT IN AEPB: 1.0000 | INHALATION_SPRAY | Freq: Every day | RESPIRATORY_TRACT | 1 refills | Status: DC

## 2023-10-02 NOTE — Telephone Encounter (Signed)
 I refilled the trelegy x 1  She needs appt  Called the pt and left her a detailed msg letting her know that this was done and that she needs to call for appt  Closing encounter

## 2023-10-04 ENCOUNTER — Ambulatory Visit (HOSPITAL_COMMUNITY)
Admission: RE | Admit: 2023-10-04 | Discharge: 2023-10-04 | Disposition: A | Discharge status: Home / Self Care | Payer: Medicare HMO | Source: Ambulatory Visit | Attending: Gastroenterology | Admitting: Gastroenterology

## 2023-10-04 DIAGNOSIS — K439 Ventral hernia without obstruction or gangrene: Secondary | ICD-10-CM | POA: Diagnosis present

## 2023-10-04 DIAGNOSIS — K7460 Unspecified cirrhosis of liver: Secondary | ICD-10-CM | POA: Insufficient documentation

## 2023-10-04 DIAGNOSIS — R1033 Periumbilical pain: Secondary | ICD-10-CM | POA: Diagnosis present

## 2023-10-04 MED ORDER — GADOBUTROL 1 MMOL/ML IV SOLN: 9.7000 mL | Freq: Once | INTRAVENOUS | Status: DC | PRN

## 2023-10-04 MED ORDER — GADOBUTROL 1 MMOL/ML IV SOLN
9.7000 mL | Freq: Once | INTRAVENOUS | Status: AC | PRN
  Administered 2023-10-04: 9.7 mL via INTRAVENOUS

## 2023-10-08 ENCOUNTER — Encounter (HOSPITAL_COMMUNITY): Payer: Self-pay

## 2023-10-08 ENCOUNTER — Ambulatory Visit (HOSPITAL_COMMUNITY): Admit: 2023-10-08 | Payer: Medicare HMO | Admitting: Gastroenterology

## 2023-10-08 SURGERY — COLONOSCOPY WITH PROPOFOL
Anesthesia: Monitor Anesthesia Care

## 2023-10-09 ENCOUNTER — Telehealth (INDEPENDENT_AMBULATORY_CARE_PROVIDER_SITE_OTHER): Payer: Self-pay

## 2023-10-09 NOTE — Telephone Encounter (Signed)
 Diane from [facility] called report of MRI read this am. Please see results under radiology. Thanks

## 2023-10-09 NOTE — Telephone Encounter (Signed)
 Thanks, yeah I read it earlier today, will call the patient later today.

## 2023-10-09 NOTE — Telephone Encounter (Signed)
 Noted, Thanks

## 2023-10-09 NOTE — Telephone Encounter (Signed)
 Hello, the patient has returned your call here at the office. She would like to be called at [phone]

## 2023-10-10 ENCOUNTER — Encounter (INDEPENDENT_AMBULATORY_CARE_PROVIDER_SITE_OTHER): Payer: Self-pay | Admitting: *Deleted

## 2023-10-10 NOTE — Telephone Encounter (Signed)
I called the patient again today and could not get in touch with her.  Left a detailed voice message again.  I also gave her ED precautions and told her that we have already referred her to [facility] for endoscopic retrograde cholangiopancreatography and evaluation for cholecystectomy.

## 2023-11-13 ENCOUNTER — Ambulatory Visit: Payer: Medicare HMO | Admitting: Nurse Practitioner

## 2023-12-05 ENCOUNTER — Other Ambulatory Visit: Payer: Self-pay | Admitting: Internal Medicine

## 2023-12-13 ENCOUNTER — Encounter (INDEPENDENT_AMBULATORY_CARE_PROVIDER_SITE_OTHER): Payer: Self-pay | Admitting: Gastroenterology

## 2024-01-17 ENCOUNTER — Encounter: Payer: Self-pay | Admitting: Primary Care

## 2024-01-17 ENCOUNTER — Ambulatory Visit: Admitting: Primary Care

## 2024-01-17 VITALS — BP 140/64 | HR 78 | Temp 98.5°F | Ht 70.5 in | Wt 214.0 lb

## 2024-01-17 DIAGNOSIS — R911 Solitary pulmonary nodule: Secondary | ICD-10-CM | POA: Diagnosis not present

## 2024-01-17 DIAGNOSIS — I4891 Unspecified atrial fibrillation: Secondary | ICD-10-CM

## 2024-01-17 DIAGNOSIS — Z720 Tobacco use: Secondary | ICD-10-CM

## 2024-01-17 DIAGNOSIS — I10 Essential (primary) hypertension: Secondary | ICD-10-CM

## 2024-01-17 DIAGNOSIS — J4489 Other specified chronic obstructive pulmonary disease: Secondary | ICD-10-CM | POA: Diagnosis not present

## 2024-01-17 DIAGNOSIS — R011 Cardiac murmur, unspecified: Secondary | ICD-10-CM

## 2024-01-17 MED ORDER — TRELEGY ELLIPTA 200-62.5-25 MCG/ACT IN AEPB: 1.0000 | INHALATION_SPRAY | Freq: Every day | RESPIRATORY_TRACT | Status: AC

## 2024-01-17 NOTE — Patient Instructions (Addendum)
 -  CHRONIC OBSTRUCTIVE PULMONARY DISEASE WITH ASTHMA: COPD with an asthma component means that you have a chronic lung condition that causes breathing difficulties, and it is partially reversible with medication. We will provide you with a higher strength Trelegy inhaler sample to see if it helps improve your symptoms.  -LUNG NODULE: A lung nodule is a small growth in the lung that needs to be monitored for changes. Since you have a history of smoking, we recommend that you participate in a lung cancer screening program and get an annual low-dose CT scan.  -ATRIAL FIBRILLATION: AFib is an irregular heart rhythm that can cause various symptoms, including shortness of breath. We will order an echocardiogram to evaluate your heart murmur and have a nurse perform a walking test to check your heart rate response. You should also follow up with your cardiologist to discuss any potential medication adjustments.  -HYPERTENSION: Hypertension is high blood pressure. Your blood pressure was slightly elevated today, possibly due to recent medication intake. We will recheck your blood pressure after the medication has taken effect.  INSTRUCTIONS: Please follow up with the lung cancer screening program for an annual low-dose CT scan. Additionally, schedule an echocardiogram and a follow-up appointment with your cardiologist to discuss your AFib and any necessary medication adjustments. Recheck your blood pressure as advised.  Orders: Recheck BP Echocardiogram Refer lung cancer screening program Sample Trelegy x 2 weeks, let us  know if this helps and we can send in prescription- if not resume regular dose   Follow-up 6 months with Dr. Dione Franks

## 2024-01-17 NOTE — Progress Notes (Signed)
 @Patient  ID: Teresa Benitez, female    DOB: 1956-02-20, 68 y.o.   MRN: [id number]  Chief Complaint  Patient presents with   Follow-up    COPD and Pulm. Nodule     Referring provider: Avelina Bode, DO  HPI: 68 year old female, current everyday smoker.  Past medical history significant for COPD, A-fib, breast cancer, seasonal allergies. Former patient of Dr. Matilde Son, following with Dr. Dione Franks- last seen June 2024.   01/17/2024 Discussed the use of AI scribe software for clinical note transcription with the patient, who gave verbal consent to proceed.  History of Present Illness   Teresa Benitez is a 68 year old female with COPD and asthma who presents for follow-up of her pulmonary condition.  She has a history of COPD with an asthmatic component, with pulmonary function testing in July 2021 showing a lung function of 68%, indicating mild obstruction. She responds to bronchodilators and is currently using a Trelegy inhaler daily, along with Singulair  and albuterol  as needed. She notes variability in symptom control, attributing some fluctuations to pollen exposure, and experiences dyspnea when climbing stairs, especially when carrying loads. No chest tightness, wheezing, or cough.  She has a significant smoking history, having smoked for approximately 50 years, starting at age 79. She has reduced her smoking from a pack and a half per day to about seven cigarettes a day and is actively working on cutting back further. She has not participated in a lung cancer screening program despite her smoking history.  In June of the previous year, a chest x-ray showed stable, mild enlargement of the heart and mild chronic interstitial lung disease or congestion. A CT scan in August revealed a stable right upper lobe nodule that has been present since 2020, with a recommendation for a two-year follow-up noncontrast CT.  She has a history of breast cancer, for which she underwent chemotherapy and six weeks  of daily radiation. She is currently in remission but has been informed of a greater than 50% chance of recurrence. Her last CT scan for follow-up was in November of the previous year.  She also has a history of atrial fibrillation, discovered incidentally during a follow-up visit. She is on Eliquis and metoprolol  for rate control and has not experienced any hospitalizations or symptoms of palpitations. She is unsure if her shortness of breath is related to her AFib, as she cannot tell when she is in AFib.      Allergies  Allergen Reactions   Ampicillin Hives, Shortness Of Breath and Swelling    Lips swelling Has patient had a PCN reaction causing immediate rash, facial/tongue/throat swelling, SOB or lightheadedness with hypotension: Yes Has patient had a PCN reaction causing severe rash involving mucus membranes or skin necrosis: No Has patient had a PCN reaction that required hospitalization: No Has patient had a PCN reaction occurring within the last 10 years: Yes If all of the above answers are "NO", then may proceed with Cephalosporin use.    Carbamazepine      Nausea, headache   Ciprofloxacin Hives   Dilantin  [Phenytoin  Sodium Extended]     nausea   Sulfamethoxazole Hives    Immunization History  Administered Date(s) Administered   Influenza,inj,Quad PF,6+ Mos 08/27/2017, 06/30/2018, 07/19/2021, 06/11/2022   Influenza,inj,quad, With Preservative 06/18/2019   Moderna Sars-Covid-2 Vaccination 03/03/2020, 03/31/2020   PNEUMOCOCCAL CONJUGATE-20 04/12/2021   Pneumococcal Polysaccharide-23 10/01/2018    Past Medical History:  Diagnosis Date   Atrial fib/flutter, transient (HCC)    Carpal tunnel syndrome, bilateral  08/26/2018   Chest pain    negative myoview 02/14/2015   Chronic constipation    Cirrhosis (HCC)    COPD (chronic obstructive pulmonary disease) (HCC)    COVID-19 05/2021   DDD (degenerative disc disease), lumbar    Depression    Fibromyalgia    Hepatitis C 2004    treated, none since 2016   History of kidney stones    Hypertension    Hypothyroidism    Peripheral neuropathy 08/26/2018   Spleen enlarged    Thrombocytopenia (HCC)    Vision abnormalities     Tobacco History: Social History   Tobacco Use  Smoking Status Every Day   Current packs/day: 0.50   Average packs/day: 0.5 packs/day for 44.0 years (22.0 ttl pk-yrs)   Types: Cigarettes  Smokeless Tobacco Never  Tobacco Comments   Smokes 0.25 packs of cigarettes daily AB, CMA 01-17-24   Ready to quit: Not Answered Counseling given: Not Answered Tobacco comments: Smokes 0.25 packs of cigarettes daily AB, CMA 01-17-24   Outpatient Medications Prior to Visit  Medication Sig Dispense Refill   albuterol  (PROVENTIL ) (2.5 MG/3ML) 0.083% nebulizer solution Take 3 mLs (2.5 mg total) by nebulization every 6 (six) hours as needed for wheezing or shortness of breath. 360 mL 5   albuterol  (VENTOLIN  HFA) 108 (90 Base) MCG/ACT inhaler Inhale 2 puffs into the lungs every 6 (six) hours as needed for wheezing or shortness of breath. 8 g 5   amLODipine  (NORVASC ) 5 MG tablet Take 5 mg by mouth daily.     anastrozole (ARIMIDEX) 1 MG tablet Take 1 tablet by mouth daily.     atorvastatin  (LIPITOR) 10 MG tablet Take 1 tablet by mouth daily.     BELBUCA  150 MCG FILM 450 mcg 2 (two) times daily.   0   Cholecalciferol (VITAMIN D PO) Take 1,000 Units by mouth daily.     dicyclomine  (BENTYL ) 10 MG capsule TAKE 1 CAPSULE (10 MG TOTAL) BY MOUTH 3 (THREE) TIMES DAILY AS NEEDED FOR SPASMS. 270 capsule 1   ELIQUIS 5 MG TABS tablet Take 5 mg by mouth 2 (two) times daily.     ferrous sulfate 325 (65 FE) MG tablet Take 325 mg by mouth daily with breakfast.     gabapentin  (NEURONTIN ) 600 MG tablet Take by mouth 3 (three) times daily. 2 tabs bid     levothyroxine  (SYNTHROID ) 137 MCG tablet Take 137 mcg by mouth daily before breakfast.     lisinopril  (ZESTRIL ) 40 MG tablet Take 40 mg by mouth daily.     methocarbamol   (ROBAXIN ) 500 MG tablet Take 500-750 mg by mouth 2 (two) times daily as needed for muscle spasms (takes 1 tablet in the morning and 1.5 tablet at bedtime). Takes a dose every morning and another dose at bedtime only if needed for pain     mirtazapine  (REMERON ) 15 MG tablet TAKE 1 TABLET BY MOUTH EVERY DAY AT NIGHT  2   montelukast  (SINGULAIR ) 10 MG tablet TAKE 1 TABLET BY MOUTH EVERYDAY AT BEDTIME 90 tablet 3   MOVANTIK  12.5 MG TABS tablet TAKE 1 TABLET BY MOUTH EVERY DAY 90 tablet 3   Multiple Vitamin (MULTI-VITAMINS) TABS Take 1 tablet by mouth daily.     naproxen  sodium (ANAPROX ) 220 MG tablet Take 440 mg by mouth daily as needed (pain).     ondansetron  (ZOFRAN ) 4 MG tablet Take 1 tablet (4 mg total) by mouth every 8 (eight) hours as needed for nausea or vomiting. Patient takes  in the morning. 30 tablet 1   oxyCODONE  (OXY IR/ROXICODONE ) 5 MG immediate release tablet Take 10 mg by mouth every 8 (eight) hours as needed for moderate pain or severe pain (depends on pain level if takes 1-3 tablets).      pantoprazole  (PROTONIX ) 40 MG tablet TAKE 1 TABLET BY MOUTH EVERY DAY BEFORE BREAKFAST 90 tablet 1   promethazine  (PHENERGAN ) 25 MG tablet TAKE 1 TABLET BY MOUTH EVERY 8 HOURS AS NEEDED FOR NAUSEA AND VOMITING 30 tablet 0   TRELEGY ELLIPTA  100-62.5-25 MCG/ACT AEPB TAKE 1 PUFF BY MOUTH EVERY DAY 60 each 1   vitamin B-12 (CYANOCOBALAMIN) 500 MCG tablet Take 1,000 mcg by mouth daily.     metFORMIN (GLUCOPHAGE-XR) 500 MG 24 hr tablet Take 500 mg by mouth daily with breakfast.     No facility-administered medications prior to visit.   Review of Systems  Review of Systems  Constitutional: Negative.   HENT: Negative.    Respiratory:  Positive for shortness of breath. Negative for cough, chest tightness and wheezing.   Cardiovascular: Negative.    Physical Exam  BP (!) 175/80 (BP Location: Right Arm, Patient Position: Sitting, Cuff Size: Large) Comment: Pt stated it has only been 1 hour since she has  takenher BP meds  Pulse 78   Temp 98.5 F (36.9 C) (Oral)   Ht 5' 10.5" (1.791 m)   Wt 214 lb (97.1 kg)   SpO2 97%   BMI 30.27 kg/m  Physical Exam Constitutional:      General: She is not in acute distress.    Appearance: Normal appearance. She is not ill-appearing.  HENT:     Head: Normocephalic and atraumatic.     Mouth/Throat:     Mouth: Mucous membranes are moist.     Pharynx: Oropharynx is clear.  Cardiovascular:     Rate and Rhythm: Normal rate and regular rhythm.  Pulmonary:     Effort: Pulmonary effort is normal.     Breath sounds: Normal breath sounds. No wheezing or rhonchi.  Musculoskeletal:        General: Normal range of motion.  Skin:    General: Skin is warm and dry.  Neurological:     General: No focal deficit present.     Mental Status: She is alert and oriented to person, place, and time. Mental status is at baseline.  Psychiatric:        Mood and Affect: Mood normal.        Behavior: Behavior normal.        Thought Content: Thought content normal.        Judgment: Judgment normal.      Lab Results:  CBC    Component Value Date/Time   WBC 5.0 08/08/2023 1341   RBC 4.28 08/08/2023 1341   HGB 13.2 08/08/2023 1341   HCT 39.3 08/08/2023 1341   PLT 98 (L) 08/08/2023 1341   MCV 91.8 08/08/2023 1341   MCH 30.8 08/08/2023 1341   MCHC 33.6 08/08/2023 1341   RDW 15.7 (H) 08/08/2023 1341   LYMPHSABS 1,488 07/09/2022 1549   MONOABS 0.7 02/04/2007 1521   EOSABS 130 08/08/2023 1341   BASOSABS 40 08/08/2023 1341    BMET    Component Value Date/Time   NA 141 08/08/2023 1341   NA 146 (H) 04/29/2019 1200   K 3.6 08/08/2023 1341   CL 101 08/08/2023 1341   CO2 34 (H) 08/08/2023 1341   GLUCOSE 102 (H) 08/08/2023 1341   BUN 12  08/08/2023 1341   BUN 9 04/29/2019 1200   CREATININE 0.61 08/08/2023 1341   CALCIUM 9.5 08/08/2023 1341   GFRNONAA 95 04/29/2019 1200   GFRAA 109 04/29/2019 1200    BNP No results found for: "BNP"  ProBNP No results  found for: "PROBNP"  Imaging: No results found.   Assessment & Plan:   1. Lung nodule - Ambulatory Referral for Lung Cancer Scre  2. Tobacco use - Ambulatory Referral for Lung Cancer Scre  3. Atrial fibrillation, unspecified type (HCC) - ECHOCARDIOGRAM COMPLETE; Future  4. Heart murmur - ECHOCARDIOGRAM COMPLETE; Future  5. COPD with asthma (HCC) (Primary)    Assessment and Plan    Chronic obstructive pulmonary disease with asthma COPD with asthma component, managed with Trelegy, Singulair , and albuterol . Variable symptom control, mild obstruction on PFT with bronchodilator response. Continues smoking, reduced to seven cigarettes daily. - Provide higher strength Trelegy inhaler sample to assess symptom improvement.  Lung nodule Right upper lobe lung nodule since 2020. Last CT in August 2024. Recommended two-year follow-up CT for stability. Discussed lung cancer screening eligibility due to 50 pack-year smoking history. - Refer to lung cancer screening program for annual low-dose CT scan.  Atrial fibrillation AFib on Eliquis and metoprolol . No recent hospitalizations or symptomatic palpitations. SOB may relate to AFib. Heart murmur detected on exam. - Order echocardiogram to evaluate heart murmur. - Nurse to perform walking test for heart rate response. - Follow up with cardiologist for potential medication adjustment.  Hypertension Hypertension on lisinopril  and metoprolol . BP slightly elevated, possibly due to medication timing. - Recheck blood pressure     Antonio Baumgarten, NP 01/17/2024

## 2024-01-17 NOTE — Addendum Note (Signed)
 Addended by: Anise Kerns T on: 01/17/2024 09:10 AM   Modules accepted: Orders

## 2024-01-20 ENCOUNTER — Ambulatory Visit (INDEPENDENT_AMBULATORY_CARE_PROVIDER_SITE_OTHER): Admitting: Gastroenterology

## 2024-02-02 ENCOUNTER — Other Ambulatory Visit: Payer: Self-pay | Admitting: Internal Medicine

## 2024-03-02 ENCOUNTER — Encounter (INDEPENDENT_AMBULATORY_CARE_PROVIDER_SITE_OTHER): Payer: Self-pay | Admitting: *Deleted

## 2024-03-07 ENCOUNTER — Emergency Department (HOSPITAL_COMMUNITY)

## 2024-03-07 ENCOUNTER — Inpatient Hospital Stay (HOSPITAL_COMMUNITY)
Admission: EM | Admit: 2024-03-07 | Discharge: 2024-03-10 | DRG: 481 | Disposition: A | Discharge status: Skilled Nursing Facility | Attending: Student | Admitting: Student

## 2024-03-07 ENCOUNTER — Other Ambulatory Visit: Payer: Self-pay

## 2024-03-07 ENCOUNTER — Encounter (HOSPITAL_COMMUNITY): Payer: Self-pay

## 2024-03-07 DIAGNOSIS — Z87442 Personal history of urinary calculi: Secondary | ICD-10-CM

## 2024-03-07 DIAGNOSIS — E1142 Type 2 diabetes mellitus with diabetic polyneuropathy: Secondary | ICD-10-CM | POA: Diagnosis present

## 2024-03-07 DIAGNOSIS — W19XXXA Unspecified fall, initial encounter: Secondary | ICD-10-CM | POA: Diagnosis not present

## 2024-03-07 DIAGNOSIS — Z7984 Long term (current) use of oral hypoglycemic drugs: Secondary | ICD-10-CM | POA: Diagnosis not present

## 2024-03-07 DIAGNOSIS — S72142A Displaced intertrochanteric fracture of left femur, initial encounter for closed fracture: Secondary | ICD-10-CM | POA: Diagnosis present

## 2024-03-07 DIAGNOSIS — Z801 Family history of malignant neoplasm of trachea, bronchus and lung: Secondary | ICD-10-CM

## 2024-03-07 DIAGNOSIS — F17210 Nicotine dependence, cigarettes, uncomplicated: Secondary | ICD-10-CM | POA: Diagnosis present

## 2024-03-07 DIAGNOSIS — E1165 Type 2 diabetes mellitus with hyperglycemia: Secondary | ICD-10-CM | POA: Diagnosis present

## 2024-03-07 DIAGNOSIS — Z79899 Other long term (current) drug therapy: Secondary | ICD-10-CM

## 2024-03-07 DIAGNOSIS — E039 Hypothyroidism, unspecified: Secondary | ICD-10-CM | POA: Diagnosis present

## 2024-03-07 DIAGNOSIS — D696 Thrombocytopenia, unspecified: Secondary | ICD-10-CM | POA: Diagnosis present

## 2024-03-07 DIAGNOSIS — Z853 Personal history of malignant neoplasm of breast: Secondary | ICD-10-CM

## 2024-03-07 DIAGNOSIS — Z7951 Long term (current) use of inhaled steroids: Secondary | ICD-10-CM

## 2024-03-07 DIAGNOSIS — Z8616 Personal history of COVID-19: Secondary | ICD-10-CM

## 2024-03-07 DIAGNOSIS — E66811 Obesity, class 1: Secondary | ICD-10-CM | POA: Diagnosis present

## 2024-03-07 DIAGNOSIS — W010XXA Fall on same level from slipping, tripping and stumbling without subsequent striking against object, initial encounter: Secondary | ICD-10-CM | POA: Diagnosis present

## 2024-03-07 DIAGNOSIS — G894 Chronic pain syndrome: Secondary | ICD-10-CM | POA: Diagnosis present

## 2024-03-07 DIAGNOSIS — M797 Fibromyalgia: Secondary | ICD-10-CM | POA: Diagnosis present

## 2024-03-07 DIAGNOSIS — Z9189 Other specified personal risk factors, not elsewhere classified: Secondary | ICD-10-CM

## 2024-03-07 DIAGNOSIS — Z880 Allergy status to penicillin: Secondary | ICD-10-CM

## 2024-03-07 DIAGNOSIS — Y92009 Unspecified place in unspecified non-institutional (private) residence as the place of occurrence of the external cause: Secondary | ICD-10-CM | POA: Diagnosis not present

## 2024-03-07 DIAGNOSIS — I480 Paroxysmal atrial fibrillation: Secondary | ICD-10-CM | POA: Diagnosis present

## 2024-03-07 DIAGNOSIS — S7222XA Displaced subtrochanteric fracture of left femur, initial encounter for closed fracture: Secondary | ICD-10-CM | POA: Diagnosis present

## 2024-03-07 DIAGNOSIS — Z8249 Family history of ischemic heart disease and other diseases of the circulatory system: Secondary | ICD-10-CM

## 2024-03-07 DIAGNOSIS — I10 Essential (primary) hypertension: Secondary | ICD-10-CM | POA: Diagnosis present

## 2024-03-07 DIAGNOSIS — K7460 Unspecified cirrhosis of liver: Secondary | ICD-10-CM | POA: Diagnosis present

## 2024-03-07 DIAGNOSIS — Z79811 Long term (current) use of aromatase inhibitors: Secondary | ICD-10-CM | POA: Diagnosis not present

## 2024-03-07 DIAGNOSIS — E785 Hyperlipidemia, unspecified: Secondary | ICD-10-CM | POA: Diagnosis present

## 2024-03-07 DIAGNOSIS — Z79890 Hormone replacement therapy (postmenopausal): Secondary | ICD-10-CM

## 2024-03-07 DIAGNOSIS — Y92019 Unspecified place in single-family (private) house as the place of occurrence of the external cause: Secondary | ICD-10-CM

## 2024-03-07 DIAGNOSIS — D62 Acute posthemorrhagic anemia: Secondary | ICD-10-CM | POA: Diagnosis not present

## 2024-03-07 DIAGNOSIS — Z8619 Personal history of other infectious and parasitic diseases: Secondary | ICD-10-CM

## 2024-03-07 DIAGNOSIS — I2510 Atherosclerotic heart disease of native coronary artery without angina pectoris: Secondary | ICD-10-CM | POA: Diagnosis not present

## 2024-03-07 DIAGNOSIS — Z7901 Long term (current) use of anticoagulants: Secondary | ICD-10-CM

## 2024-03-07 DIAGNOSIS — Z881 Allergy status to other antibiotic agents status: Secondary | ICD-10-CM

## 2024-03-07 DIAGNOSIS — S72002A Fracture of unspecified part of neck of left femur, initial encounter for closed fracture: Secondary | ICD-10-CM | POA: Diagnosis not present

## 2024-03-07 DIAGNOSIS — Z833 Family history of diabetes mellitus: Secondary | ICD-10-CM | POA: Diagnosis not present

## 2024-03-07 DIAGNOSIS — J449 Chronic obstructive pulmonary disease, unspecified: Secondary | ICD-10-CM | POA: Diagnosis present

## 2024-03-07 DIAGNOSIS — Z888 Allergy status to other drugs, medicaments and biological substances status: Secondary | ICD-10-CM

## 2024-03-07 DIAGNOSIS — M25552 Pain in left hip: Secondary | ICD-10-CM | POA: Diagnosis present

## 2024-03-07 DIAGNOSIS — Z6830 Body mass index (BMI) 30.0-30.9, adult: Secondary | ICD-10-CM

## 2024-03-07 DIAGNOSIS — Z882 Allergy status to sulfonamides status: Secondary | ICD-10-CM

## 2024-03-07 LAB — CBC WITH DIFFERENTIAL/PLATELET
Abs Immature Granulocytes: 0.08 10*3/uL — ABNORMAL HIGH (ref 0.00–0.07)
Basophils Absolute: 0 10*3/uL (ref 0.0–0.1)
Basophils Relative: 0 %
Eosinophils Absolute: 0 10*3/uL (ref 0.0–0.5)
Eosinophils Relative: 0 %
HCT: 39.1 % (ref 36.0–46.0)
Hemoglobin: 13 g/dL (ref 12.0–15.0)
Immature Granulocytes: 1 %
Lymphocytes Relative: 8 %
Lymphs Abs: 1.3 10*3/uL (ref 0.7–4.0)
MCH: 32.3 pg (ref 26.0–34.0)
MCHC: 33.2 g/dL (ref 30.0–36.0)
MCV: 97 fL (ref 80.0–100.0)
Monocytes Absolute: 1 10*3/uL (ref 0.1–1.0)
Monocytes Relative: 6 %
Neutro Abs: 13.4 10*3/uL — ABNORMAL HIGH (ref 1.7–7.7)
Neutrophils Relative %: 85 %
Platelets: 106 10*3/uL — ABNORMAL LOW (ref 150–400)
RBC: 4.03 MIL/uL (ref 3.87–5.11)
RDW: 14.6 % (ref 11.5–15.5)
WBC: 15.8 10*3/uL — ABNORMAL HIGH (ref 4.0–10.5)
nRBC: 0 % (ref 0.0–0.2)

## 2024-03-07 LAB — VITAMIN D 25 HYDROXY (VIT D DEFICIENCY, FRACTURES): Vit D, 25-Hydroxy: 40.3 ng/mL (ref 30–100)

## 2024-03-07 LAB — COMPREHENSIVE METABOLIC PANEL WITH GFR
ALT: 18 U/L (ref 0–44)
AST: 35 U/L (ref 15–41)
Albumin: 3 g/dL — ABNORMAL LOW (ref 3.5–5.0)
Alkaline Phosphatase: 97 U/L (ref 38–126)
Anion gap: 8 (ref 5–15)
BUN: 21 mg/dL (ref 8–23)
CO2: 27 mmol/L (ref 22–32)
Calcium: 9.5 mg/dL (ref 8.9–10.3)
Chloride: 101 mmol/L (ref 98–111)
Creatinine, Ser: 0.77 mg/dL (ref 0.44–1.00)
GFR, Estimated: >60 mL/min (ref >60)
Glucose, Bld: 309 mg/dL — ABNORMAL HIGH (ref 70–99)
Potassium: 4 mmol/L (ref 3.5–5.1)
Sodium: 136 mmol/L (ref 135–145)
Total Bilirubin: 0.9 mg/dL (ref 0.0–1.2)
Total Protein: 5.9 g/dL — ABNORMAL LOW (ref 6.5–8.1)

## 2024-03-07 LAB — APTT: aPTT: 35 s (ref 24–36)

## 2024-03-07 LAB — HEPARIN LEVEL (UNFRACTIONATED): Heparin Unfractionated: 1.03 [IU]/mL — ABNORMAL HIGH (ref 0.30–0.70)

## 2024-03-07 LAB — GLUCOSE, CAPILLARY
Glucose-Capillary: 263 mg/dL — ABNORMAL HIGH (ref 70–99)
Glucose-Capillary: 204 mg/dL — ABNORMAL HIGH (ref 70–99)
Glucose-Capillary: 261 mg/dL — ABNORMAL HIGH (ref 70–99)
Glucose-Capillary: 230 mg/dL — ABNORMAL HIGH (ref 70–99)

## 2024-03-07 LAB — TYPE AND SCREEN
ABO/RH(D): O POS
Antibody Screen: NEGATIVE

## 2024-03-07 LAB — SURGICAL PCR SCREEN
MRSA, PCR: NEGATIVE
Staphylococcus aureus: NEGATIVE

## 2024-03-07 LAB — PROTIME-INR
INR: 1.2 (ref 0.8–1.2)
Prothrombin Time: 15.4 s — ABNORMAL HIGH (ref 11.4–15.2)

## 2024-03-07 LAB — MAGNESIUM: Magnesium: 1.8 mg/dL (ref 1.7–2.4)

## 2024-03-07 MED ORDER — AMLODIPINE BESYLATE 5 MG PO TABS
5.0000 mg | ORAL_TABLET | Freq: Every day | ORAL | Status: DC
  Administered 2024-03-08 – 2024-03-10 (×3): 5 mg via ORAL
  Filled 2024-03-07 (×4): qty 1

## 2024-03-07 MED ORDER — HYDROMORPHONE HCL 1 MG/ML IJ SOLN
0.5000 mg | INTRAMUSCULAR | Status: DC | PRN
  Administered 2024-03-07 – 2024-03-10 (×6): 0.5 mg via INTRAVENOUS
  Filled 2024-03-07 (×8): qty 0.5

## 2024-03-07 MED ORDER — ATORVASTATIN CALCIUM 10 MG PO TABS
10.0000 mg | ORAL_TABLET | Freq: Every day | ORAL | Status: DC
  Administered 2024-03-08 – 2024-03-10 (×3): 10 mg via ORAL
  Filled 2024-03-07 (×4): qty 1

## 2024-03-07 MED ORDER — LISINOPRIL 20 MG PO TABS
40.0000 mg | ORAL_TABLET | Freq: Every day | ORAL | Status: DC
  Administered 2024-03-07 – 2024-03-10 (×4): 40 mg via ORAL
  Filled 2024-03-07 (×4): qty 2

## 2024-03-07 MED ORDER — METFORMIN HCL ER 500 MG PO TB24
500.0000 mg | ORAL_TABLET | Freq: Every day | ORAL | Status: DC
  Administered 2024-03-08 – 2024-03-10 (×3): 500 mg via ORAL
  Filled 2024-03-07 (×5): qty 1

## 2024-03-07 MED ORDER — HEPARIN (PORCINE) 25000 UT/250ML-% IV SOLN
1400.0000 [IU]/h | INTRAVENOUS | Status: DC
  Administered 2024-03-08: 1400 [IU]/h via INTRAVENOUS
  Filled 2024-03-07 (×2): qty 250

## 2024-03-07 MED ORDER — BUPRENORPHINE 10 MCG/HR TD PTWK
2.0000 | MEDICATED_PATCH | TRANSDERMAL | Status: DC
  Filled 2024-03-07: qty 2

## 2024-03-07 MED ORDER — BUPRENORPHINE 10 MCG/HR TD PTWK: 2.0000 | MEDICATED_PATCH | TRANSDERMAL | Status: DC

## 2024-03-07 MED ORDER — MONTELUKAST SODIUM 10 MG PO TABS
10.0000 mg | ORAL_TABLET | Freq: Every day | ORAL | Status: DC
  Administered 2024-03-08 – 2024-03-09 (×2): 10 mg via ORAL
  Filled 2024-03-07 (×3): qty 1

## 2024-03-07 MED ORDER — HYDROMORPHONE HCL 1 MG/ML IJ SOLN
1.0000 mg | Freq: Once | INTRAMUSCULAR | Status: AC
  Filled 2024-03-07: qty 1

## 2024-03-07 MED ORDER — GABAPENTIN 300 MG PO CAPS
1200.0000 mg | ORAL_CAPSULE | Freq: Three times a day (TID) | ORAL | Status: DC
  Filled 2024-03-07 (×3): qty 4

## 2024-03-07 MED ORDER — CHOLECALCIFEROL 10 MCG (400 UNIT) PO TABS
400.0000 [IU] | ORAL_TABLET | Freq: Every day | ORAL | Status: DC
  Administered 2024-03-08 – 2024-03-10 (×3): 400 [IU] via ORAL
  Filled 2024-03-07 (×4): qty 1

## 2024-03-07 MED ORDER — INSULIN ASPART 100 UNIT/ML IJ SOLN
0.0000 [IU] | Freq: Three times a day (TID) | INTRAMUSCULAR | Status: DC
  Administered 2024-03-08: 1 [IU] via SUBCUTANEOUS

## 2024-03-07 MED ORDER — OXYCODONE HCL 5 MG PO TABS: 10.0000 mg | ORAL_TABLET | Freq: Four times a day (QID) | ORAL | Status: DC | PRN

## 2024-03-07 MED ORDER — FAMOTIDINE 20 MG PO TABS
20.0000 mg | ORAL_TABLET | Freq: Two times a day (BID) | ORAL | Status: DC
  Administered 2024-03-08 – 2024-03-10 (×5): 20 mg via ORAL
  Filled 2024-03-07 (×7): qty 1

## 2024-03-07 MED ORDER — BUPRENORPHINE 20 MCG/HR TD PTWK: 1.0000 | MEDICATED_PATCH | TRANSDERMAL | Status: DC

## 2024-03-07 MED ORDER — LEVOTHYROXINE SODIUM 25 MCG PO TABS
137.0000 ug | ORAL_TABLET | Freq: Every day | ORAL | Status: DC
  Administered 2024-03-08 – 2024-03-10 (×3): 137 ug via ORAL
  Filled 2024-03-07 (×4): qty 1

## 2024-03-07 MED ORDER — NALOXONE HCL 0.4 MG/ML IJ SOLN: 0.4000 mg | INTRAMUSCULAR | Status: DC | PRN

## 2024-03-07 MED ORDER — SERTRALINE HCL 100 MG PO TABS
100.0000 mg | ORAL_TABLET | Freq: Every day | ORAL | Status: DC
  Administered 2024-03-08 – 2024-03-10 (×3): 100 mg via ORAL
  Filled 2024-03-07 (×4): qty 1

## 2024-03-07 MED ORDER — BUDESON-GLYCOPYRROL-FORMOTEROL 160-9-4.8 MCG/ACT IN AERO
2.0000 | INHALATION_SPRAY | Freq: Two times a day (BID) | RESPIRATORY_TRACT | Status: DC
  Administered 2024-03-07 – 2024-03-10 (×5): 2 via RESPIRATORY_TRACT
  Filled 2024-03-07: qty 5.9

## 2024-03-07 MED ORDER — OXYCODONE HCL 10 MG PO TABS: 10.0000 mg | ORAL_TABLET | Freq: Three times a day (TID) | ORAL | Status: DC | PRN

## 2024-03-07 MED ORDER — ACETAMINOPHEN 325 MG PO TABS: 650.0000 mg | ORAL_TABLET | Freq: Four times a day (QID) | ORAL | Status: DC | PRN

## 2024-03-07 MED ORDER — OXYCODONE HCL 5 MG PO TABS
15.0000 mg | ORAL_TABLET | Freq: Four times a day (QID) | ORAL | Status: DC | PRN
  Administered 2024-03-08 – 2024-03-10 (×5): 15 mg via ORAL
  Filled 2024-03-07 (×7): qty 3

## 2024-03-07 MED ORDER — METOPROLOL SUCCINATE ER 25 MG PO TB24
50.0000 mg | ORAL_TABLET | Freq: Every day | ORAL | Status: DC
  Administered 2024-03-08 – 2024-03-10 (×3): 50 mg via ORAL
  Filled 2024-03-07 (×4): qty 2

## 2024-03-07 MED ORDER — MELATONIN 3 MG PO TABS
3.0000 mg | ORAL_TABLET | Freq: Every evening | ORAL | Status: DC | PRN
  Administered 2024-03-08: 3 mg via ORAL
  Filled 2024-03-07: qty 1

## 2024-03-07 MED ORDER — ALBUTEROL SULFATE (2.5 MG/3ML) 0.083% IN NEBU: 2.5000 mg | INHALATION_SOLUTION | Freq: Four times a day (QID) | RESPIRATORY_TRACT | Status: DC | PRN

## 2024-03-07 MED ORDER — MUPIROCIN 2 % EX OINT
1.0000 | TOPICAL_OINTMENT | Freq: Two times a day (BID) | CUTANEOUS | Status: DC
  Administered 2024-03-08 – 2024-03-10 (×4): 1 via NASAL
  Filled 2024-03-07 (×3): qty 22

## 2024-03-07 MED ORDER — MIRTAZAPINE 15 MG PO TABS
30.0000 mg | ORAL_TABLET | Freq: Every day | ORAL | Status: DC
  Administered 2024-03-08 – 2024-03-09 (×2): 30 mg via ORAL
  Filled 2024-03-07 (×3): qty 2

## 2024-03-07 MED ORDER — OXYCODONE HCL 5 MG PO TABS
10.0000 mg | ORAL_TABLET | Freq: Four times a day (QID) | ORAL | Status: DC | PRN
  Filled 2024-03-07: qty 2

## 2024-03-07 MED ORDER — NICOTINE 14 MG/24HR TD PT24
14.0000 mg | MEDICATED_PATCH | Freq: Every day | TRANSDERMAL | Status: DC
  Administered 2024-03-08 – 2024-03-10 (×3): 14 mg via TRANSDERMAL
  Filled 2024-03-07 (×4): qty 1

## 2024-03-07 MED ORDER — METHOCARBAMOL 750 MG PO TABS: 750.0000 mg | ORAL_TABLET | Freq: Every day | ORAL | Status: DC

## 2024-03-07 MED ORDER — IBUPROFEN 400 MG PO TABS
400.0000 mg | ORAL_TABLET | Freq: Four times a day (QID) | ORAL | Status: DC | PRN
  Filled 2024-03-07: qty 1

## 2024-03-07 MED ORDER — ROPINIROLE HCL 0.5 MG PO TABS
0.5000 mg | ORAL_TABLET | Freq: Three times a day (TID) | ORAL | Status: DC
  Administered 2024-03-08 – 2024-03-10 (×7): 0.5 mg via ORAL
  Filled 2024-03-07 (×11): qty 1

## 2024-03-07 MED ORDER — VITAMIN B-12 1000 MCG PO TABS
1000.0000 ug | ORAL_TABLET | Freq: Every day | ORAL | Status: DC
  Administered 2024-03-08 – 2024-03-10 (×3): 1000 ug via ORAL
  Filled 2024-03-07 (×4): qty 1

## 2024-03-07 MED ORDER — ENSURE PLUS HIGH PROTEIN PO LIQD
237.0000 mL | Freq: Three times a day (TID) | ORAL | Status: DC
  Administered 2024-03-08 – 2024-03-10 (×3): 237 mL via ORAL

## 2024-03-07 MED ORDER — ONDANSETRON HCL 4 MG/2ML IJ SOLN
4.0000 mg | Freq: Four times a day (QID) | INTRAMUSCULAR | Status: DC | PRN
  Administered 2024-03-10: 4 mg via INTRAVENOUS
  Filled 2024-03-07: qty 2

## 2024-03-07 MED ORDER — HYDROMORPHONE HCL 1 MG/ML IJ SOLN
0.5000 mg | INTRAMUSCULAR | Status: DC | PRN
  Filled 2024-03-07: qty 0.5

## 2024-03-07 MED ORDER — PANTOPRAZOLE SODIUM 40 MG PO TBEC
40.0000 mg | DELAYED_RELEASE_TABLET | Freq: Every day | ORAL | Status: DC
  Administered 2024-03-08 – 2024-03-10 (×3): 40 mg via ORAL
  Filled 2024-03-07 (×3): qty 1

## 2024-03-07 MED ORDER — SODIUM CHLORIDE 0.9% FLUSH
3.0000 mL | Freq: Two times a day (BID) | INTRAVENOUS | Status: DC
  Administered 2024-03-08 – 2024-03-10 (×5): 3 mL via INTRAVENOUS

## 2024-03-07 MED ORDER — METHOCARBAMOL 750 MG PO TABS
750.0000 mg | ORAL_TABLET | Freq: Three times a day (TID) | ORAL | Status: DC
  Administered 2024-03-08 – 2024-03-10 (×7): 750 mg via ORAL
  Filled 2024-03-07 (×10): qty 1

## 2024-03-07 MED ORDER — ACETAMINOPHEN 650 MG RE SUPP: 650.0000 mg | Freq: Four times a day (QID) | RECTAL | Status: DC | PRN

## 2024-03-07 NOTE — Progress Notes (Signed)
  Carryover admission to the Day Admitter.  I discussed this case with the EDP, Dr. Luberta Ruse.  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  This fall was not associated with any loss of consciousness, and the patient did not hit her head as a component of this fall.  She is chronically anticoagulated on Eliquis.  Plain films of the left femur, pelvis, showed mildly displaced intertrochanteric left hip fracture.  EDP d/w on-call orthopedic surgery, Dr. Hiram Lukes of [name], who conveyed that [facility] will formally consult. Dr. Hiram Lukes anticipates surgery for the left hip fx to occur on Sunday 6/15 (as opposed to Saturday 6/14), and requests that the patient be made NPO at midnight on Sunday 6/15.   I have placed an order for inpatient admission to med/tele for further evaluation management of the above.  I have placed some additional preliminary admit orders via the adult multi-morbid admission order set. I have also ordered fall precautions, as needed Dilaudid, as needed Zofran , incentive spirometry, type and screen.  Will also ordered a magnesium level as well as INR.      Camelia Cavalier, DO Hospitalist

## 2024-03-07 NOTE — Progress Notes (Addendum)
 PHARMACY - ANTICOAGULATION CONSULT NOTE  Pharmacy Consult for Heparin  (Apixaban on hold) Indication: atrial fibrillation  Allergies  Allergen Reactions   Ampicillin Hives, Shortness Of Breath and Swelling    Lips swelling Has patient had a PCN reaction causing immediate rash, facial/tongue/throat swelling, SOB or lightheadedness with hypotension: Yes Has patient had a PCN reaction causing severe rash involving mucus membranes or skin necrosis: No Has patient had a PCN reaction that required hospitalization: No Has patient had a PCN reaction occurring within the last 10 years: Yes If all of the above answers are NO, then may proceed with Cephalosporin use.    Ciprofloxacin Hives   Sulfamethoxazole Hives   Carbamazepine      Nausea, headache   Dilantin  [Phenytoin  Sodium Extended]     nausea   Anastrozole Other (See Comments)    Fatigue, hot flashes    Patient Measurements: Height: 5' 10 (177.8 cm) Weight: 96.4 kg (212 lb 8.4 oz) IBW/kg (Calculated) : 68.5 HEPARIN  DW (KG): 88.9  Vital Signs: Temp: 98.6 F (37 C) (06/14 1609) Temp Source: Oral (06/14 0753) BP: 115/64 (06/14 1609) Pulse Rate: 67 (06/14 1609)  Labs: Recent Labs    03/07/24 0124 03/07/24 0333 03/07/24 1809  HGB 13.0  --   --   HCT 39.1  --   --   PLT 106*  --   --   APTT  --   --  35  LABPROT  --  15.4*  --   INR  --  1.2  --   HEPARINUNFRC  --   --  1.03*  CREATININE 0.77  --   --     Estimated Creatinine Clearance: 87 mL/min (by C-G formula based on SCr of 0.77 mg/dL).   Assessment: 68 y/o F with left hip fracture, on apixaban PTA for afib, last dose 6/13 at 2100. Holding apixaban and starting heparin  pending orthopedic surgical procedure.   Given recent apixaban use, will monitor anticoagulation using aPTT until aPTT and heparin  levels correlate.  aPTT is subtherapeutic at 35 sec on 1100 units/hr. No bleeding or infusion issues per RN. Hgb is normal, platelets are low (hx cirrhosis).  Heparin  level is 1.03 and inaccurate given recent apixaban.  Goal of Therapy:  Heparin  level 0.3-0.7 units/ml aPTT 66-102 seconds Monitor platelets by anticoagulation protocol: Yes   Plan:  Increase heparin  drip to 1400 units/hr  aPTT in 8 hours Daily heparin  level, aPTT, CBC Monitor for s/sx of bleeding  Thank you for involving pharmacy in this patient's care.  Caroline Cinnamon, PharmD, BCPS Clinical Pharmacist Clinical phone for 03/07/2024 is [phone] 03/07/2024 7:09 PM

## 2024-03-07 NOTE — H&P (Cosign Needed Addendum)
 History and Physical    Patient: Teresa Benitez:[id number] DOB: 1956-05-12 DOA: 03/07/2024 DOS: the patient was seen and examined on 03/07/2024 PCP: Avelina Bode, DO  Patient coming from: Home  Chief Complaint: Left hip fracture  HPI: Teresa Benitez is a 68 y.o. female with medical history significant of chronic atrial fibrillation on Eliquis, dyslipidemia, hypertension, cirrhosis without ascites, COPD not oxygen dependent, ongoing tobacco abuse, diabetes mellitus on metformin, BMI greater than 30, chronic pain secondary to fibromyalgia on multiple medications, hypertension, hypothyroidism.  Teresa Benitez fell at home and was reporting left-sided leg and hip pain.  No reported dizziness or palpitations prior to fall.  Imaging done after arrival revealed comminuted mildly displaced intertrochanteric left hip fracture with apex lateral angulation of the dominant fracture fragments and a mildly displaced lesser trochanteric fragment.  Orthopedic surgeon consulted by the EDP and plans are to proceed with surgical intervention on Sunday 6/15 noting patient on Eliquis that needs to be immediately discontinued.  Hospitalist service has been asked to evaluate the patient for admission.  Upon my evaluation of the patient CNA attempted to elevate the head of the bed but this resulted/reproduce severe left hip and leg pain.  At this juncture patient unable to sit up.  Patient clarified all of her home medications.  States has chronic pain secondary to severe fibromyalgia.  Typically does not utilize a nicotine patch while in the hospital.  Does not utilize oxygen at home for her COPD.  States she normally is prescribed Oxycodone  10 mg every 8 hours as needed but does not take regularly.  Review of Systems: As mentioned in the history of present illness. All other systems reviewed and are negative. Past Medical History:  Diagnosis Date   Atrial fib/flutter, transient (HCC)    Carpal tunnel syndrome,  bilateral 08/26/2018   Chest pain    negative myoview 02/14/2015   Chronic constipation    Cirrhosis (HCC)    COPD (chronic obstructive pulmonary disease) (HCC)    COVID-19 05/2021   DDD (degenerative disc disease), lumbar    Depression    Fibromyalgia    Hepatitis C 2004   treated, none since 2016   History of kidney stones    Hypertension    Hypothyroidism    Peripheral neuropathy 08/26/2018   Spleen enlarged    Thrombocytopenia (HCC)    Vision abnormalities    Past Surgical History:  Procedure Laterality Date   CARDIAC CATHETERIZATION     with stent   CESAREAN SECTION  1996   COLONOSCOPY  5-6 years ago    Done In [place]   ESOPHAGOGASTRODUODENOSCOPY (EGD) WITH PROPOFOL  N/A 08/23/2017   Procedure: ESOPHAGOGASTRODUODENOSCOPY (EGD) WITH PROPOFOL ;  Surgeon: Ruby Corporal, MD;  Location: [facility];  Service: Endoscopy;  Laterality: N/A;   Para Thyhoid  2005   PARTIAL HYSTERECTOMY     Patient states that she had 16 years ago?1998   UPPER GASTROINTESTINAL ENDOSCOPY     Social History:  reports that she has been smoking cigarettes. She has a 22 pack-year smoking history. She has never used smokeless tobacco. She reports that she does not drink alcohol and does not use drugs.  Allergies  Allergen Reactions   Ampicillin Hives, Shortness Of Breath and Swelling    Lips swelling Has patient had a PCN reaction causing immediate rash, facial/tongue/throat swelling, SOB or lightheadedness with hypotension: Yes Has patient had a PCN reaction causing severe rash involving mucus membranes or skin necrosis: No Has patient had a PCN  reaction that required hospitalization: No Has patient had a PCN reaction occurring within the last 10 years: Yes If all of the above answers are NO, then may proceed with Cephalosporin use.    Ciprofloxacin Hives   Sulfamethoxazole Hives   Carbamazepine      Nausea, headache   Dilantin  [Phenytoin  Sodium Extended]     nausea   Anastrozole Other  (See Comments)    Fatigue, hot flashes    Family History  Problem Relation Age of Onset   Healthy Mother    Diabetes Father    Heart disease Father    Hypertension Father    Healthy Brother    Healthy Brother    Healthy Daughter    Healthy Son    Lung cancer Maternal Uncle     Prior to Admission medications   Medication Sig Start Date End Date Taking? Authorizing Provider  albuterol  (PROVENTIL ) (2.5 MG/3ML) 0.083% nebulizer solution Take 3 mLs (2.5 mg total) by nebulization every 6 (six) hours as needed for wheezing or shortness of breath. 09/12/21  Yes Wilder Handy, MD  albuterol  (VENTOLIN  HFA) 108 (90 Base) MCG/ACT inhaler Inhale 2 puffs into the lungs every 6 (six) hours as needed for wheezing or shortness of breath. 08/09/21  Yes Sood, Vineet, MD  amLODipine  (NORVASC ) 5 MG tablet Take 5 mg by mouth daily. 03/26/19  Yes [provider]  apixaban (ELIQUIS) 5 MG TABS tablet Take 5 mg by mouth 2 (two) times daily.   Yes [provider]  atorvastatin  (LIPITOR) 10 MG tablet Take 1 tablet by mouth daily. 05/26/19  Yes [provider]  buprenorphine  (BUTRANS) 20 MCG/HR PTWK Place 1 patch onto the skin once a week. Change patch on Thursday. 02/27/24  Yes [provider]  Cholecalciferol 25 MCG (1000 UT) capsule Take 2,000 Units by mouth daily.   Yes [provider]  cyanocobalamin 1000 MCG tablet Take 1,000 mcg by mouth daily.   Yes [provider]  diclofenac Sodium (VOLTAREN) 1 % GEL Apply 2 g topically every 6 (six) hours as needed (knee pain). 05/23/23 05/22/24 Yes [provider]  dicyclomine  (BENTYL ) 10 MG capsule TAKE 1 CAPSULE (10 MG TOTAL) BY MOUTH 3 (THREE) TIMES DAILY AS NEEDED FOR SPASMS. 06/18/22  Yes Carlan, Chelsea L, NP  famotidine  (PEPCID ) 20 MG tablet Take 20 mg by mouth 2 (two) times daily. 01/03/24  Yes [provider]  Fluticasone-Umeclidin-Vilant (TRELEGY ELLIPTA ) 200-62.5-25 MCG/ACT AEPB Inhale 1 puff into  the lungs daily. 01/17/24  Yes Antonio Baumgarten, NP  gabapentin  (NEURONTIN ) 600 MG tablet Take 1,200 mg by mouth 3 (three) times daily. Take 2 tablets (1200mg ) by mouth three times daily   Yes [provider]  Glycerin-Hypromellose-PEG 400 (DRY EYE RELIEF DROPS) 0.2-0.2-1 % SOLN Place 1 drop into both eyes as needed (dry eyes).   Yes [provider]  hydrochlorothiazide  (HYDRODIURIL ) 25 MG tablet Take 25 mg by mouth daily. 01/23/24  Yes [provider]  ibuprofen  (ADVIL ) 200 MG tablet Take 400 mg by mouth every 6 (six) hours as needed for mild pain (pain score 1-3) or headache.   Yes [provider]  letrozole (FEMARA) 2.5 MG tablet Take 2.5 mg by mouth daily. 12/21/23  Yes [provider]  levothyroxine  (SYNTHROID ) 137 MCG tablet Take 137 mcg by mouth daily before breakfast.   Yes [provider]  lisinopril  (ZESTRIL ) 40 MG tablet Take 40 mg by mouth daily. 07/11/19  Yes [provider]  metFORMIN (GLUCOPHAGE-XR) 500 MG  24 hr tablet Take 500 mg by mouth daily with breakfast. 12/08/21 08/23/24 Yes [provider]  methocarbamol  (ROBAXIN ) 750 MG tablet Take 750 mg by mouth at bedtime. Takes a dose every morning and another dose at bedtime only if needed for pain   Yes [provider]  methocarbamol  (ROBAXIN ) 750 MG tablet Take 750 mg by mouth 3 (three) times daily. 02/28/24  Yes [provider]  metoprolol  succinate (TOPROL -XL) 50 MG 24 hr tablet Take 50 mg by mouth daily. 02/18/24  Yes [provider]  mirtazapine  (REMERON ) 30 MG tablet Take 30 mg by mouth at bedtime.   Yes [provider]  montelukast  (SINGULAIR ) 10 MG tablet TAKE 1 TABLET BY MOUTH EVERYDAY AT BEDTIME 11/05/22  Yes Sood, Vineet, MD  Multiple Vitamin (MULTI-VITAMINS) TABS Take 1 tablet by mouth daily.   Yes [provider]  naloxone (NARCAN) nasal spray 4 mg/0.1 mL Place 0.4 mg into the nose once. 07/28/22  Yes [provider]  ondansetron  (ZOFRAN ) 8 MG tablet Take 8 mg by mouth every 8 (eight) hours as needed for nausea. 09/11/23  Yes [provider]  Oxycodone  HCl 10 MG TABS Take 10 mg by mouth 3 (three) times daily as needed.   Yes [provider]  pantoprazole  (PROTONIX ) 40 MG tablet TAKE 1 TABLET BY MOUTH EVERY DAY BEFORE BREAKFAST 09/23/23  Yes Carlan, Chelsea L, NP  potassium chloride (KLOR-CON M) 10 MEQ tablet Take 10 mEq by mouth daily. 11/29/23  Yes [provider]  promethazine  (PHENERGAN ) 25 MG tablet TAKE 1 TABLET BY MOUTH EVERY 8 HOURS AS NEEDED FOR NAUSEA AND VOMITING Patient taking differently: Take 25 mg by mouth every 8 (eight) hours as needed for nausea or vomiting. Take one tablet (25mg ) by mouth three times per day as needed. Take only if ondansetron  does not work. 09/13/22  Yes Urban Garden, MD  rOPINIRole (REQUIP) 0.5 MG tablet Take 0.5 mg by mouth 3 (three) times daily. 01/22/24  Yes [provider]  sertraline  (ZOLOFT ) 100 MG tablet Take 100 mg by mouth daily. 03/04/24  Yes [provider]  Varenicline Tartrate, Starter, 0.5 MG X 11 & 1 MG X 42 TBPK Take by mouth. 02/18/24  Yes [provider]    Physical Exam: Vitals:   03/07/24 0333 03/07/24 0335 03/07/24 0427 03/07/24 0500  BP:  (!) 126/101 (!) 111/98   Pulse:  66 64   Resp:  17 19   Temp: 98.6 F (37 C)  98.4 F (36.9 C)   TempSrc: Oral  Oral   SpO2:  99% 95%   Weight:   95.1 kg 96.4 kg  Height:    5' 10 (1.778 m)   Constitutional: In acute distress secondary to severe left hip and leg pain Respiratory: clear to auscultation bilaterally, no wheezing, no crackles. Normal respiratory effort. No accessory muscle use.  Room air; O2 sats 95 to 99% Cardiovascular: Regular rate and rhythm, no murmurs / rubs / gallops. No extremity edema. 2+ pedal pulses. No carotid bruits.  Abdomen: no tenderness, no masses palpated. No hepatosplenomegaly. Bowel sounds positive.   Musculoskeletal: no clubbing / cyanosis-slight shortening LLE deformity with normal upper and lower extremities otherwise. Good ROM, no contractures. Normal muscle tone.  Significant pain reproduced with any type of movement or palpation over upper part of left lower extremity Skin: no rashes, lesions, ulcers. No induration Neurologic: CN 2-12 grossly intact. Sensation intact, DTR normal. Strength 5/5 all extremities noting left leg not  tested due to pain underlying fracture.  Psychiatric: Normal judgment and insight. Alert and oriented x 3.  Anxious mood secondary to difficulty in managing acute pain  Data Reviewed:  Sodium 136, potassium 4.0, chloride 101, CO2 27, glucose 209, BUN 21, creatinine 0.77, magnesium 1.8, albumin 3.0 and total protein 5.9, GFR greater than 60  WBC 15,800 with neutrophils 85%, hemoglobin 13, platelets 106,000  Assessment and Plan: Left hip fracture after mechanical fall OR planned for Sunday 6/15-NPO after MN Hold Eliquis preoperatively Continue home Butrans patch Continue home Oxycodone  but increase as needed dosing from every 8 to every 6 hours Continue Robaxin  and gabapentin  as well as Requip Continue low-dose IV Dilaudid for breakthrough pain not controlled with as needed oxycodone  Continue home Robaxin  Continue as needed ibuprofen  as per home regimen Continue home bedtime medications to aid in sleep/Remeron  as well as home melatonin Continue Zoloft  to treat psychological component to chronic pain Preoperative medical evaluation [place] Gillis Perioperative cardiac risk: 0.3% risk Merlyn Starring criteria: Low risk-0.9% Recommend: No further workup   AF on Eliquis Cont tele monitoring-currently maintaining sinus rhythm Continue metoprolol  Keep K >4.0 Pharmacy consulted to dose Heparin  IV while Eliquis on hold  Thrombocytopenia Ongoing intermittent problem with readings varying between 98,000 and 138,000 Currently no signs of active bleeding  Diabetes mellitus on  metformin Continue metformin for now Follow CBGs and provide SSI  COPD Mild and not O2 dep Continue home medications: Breztri has been substituted for Trelegy and will utilize albuterol  nebs as needed; continue Singulair   HTN Continue Norvasc  and lisinopril   Chronic pain secondary to fibromyalgia on buprenorphine  Cont home dose Supplemental narcs for acute pain as above  Hypothyroidism Continue Synthroid   Dyslipidemia Continue Lipitor  Tobacco abuse Declined nicotine patch Smokes 1/2 pack/day  Potential for mild protein calorie malnutrition/obesity BMI 30.49 Due to inability to sit upright and severe pain patient unable to eat Have ordered protein shakes 3 times daily    Advance Care Planning:   Code Status: Full Code   VTE prophylaxis: IV Heparin   Consults: Ortho  Family Communication: Patient only  Severity of Illness: The appropriate patient status for this patient is INPATIENT. Inpatient status is judged to be reasonable and necessary in order to provide the required intensity of service to ensure the patient's safety. The patient's presenting symptoms, physical exam findings, and initial radiographic and laboratory data in the context of their chronic comorbidities is felt to place them at high risk for further clinical deterioration. Furthermore, it is not anticipated that the patient will be medically stable for discharge from the hospital within 2 midnights of admission.   * I certify that at the point of admission it is my clinical judgment that the patient will require inpatient hospital care spanning beyond 2 midnights from the point of admission due to high intensity of service, high risk for further deterioration and high frequency of surveillance required.*  Author: Kathye Parkin, NP 03/07/2024 6:47 AM  For on call review [email or web address].

## 2024-03-07 NOTE — Progress Notes (Signed)
 Case discussed with emergency department provider, Dr. Luberta Ruse.  Left peritrochanteric hip fracture will need IM nail.  No surgery planned for today given anticoagulated state.  Will need to discuss further with orthopedic surgery colleagues for timing of surgery.  Full consultation to come later today.  She may have a diet at this time.  Appreciate medicine for admission.

## 2024-03-07 NOTE — ED Provider Notes (Signed)
 [facility] EMERGENCY DEPARTMENT AT [facility] Provider Note   CSN: [id number] Arrival date & time: 03/07/24  0013     Patient presents with: No chief complaint on file.   Teresa Benitez is a 68 y.o. female.  68 year old female who fell.  Sounds like it is mostly loss of balance situation but did not hit her head or syncopized.  She felt and heard a snap in her left hip.  Called EMS and brought here for further evaluation.  Patient not complaining of pain anywhere else.  Has significant history for A-fib, breast cancer, hepatitis, cirrhosis and multiple other medical problems.        Prior to Admission medications   Medication Sig Start Date End Date Taking? Authorizing Provider  albuterol  (PROVENTIL ) (2.5 MG/3ML) 0.083% nebulizer solution Take 3 mLs (2.5 mg total) by nebulization every 6 (six) hours as needed for wheezing or shortness of breath. 09/12/21  Yes Sood, Vineet, MD  albuterol  (VENTOLIN  HFA) 108 (90 Base) MCG/ACT inhaler Inhale 2 puffs into the lungs every 6 (six) hours as needed for wheezing or shortness of breath. 08/09/21  Yes Sood, Vineet, MD  amLODipine  (NORVASC ) 5 MG tablet Take 5 mg by mouth daily. 03/26/19  Yes [provider]  apixaban (ELIQUIS) 5 MG TABS tablet Take 5 mg by mouth 2 (two) times daily.   Yes [provider]  atorvastatin  (LIPITOR) 10 MG tablet Take 1 tablet by mouth daily. 05/26/19  Yes [provider]  buprenorphine  (BUTRANS) 20 MCG/HR PTWK Place 1 patch onto the skin once a week. Change patch on Thursday. 02/27/24  Yes [provider]  Cholecalciferol 25 MCG (1000 UT) capsule Take 2,000 Units by mouth daily.   Yes [provider]  cyanocobalamin 1000 MCG tablet Take 1,000 mcg by mouth daily.   Yes [provider]  diclofenac Sodium (VOLTAREN) 1 % GEL Apply 2 g topically every 6 (six) hours as needed (knee pain). 05/23/23 05/22/24 Yes [provider]  dicyclomine  (BENTYL ) 10 MG  capsule TAKE 1 CAPSULE (10 MG TOTAL) BY MOUTH 3 (THREE) TIMES DAILY AS NEEDED FOR SPASMS. 06/18/22  Yes Carlan, Chelsea L, NP  famotidine  (PEPCID ) 20 MG tablet Take 20 mg by mouth 2 (two) times daily. 01/03/24  Yes [provider]  Fluticasone-Umeclidin-Vilant (TRELEGY ELLIPTA ) 200-62.5-25 MCG/ACT AEPB Inhale 1 puff into the lungs daily. 01/17/24  Yes Antonio Baumgarten, NP  gabapentin  (NEURONTIN ) 600 MG tablet Take 1,200 mg by mouth 3 (three) times daily. Take 2 tablets (1200mg ) by mouth three times daily   Yes [provider]  Glycerin-Hypromellose-PEG 400 (DRY EYE RELIEF DROPS) 0.2-0.2-1 % SOLN Place 1 drop into both eyes as needed (dry eyes).   Yes [provider]  hydrochlorothiazide  (HYDRODIURIL ) 25 MG tablet Take 25 mg by mouth daily. 01/23/24  Yes [provider]  ibuprofen  (ADVIL ) 200 MG tablet Take 400 mg by mouth every 6 (six) hours as needed for mild pain (pain score 1-3) or headache.   Yes [provider]  letrozole (FEMARA) 2.5 MG tablet Take 2.5 mg by mouth daily. 12/21/23  Yes [provider]  levothyroxine  (SYNTHROID ) 137 MCG tablet Take 137 mcg by mouth daily before breakfast.   Yes [provider]  lisinopril  (ZESTRIL ) 40 MG tablet Take 40 mg by mouth daily. 07/11/19  Yes [provider]  metFORMIN (GLUCOPHAGE-XR) 500 MG 24 hr tablet Take 500 mg by mouth daily with breakfast. 12/08/21 08/23/24 Yes [provider]  methocarbamol  (ROBAXIN ) 750  MG tablet Take 750 mg by mouth at bedtime. Takes a dose every morning and another dose at bedtime only if needed for pain   Yes [provider]  methocarbamol  (ROBAXIN ) 750 MG tablet Take 750 mg by mouth 3 (three) times daily. 02/28/24  Yes [provider]  metoprolol  succinate (TOPROL -XL) 50 MG 24 hr tablet Take 50 mg by mouth daily. 02/18/24  Yes [provider]  mirtazapine  (REMERON ) 30 MG tablet Take 30 mg by mouth at bedtime.   Yes [provider]  montelukast  (SINGULAIR ) 10 MG tablet TAKE 1 TABLET BY MOUTH EVERYDAY AT BEDTIME 11/05/22  Yes Sood, Vineet, MD  Multiple Vitamin (MULTI-VITAMINS) TABS Take 1 tablet by mouth daily.   Yes [provider]  naloxone (NARCAN) nasal spray 4 mg/0.1 mL Place 0.4 mg into the nose once. 07/28/22  Yes [provider]  ondansetron  (ZOFRAN ) 8 MG tablet Take 8 mg by mouth every 8 (eight) hours as needed for nausea. 09/11/23  Yes [provider]  Oxycodone  HCl 10 MG TABS Take 10 mg by mouth 3 (three) times daily as needed.   Yes [provider]  pantoprazole  (PROTONIX ) 40 MG tablet TAKE 1 TABLET BY MOUTH EVERY DAY BEFORE BREAKFAST 09/23/23  Yes Carlan, Chelsea L, NP  potassium chloride (KLOR-CON M) 10 MEQ tablet Take 10 mEq by mouth daily. 11/29/23  Yes [provider]  promethazine  (PHENERGAN ) 25 MG tablet TAKE 1 TABLET BY MOUTH EVERY 8 HOURS AS NEEDED FOR NAUSEA AND VOMITING Patient taking differently: Take 25 mg by mouth every 8 (eight) hours as needed for nausea or vomiting. Take one tablet (25mg ) by mouth three times per day as needed. Take only if ondansetron  does not work. 09/13/22  Yes Urban Garden, MD  rOPINIRole (REQUIP) 0.5 MG tablet Take 0.5 mg by mouth 3 (three) times daily. 01/22/24  Yes [provider]  sertraline  (ZOLOFT ) 100 MG tablet Take 100 mg by mouth daily. 03/04/24  Yes [provider]  Varenicline Tartrate, Starter, 0.5 MG X 11 & 1 MG X 42 TBPK Take by mouth. 02/18/24  Yes [provider]    Allergies: Ampicillin, Ciprofloxacin, Sulfamethoxazole, Carbamazepine , Dilantin  [phenytoin  sodium extended], and Anastrozole    Review of Systems  Updated Vital Signs BP (!) 111/98 (BP Location: Right Arm)   Pulse 64   Temp 98.4 F (36.9 C) (Oral)   Resp 19   Ht 5' 10 (1.778 m)   Wt 96.4 kg   SpO2 95%   BMI 30.49 kg/m   Physical Exam Vitals and nursing note reviewed.  Constitutional:       Appearance: She is well-developed.  HENT:     Head: Normocephalic and atraumatic.   Cardiovascular:     Rate and Rhythm: Normal rate and regular rhythm.  Pulmonary:     Effort: No respiratory distress.     Breath sounds: No stridor.  Abdominal:     General: There is no distension.   Musculoskeletal:        General: No swelling or tenderness. Normal range of motion.     Cervical back: Normal range of motion.   Skin:    General: Skin is warm and dry.   Neurological:     General: No focal deficit present.     Mental Status: She is alert.     (all labs ordered are listed, but only abnormal results are displayed) Labs Reviewed  CBC WITH DIFFERENTIAL/PLATELET - Abnormal; Notable for the following components:  Result Value   WBC 15.8 (*)    Platelets 106 (*)    Neutro Abs 13.4 (*)    Abs Immature Granulocytes 0.08 (*)    All other components within normal limits  COMPREHENSIVE METABOLIC PANEL WITH GFR - Abnormal; Notable for the following components:   Glucose, Bld 309 (*)    Total Protein 5.9 (*)    Albumin 3.0 (*)    All other components within normal limits  PROTIME-INR - Abnormal; Notable for the following components:   Prothrombin Time 15.4 (*)    All other components within normal limits  MAGNESIUM  TYPE AND SCREEN    EKG: None  Radiology: DG Femur Min 2 Views Left Result Date: 03/07/2024 EXAM: 2 VIEW(S) XRAY OF THE LEFT FEMUR 03/07/2024 01:42:27 AM COMPARISON: None available. CLINICAL HISTORY: Eval for fx. Fall; Left hip pain. FINDINGS: BONES AND JOINTS: Comminuted, mildly displaced intertrochanteric left hip fracture with apex lateral angulation of the dominant fracture fragments and a mildly displaced lesser trochanteric fragment. SOFT TISSUES: The soft tissues are unremarkable. IMPRESSION: 1. Comminuted, mildly displaced intertrochanteric left hip fracture, as above. Electronically signed by: Zadie Herter MD 03/07/2024 01:47 AM EDT RP Workstation:  [id number]   DG Pelvis 1-2 Views Result Date: 03/07/2024 EXAM: 1 VIEW(S) XRAY OF THE PELVIS 03/07/2024 01:42:27 AM COMPARISON: None available. CLINICAL HISTORY: Pain in left hip. Fall; Left hip pain. FINDINGS: BONES AND JOINTS: Comminuted, mildly displaced intertrochanteric left hip fracture with foreshortening and varus angulation. SOFT TISSUES: The soft tissues are unremarkable. IMPRESSION: 1. Comminuted, mildly displaced intertrochanteric left hip fracture. Electronically signed by: Zadie Herter MD 03/07/2024 01:46 AM EDT RP Workstation: [id number]     Procedures   Medications Ordered in the ED  acetaminophen (TYLENOL) tablet 650 mg (has no administration in time range)    Or  acetaminophen (TYLENOL) suppository 650 mg (has no administration in time range)  melatonin tablet 3 mg (has no administration in time range)  ondansetron  (ZOFRAN ) injection 4 mg (has no administration in time range)  naloxone (NARCAN) injection 0.4 mg (has no administration in time range)  HYDROmorphone (DILAUDID) injection 0.5 mg (0.5 mg Intravenous Given 03/07/24 0500)  insulin aspart (novoLOG) injection 0-6 Units (has no administration in time range)  buprenorphine  (BUTRANS) 10 MCG/HR 2 patch (has no administration in time range)  HYDROmorphone (DILAUDID) injection 1 mg (1 mg Intravenous Given 03/07/24 0122)  HYDROmorphone (DILAUDID) injection 1 mg (1 mg Intravenous Given 03/07/24 0330)                                Medical Decision Making Amount and/or Complexity of Data Reviewed Labs: ordered. Radiology: ordered.  Risk Prescription drug management. Decision regarding hospitalization.   D/w Dr. Hiram Lukes w/ ortho, as patient is on eliquis, plan for surgery Sunday. NPO after midnight Saturday. D/w hosptialist for admission.    Final diagnoses:  Closed displaced intertrochanteric fracture of left femur, initial encounter [facility])    ED Discharge Orders     None          Moe Brier, Reymundo Caulk,  MD 03/07/24 [phone]

## 2024-03-07 NOTE — ED Notes (Signed)
 ED Provider at bedside.

## 2024-03-07 NOTE — Progress Notes (Signed)
 Tearah Saulsbury is a 68 y.o. female patient admitted. Awake, alert - oriented  X 4 - no acute distress noted.  VSS - Blood pressure (!) 111/98, pulse 64, temperature 98.4 F (36.9 C), temperature source Oral, resp. rate 19, height 5' 10 (1.778 m), weight 96.4 kg, SpO2 95%.    IV in place, occlusive dsg intact without redness.  Orientation to room, and floor completed.  Admission INP armband ID verified with patient/family, and in place.   SR up x 2, fall assessment complete, with patient and family able to verbalize understanding of risk associated with falls, and verbalized understanding to call nsg before up out of bed.  Call light within reach, patient able to voice, and demonstrate understanding. No evidence of skin break down noted on exam.      Will cont to eval and treat per MD orders.  Arnav Cregg, RN 03/07/2024 7:06 AM

## 2024-03-07 NOTE — Consult Note (Signed)
 ORTHOPAEDIC CONSULTATION  REQUESTING PHYSICIAN: Lovell Rubenstein, NP  PCP:  Avelina Bode, DO  Chief Complaint: Left hip pain  HPI: Teresa Benitez is a 68 y.o. female who complains of left hip pain after a ground-level fall earlier this a.m.  She was found on emergency department imaging to have a displaced intertrochanteric left hip fracture.  She does have a history of cirrhosis as well as breast cancer and paroxysmal atrial fibrillation and anticoagulated on Eliquis.  Is independent with ADLs.  Denies head trauma.  Denies any recent orthopedic surgery.  Past Medical History:  Diagnosis Date   Atrial fib/flutter, transient (HCC)    Carpal tunnel syndrome, bilateral 08/26/2018   Chest pain    negative myoview 02/14/2015   Chronic constipation    Cirrhosis (HCC)    COPD (chronic obstructive pulmonary disease) (HCC)    COVID-19 05/2021   DDD (degenerative disc disease), lumbar    Depression    Fibromyalgia    Hepatitis C 2004   treated, none since 2016   History of kidney stones    Hypertension    Hypothyroidism    Peripheral neuropathy 08/26/2018   Spleen enlarged    Thrombocytopenia (HCC)    Vision abnormalities    Past Surgical History:  Procedure Laterality Date   CARDIAC CATHETERIZATION     with stent   CESAREAN SECTION  1996   COLONOSCOPY  5-6 years ago    Done In [place]   ESOPHAGOGASTRODUODENOSCOPY (EGD) WITH PROPOFOL  N/A 08/23/2017   Procedure: ESOPHAGOGASTRODUODENOSCOPY (EGD) WITH PROPOFOL ;  Surgeon: Ruby Corporal, MD;  Location: [facility];  Service: Endoscopy;  Laterality: N/A;   Para Thyhoid  2005   PARTIAL HYSTERECTOMY     Patient states that she had 16 years ago?1998   UPPER GASTROINTESTINAL ENDOSCOPY     Social History   Socioeconomic History   Marital status: Divorced    Spouse name: Not on file   Number of children: Not on file   Years of education: Not on file   Highest education level: Not on file  Occupational History   Not on  file  Tobacco Use   Smoking status: Every Day    Current packs/day: 0.50    Average packs/day: 0.5 packs/day for 44.0 years (22.0 ttl pk-yrs)    Types: Cigarettes   Smokeless tobacco: Never   Tobacco comments:    Smokes 0.25 packs of cigarettes daily AB, CMA 01-17-24  Vaping Use   Vaping status: Never Used  Substance and Sexual Activity   Alcohol use: No   Drug use: No   Sexual activity: Not on file  Other Topics Concern   Not on file  Social History Narrative   Not on file   Social Drivers of Health   Financial Resource Strain: Low Risk  (05/23/2023)   Received from [facility]   Overall Financial Resource Strain (CARDIA)    Difficulty of Paying Living Expenses: Not hard at all  Food Insecurity: No Food Insecurity (03/07/2024)   Hunger Vital Sign    Worried About Running Out of Food in the Last Year: Never true    Ran Out of Food in the Last Year: Never true  Transportation Needs: No Transportation Needs (03/07/2024)   PRAPARE - Administrator, Civil Service (Medical): No    Lack of Transportation (Non-Medical): No  Physical Activity: Unknown (05/23/2023)   Received from [facility]   Exercise Vital Sign    On average, how  many days per week do you engage in moderate to strenuous exercise (like a brisk walk)?: 0 days    Minutes of Exercise per Session: Not on file  Recent Concern: Physical Activity - Inactive (05/23/2023)   Received from [facility]   Exercise Vital Sign    Days of Exercise per Week: 0 days    Minutes of Exercise per Session: 0 min  Stress: Stress Concern Present (09/12/2023)   Received from [facility] Occupational Health - Occupational Stress Questionnaire    Feeling of Stress : Rather much  Social Connections: Socially Isolated (03/07/2024)   Social Connection and Isolation Panel    Frequency of Communication with Friends and Family: More than three times a week    Frequency of Social Gatherings with  Friends and Family: Three times a week    Attends Religious Services: Patient declined    Active Member of Clubs or Organizations: No    Attends Engineer, structural: Never    Marital Status: Divorced   Family History  Problem Relation Age of Onset   Healthy Mother    Diabetes Father    Heart disease Father    Hypertension Father    Healthy Brother    Healthy Brother    Healthy Daughter    Healthy Son    Lung cancer Maternal Uncle    Allergies  Allergen Reactions   Ampicillin Hives, Shortness Of Breath and Swelling    Lips swelling Has patient had a PCN reaction causing immediate rash, facial/tongue/throat swelling, SOB or lightheadedness with hypotension: Yes Has patient had a PCN reaction causing severe rash involving mucus membranes or skin necrosis: No Has patient had a PCN reaction that required hospitalization: No Has patient had a PCN reaction occurring within the last 10 years: Yes If all of the above answers are NO, then may proceed with Cephalosporin use.    Ciprofloxacin Hives   Sulfamethoxazole Hives   Carbamazepine      Nausea, headache   Dilantin  [Phenytoin  Sodium Extended]     nausea   Anastrozole Other (See Comments)    Fatigue, hot flashes   Prior to Admission medications   Medication Sig Start Date End Date Taking? Authorizing Provider  albuterol  (PROVENTIL ) (2.5 MG/3ML) 0.083% nebulizer solution Take 3 mLs (2.5 mg total) by nebulization every 6 (six) hours as needed for wheezing or shortness of breath. 09/12/21  Yes Wilder Handy, MD  albuterol  (VENTOLIN  HFA) 108 (90 Base) MCG/ACT inhaler Inhale 2 puffs into the lungs every 6 (six) hours as needed for wheezing or shortness of breath. 08/09/21  Yes Sood, Vineet, MD  amLODipine  (NORVASC ) 5 MG tablet Take 5 mg by mouth daily. 03/26/19  Yes [provider]  apixaban (ELIQUIS) 5 MG TABS tablet Take 5 mg by mouth 2 (two) times daily.   Yes [provider]  atorvastatin  (LIPITOR) 10 MG  tablet Take 1 tablet by mouth daily. 05/26/19  Yes [provider]  buprenorphine  (BUTRANS) 20 MCG/HR PTWK Place 1 patch onto the skin once a week. Change patch on Thursday. 02/27/24  Yes [provider]  Cholecalciferol 25 MCG (1000 UT) capsule Take 2,000 Units by mouth daily.   Yes [provider]  cyanocobalamin 1000 MCG tablet Take 1,000 mcg by mouth daily.   Yes [provider]  diclofenac Sodium (VOLTAREN) 1 % GEL Apply 2 g topically every 6 (six) hours as needed (knee pain). 05/23/23 05/22/24 Yes [provider]  dicyclomine  (  BENTYL ) 10 MG capsule TAKE 1 CAPSULE (10 MG TOTAL) BY MOUTH 3 (THREE) TIMES DAILY AS NEEDED FOR SPASMS. 06/18/22  Yes Carlan, Chelsea L, NP  famotidine  (PEPCID ) 20 MG tablet Take 20 mg by mouth 2 (two) times daily. 01/03/24  Yes [provider]  Fluticasone-Umeclidin-Vilant (TRELEGY ELLIPTA ) 200-62.5-25 MCG/ACT AEPB Inhale 1 puff into the lungs daily. 01/17/24  Yes Antonio Baumgarten, NP  gabapentin  (NEURONTIN ) 600 MG tablet Take 1,200 mg by mouth 3 (three) times daily. Take 2 tablets (1200mg ) by mouth three times daily   Yes [provider]  Glycerin-Hypromellose-PEG 400 (DRY EYE RELIEF DROPS) 0.2-0.2-1 % SOLN Place 1 drop into both eyes as needed (dry eyes).   Yes [provider]  hydrochlorothiazide  (HYDRODIURIL ) 25 MG tablet Take 25 mg by mouth daily. 01/23/24  Yes [provider]  ibuprofen  (ADVIL ) 200 MG tablet Take 400 mg by mouth every 6 (six) hours as needed for mild pain (pain score 1-3) or headache.   Yes [provider]  letrozole (FEMARA) 2.5 MG tablet Take 2.5 mg by mouth daily. 12/21/23  Yes [provider]  levothyroxine  (SYNTHROID ) 137 MCG tablet Take 137 mcg by mouth daily before breakfast.   Yes [provider]  lisinopril  (ZESTRIL ) 40 MG tablet Take 40 mg by mouth daily. 07/11/19  Yes [provider]  metFORMIN (GLUCOPHAGE-XR) 500 MG 24 hr tablet  Take 500 mg by mouth daily with breakfast. 12/08/21 08/23/24 Yes [provider]  methocarbamol  (ROBAXIN ) 750 MG tablet Take 750 mg by mouth at bedtime. Takes a dose every morning and another dose at bedtime only if needed for pain   Yes [provider]  methocarbamol  (ROBAXIN ) 750 MG tablet Take 750 mg by mouth 3 (three) times daily. 02/28/24  Yes [provider]  metoprolol  succinate (TOPROL -XL) 50 MG 24 hr tablet Take 50 mg by mouth daily. 02/18/24  Yes [provider]  mirtazapine  (REMERON ) 30 MG tablet Take 30 mg by mouth at bedtime.   Yes [provider]  montelukast  (SINGULAIR ) 10 MG tablet TAKE 1 TABLET BY MOUTH EVERYDAY AT BEDTIME 11/05/22  Yes Sood, Vineet, MD  Multiple Vitamin (MULTI-VITAMINS) TABS Take 1 tablet by mouth daily.   Yes [provider]  naloxone (NARCAN) nasal spray 4 mg/0.1 mL Place 0.4 mg into the nose once. 07/28/22  Yes [provider]  ondansetron  (ZOFRAN ) 8 MG tablet Take 8 mg by mouth every 8 (eight) hours as needed for nausea. 09/11/23  Yes [provider]  Oxycodone  HCl 10 MG TABS Take 10 mg by mouth 3 (three) times daily as needed.   Yes [provider]  pantoprazole  (PROTONIX ) 40 MG tablet TAKE 1 TABLET BY MOUTH EVERY DAY BEFORE BREAKFAST 09/23/23  Yes Carlan, Chelsea L, NP  potassium chloride (KLOR-CON M) 10 MEQ tablet Take 10 mEq by mouth daily. 11/29/23  Yes [provider]  promethazine  (PHENERGAN ) 25 MG tablet TAKE 1 TABLET BY MOUTH EVERY 8 HOURS AS NEEDED FOR NAUSEA AND VOMITING Patient taking differently: Take 25 mg by mouth every 8 (eight) hours as needed for nausea or vomiting. Take one tablet (25mg ) by mouth three times per day as needed. Take only if ondansetron  does not work. 09/13/22  Yes Urban Garden, MD  rOPINIRole (REQUIP) 0.5 MG tablet Take 0.5 mg by mouth 3 (three) times daily. 01/22/24  Yes [provider]  sertraline  (ZOLOFT ) 100 MG tablet Take  100 mg by mouth daily. 03/04/24  Yes [provider]  Varenicline Tartrate, Starter, 0.5 MG X 11 & 1 MG X 42 TBPK Take by mouth. 02/18/24  Yes [provider]   DG Femur Min 2 Views Left Result Date: 03/07/2024 EXAM: 2 VIEW(S) XRAY OF THE LEFT FEMUR 03/07/2024 01:42:27 AM COMPARISON: None available. CLINICAL HISTORY: Eval for fx. Fall; Left hip pain. FINDINGS: BONES AND JOINTS: Comminuted, mildly displaced intertrochanteric left hip fracture with apex lateral angulation of the dominant fracture fragments and a mildly displaced lesser trochanteric fragment. SOFT TISSUES: The soft tissues are unremarkable. IMPRESSION: 1. Comminuted, mildly displaced intertrochanteric left hip fracture, as above. Electronically signed by: Zadie Herter MD 03/07/2024 01:47 AM EDT RP Workstation: [id number]   DG Pelvis 1-2 Views Result Date: 03/07/2024 EXAM: 1 VIEW(S) XRAY OF THE PELVIS 03/07/2024 01:42:27 AM COMPARISON: None available. CLINICAL HISTORY: Pain in left hip. Fall; Left hip pain. FINDINGS: BONES AND JOINTS: Comminuted, mildly displaced intertrochanteric left hip fracture with foreshortening and varus angulation. SOFT TISSUES: The soft tissues are unremarkable. IMPRESSION: 1. Comminuted, mildly displaced intertrochanteric left hip fracture. Electronically signed by: Zadie Herter MD 03/07/2024 01:46 AM EDT RP Workstation: [id number]    Positive ROS: All other systems have been reviewed and were otherwise negative with the exception of those mentioned in the HPI and as above.  Physical Exam: General: Alert, no acute distress Cardiovascular: No pedal edema Respiratory: No cyanosis, no use of accessory musculature GI: No organomegaly, abdomen is soft and non-tender Skin: No lesions in the area of chief complaint Neurologic: Sensation intact distally Psychiatric: Patient is competent for consent with normal mood and affect Lymphatic: No axillary or cervical  lymphadenopathy  MUSCULOSKELETAL: Left lower extremity is somewhat shortened and externally rotated.  Otherwise neurovascular intact.  Assessment: Left hip intertrochanteric hip fracture  Plan: Will need operative management with IM nail for the unstable left hip fracture.  At this time we will let her anticoagulation washout a bit so that her bleeding risk is more reasonable.  Appreciate hospitalist assistance with admission, and pharmacy for management of anticoagulation during this perioperative time.  Anticipate surgery either Sunday or Monday pending OR schedule and surgeon availability.  Tentatively will have n.p.o. tonight at midnight.    Janeth Medicus, MD Cell [phone]    03/07/2024 11:14 AM

## 2024-03-07 NOTE — ED Triage Notes (Signed)
 Pt bib [facility] coming from home after pt had fall. Pt arrives in c-collar placed by EM with c/o left sided leg and hip pain. Denies head injury. Pt is on eliquis.   EMS VS: 150/90 70HR 99% RA 329 CBG fentanyl  250cc ns

## 2024-03-07 NOTE — Plan of Care (Signed)
  Problem: Elimination: Goal: Will not experience complications related to bowel motility Outcome: Progressing   Problem: Coping: Goal: Level of anxiety will decrease Outcome: Progressing   Problem: Pain Managment: Goal: General experience of comfort will improve and/or be controlled Outcome: Progressing   Problem: Safety: Goal: Ability to remain free from injury will improve Outcome: Progressing

## 2024-03-07 NOTE — Progress Notes (Signed)
 PHARMACY - ANTICOAGULATION CONSULT NOTE  Pharmacy Consult for Heparin  (Apixaban on hold) Indication: atrial fibrillation  Allergies  Allergen Reactions   Ampicillin Hives, Shortness Of Breath and Swelling    Lips swelling Has patient had a PCN reaction causing immediate rash, facial/tongue/throat swelling, SOB or lightheadedness with hypotension: Yes Has patient had a PCN reaction causing severe rash involving mucus membranes or skin necrosis: No Has patient had a PCN reaction that required hospitalization: No Has patient had a PCN reaction occurring within the last 10 years: Yes If all of the above answers are NO, then may proceed with Cephalosporin use.    Ciprofloxacin Hives   Sulfamethoxazole Hives   Carbamazepine      Nausea, headache   Dilantin  [Phenytoin  Sodium Extended]     nausea   Anastrozole Other (See Comments)    Fatigue, hot flashes    Patient Measurements: Height: 5' 10 (177.8 cm) Weight: 96.4 kg (212 lb 8.4 oz) IBW/kg (Calculated) : 68.5 HEPARIN  DW (KG): 88.9  Vital Signs: Temp: 98.4 F (36.9 C) (06/14 0427) Temp Source: Oral (06/14 0427) BP: 111/98 (06/14 0427) Pulse Rate: 64 (06/14 0427)  Labs: Recent Labs    03/07/24 0124 03/07/24 0333  HGB 13.0  --   HCT 39.1  --   PLT 106*  --   LABPROT  --  15.4*  INR  --  1.2  CREATININE 0.77  --     Estimated Creatinine Clearance: 87 mL/min (by C-G formula based on SCr of 0.77 mg/dL).   Medical History: Past Medical History:  Diagnosis Date   Atrial fib/flutter, transient (HCC)    Carpal tunnel syndrome, bilateral 08/26/2018   Chest pain    negative myoview 02/14/2015   Chronic constipation    Cirrhosis (HCC)    COPD (chronic obstructive pulmonary disease) (HCC)    COVID-19 05/2021   DDD (degenerative disc disease), lumbar    Depression    Fibromyalgia    Hepatitis C 2004   treated, none since 2016   History of kidney stones    Hypertension    Hypothyroidism    Peripheral neuropathy  08/26/2018   Spleen enlarged    Thrombocytopenia (HCC)    Vision abnormalities     Assessment: 68 y/o F with left hip fracture, on apixaban PTA for afib, last dose 6/13 at 2100. Holding apixaban and starting heparin  pending orthopedic surgical procedure. Anticipate using aPTT to dose for now.   Goal of Therapy:  Heparin  level 0.3-0.7 units/ml aPTT 66-102 seconds Monitor platelets by anticoagulation protocol: Yes   Plan:  Start heparin  drip at 1100 units/hr at 0900 Heparin  level and aPTT in 8 hours  Silvestre Drum, PharmD, BCPS Clinical Pharmacist Phone: [phone]

## 2024-03-07 NOTE — Hospital Course (Signed)
 Mechanical fall.  Smoking half ppd

## 2024-03-08 ENCOUNTER — Inpatient Hospital Stay (HOSPITAL_COMMUNITY)

## 2024-03-08 ENCOUNTER — Other Ambulatory Visit: Payer: Self-pay

## 2024-03-08 ENCOUNTER — Inpatient Hospital Stay (HOSPITAL_COMMUNITY): Admitting: Certified Registered Nurse Anesthetist

## 2024-03-08 ENCOUNTER — Encounter (HOSPITAL_COMMUNITY): Payer: Self-pay | Admitting: Internal Medicine

## 2024-03-08 ENCOUNTER — Encounter (HOSPITAL_COMMUNITY)
Admission: EM | Disposition: A | Discharge status: Skilled Nursing Facility | Payer: Self-pay | Source: Home / Self Care | Attending: Nurse Practitioner

## 2024-03-08 DIAGNOSIS — Z79899 Other long term (current) drug therapy: Secondary | ICD-10-CM | POA: Diagnosis not present

## 2024-03-08 DIAGNOSIS — I10 Essential (primary) hypertension: Secondary | ICD-10-CM | POA: Diagnosis not present

## 2024-03-08 DIAGNOSIS — W19XXXA Unspecified fall, initial encounter: Secondary | ICD-10-CM | POA: Diagnosis not present

## 2024-03-08 DIAGNOSIS — S72002A Fracture of unspecified part of neck of left femur, initial encounter for closed fracture: Secondary | ICD-10-CM | POA: Diagnosis not present

## 2024-03-08 DIAGNOSIS — I2510 Atherosclerotic heart disease of native coronary artery without angina pectoris: Secondary | ICD-10-CM | POA: Diagnosis not present

## 2024-03-08 DIAGNOSIS — F17210 Nicotine dependence, cigarettes, uncomplicated: Secondary | ICD-10-CM

## 2024-03-08 DIAGNOSIS — S72142A Displaced intertrochanteric fracture of left femur, initial encounter for closed fracture: Secondary | ICD-10-CM

## 2024-03-08 DIAGNOSIS — Y92009 Unspecified place in unspecified non-institutional (private) residence as the place of occurrence of the external cause: Secondary | ICD-10-CM | POA: Diagnosis not present

## 2024-03-08 HISTORY — PX: INTRAMEDULLARY (IM) NAIL INTERTROCHANTERIC: SHX5875

## 2024-03-08 LAB — GLUCOSE, CAPILLARY
Glucose-Capillary: 188 mg/dL — ABNORMAL HIGH (ref 70–99)
Glucose-Capillary: 202 mg/dL — ABNORMAL HIGH (ref 70–99)
Glucose-Capillary: 168 mg/dL — ABNORMAL HIGH (ref 70–99)
Glucose-Capillary: 208 mg/dL — ABNORMAL HIGH (ref 70–99)
Glucose-Capillary: 175 mg/dL — ABNORMAL HIGH (ref 70–99)

## 2024-03-08 LAB — ABO/RH: ABO/RH(D): O POS

## 2024-03-08 LAB — COMPREHENSIVE METABOLIC PANEL WITH GFR
ALT: 19 U/L (ref 0–44)
AST: 27 U/L (ref 15–41)
Albumin: 2.8 g/dL — ABNORMAL LOW (ref 3.5–5.0)
Alkaline Phosphatase: 64 U/L (ref 38–126)
Anion gap: 8 (ref 5–15)
BUN: 20 mg/dL (ref 8–23)
CO2: 27 mmol/L (ref 22–32)
Calcium: 9.1 mg/dL (ref 8.9–10.3)
Chloride: 99 mmol/L (ref 98–111)
Creatinine, Ser: 0.84 mg/dL (ref 0.44–1.00)
GFR, Estimated: >60 mL/min (ref >60)
Glucose, Bld: 177 mg/dL — ABNORMAL HIGH (ref 70–99)
Potassium: 3.9 mmol/L (ref 3.5–5.1)
Sodium: 134 mmol/L — ABNORMAL LOW (ref 135–145)
Total Bilirubin: 0.7 mg/dL (ref 0.0–1.2)
Total Protein: 5.6 g/dL — ABNORMAL LOW (ref 6.5–8.1)

## 2024-03-08 LAB — CBC
HCT: 31.8 % — ABNORMAL LOW (ref 36.0–46.0)
Hemoglobin: 10.7 g/dL — ABNORMAL LOW (ref 12.0–15.0)
MCH: 32.5 pg (ref 26.0–34.0)
MCHC: 33.6 g/dL (ref 30.0–36.0)
MCV: 96.7 fL (ref 80.0–100.0)
Platelets: 68 10*3/uL — ABNORMAL LOW (ref 150–400)
RBC: 3.29 MIL/uL — ABNORMAL LOW (ref 3.87–5.11)
RDW: 14.9 % (ref 11.5–15.5)
WBC: 10.2 10*3/uL (ref 4.0–10.5)
nRBC: 0 % (ref 0.0–0.2)

## 2024-03-08 LAB — HEMOGLOBIN A1C
Hgb A1c MFr Bld: 6.4 % — ABNORMAL HIGH (ref 4.8–5.6)
Mean Plasma Glucose: 136.98 mg/dL

## 2024-03-08 LAB — APTT: aPTT: 28 s (ref 24–36)

## 2024-03-08 LAB — HEPARIN LEVEL (UNFRACTIONATED): Heparin Unfractionated: 0.33 [IU]/mL (ref 0.30–0.70)

## 2024-03-08 SURGERY — FIXATION, FRACTURE, INTERTROCHANTERIC, WITH INTRAMEDULLARY ROD
Anesthesia: General | Laterality: Left

## 2024-03-08 MED ORDER — FENTANYL CITRATE (PF) 250 MCG/5ML IJ SOLN
INTRAMUSCULAR | Status: DC | PRN
  Administered 2024-03-08: 50 ug via INTRAVENOUS

## 2024-03-08 MED ORDER — OXYCODONE HCL 10 MG PO TABS: 10.0000 mg | ORAL_TABLET | Freq: Four times a day (QID) | ORAL | 0 refills | Status: DC | PRN

## 2024-03-08 MED ORDER — CEFAZOLIN SODIUM-DEXTROSE 2-4 GM/100ML-% IV SOLN
2.0000 g | INTRAVENOUS | Status: AC
Start: 2024-03-08 — End: 2024-03-08
  Administered 2024-03-08: 2 g via INTRAVENOUS

## 2024-03-08 MED ORDER — ROCURONIUM BROMIDE 10 MG/ML (PF) SYRINGE
PREFILLED_SYRINGE | INTRAVENOUS | Status: DC | PRN
  Administered 2024-03-08: 50 mg via INTRAVENOUS

## 2024-03-08 MED ORDER — DIPHENHYDRAMINE HCL 50 MG/ML IJ SOLN
INTRAMUSCULAR | Status: DC | PRN
  Administered 2024-03-08: 12.5 mg via INTRAVENOUS

## 2024-03-08 MED ORDER — LIDOCAINE 2% (20 MG/ML) 5 ML SYRINGE
INTRAMUSCULAR | Status: AC
Start: 2024-03-08 — End: 2024-03-08
  Filled 2024-03-08: qty 5

## 2024-03-08 MED ORDER — FENTANYL CITRATE (PF) 250 MCG/5ML IJ SOLN
INTRAMUSCULAR | Status: AC
  Filled 2024-03-08: qty 5

## 2024-03-08 MED ORDER — ACETAMINOPHEN 10 MG/ML IV SOLN
INTRAVENOUS | Status: DC | PRN
  Administered 2024-03-08: 1000 mg via INTRAVENOUS

## 2024-03-08 MED ORDER — INSULIN ASPART 100 UNIT/ML IJ SOLN
0.0000 [IU] | INTRAMUSCULAR | Status: DC | PRN
  Administered 2024-03-08: 4 [IU] via SUBCUTANEOUS

## 2024-03-08 MED ORDER — TRANEXAMIC ACID-NACL 1000-0.7 MG/100ML-% IV SOLN
1000.0000 mg | INTRAVENOUS | Status: AC
  Administered 2024-03-08: 1000 mg via INTRAVENOUS

## 2024-03-08 MED ORDER — CHLORHEXIDINE GLUCONATE 0.12 % MT SOLN
OROMUCOSAL | Status: AC
  Filled 2024-03-08: qty 15

## 2024-03-08 MED ORDER — INSULIN ASPART 100 UNIT/ML IJ SOLN
0.0000 [IU] | Freq: Three times a day (TID) | INTRAMUSCULAR | Status: DC
  Administered 2024-03-08: 5 [IU] via SUBCUTANEOUS
  Administered 2024-03-08 – 2024-03-09 (×3): 3 [IU] via SUBCUTANEOUS
  Administered 2024-03-09: 5 [IU] via SUBCUTANEOUS
  Administered 2024-03-10: 2 [IU] via SUBCUTANEOUS
  Administered 2024-03-10: 3 [IU] via SUBCUTANEOUS

## 2024-03-08 MED ORDER — HYDROMORPHONE HCL 1 MG/ML IJ SOLN
INTRAMUSCULAR | Status: AC
  Filled 2024-03-08: qty 1

## 2024-03-08 MED ORDER — ORAL CARE MOUTH RINSE
15.0000 mL | Freq: Once | OROMUCOSAL | Status: AC
Start: 2024-03-08 — End: 2024-03-08

## 2024-03-08 MED ORDER — HYDROMORPHONE HCL 1 MG/ML IJ SOLN
INTRAMUSCULAR | Status: DC | PRN
  Administered 2024-03-08 (×2): .5 mg via INTRAVENOUS

## 2024-03-08 MED ORDER — CHLORHEXIDINE GLUCONATE 4 % EX SOLN: 60.0000 mL | Freq: Once | CUTANEOUS | Status: DC

## 2024-03-08 MED ORDER — PROPOFOL 10 MG/ML IV BOLUS
INTRAVENOUS | Status: DC | PRN
  Administered 2024-03-08: 120 mg via INTRAVENOUS

## 2024-03-08 MED ORDER — CHLORHEXIDINE GLUCONATE 0.12 % MT SOLN
15.0000 mL | Freq: Once | OROMUCOSAL | Status: AC
  Administered 2024-03-08: 15 mL via OROMUCOSAL

## 2024-03-08 MED ORDER — LACTATED RINGERS IV SOLN: INTRAVENOUS | Status: DC

## 2024-03-08 MED ORDER — MIDAZOLAM HCL 2 MG/2ML IJ SOLN
INTRAMUSCULAR | Status: DC | PRN
  Administered 2024-03-08: 2 mg via INTRAVENOUS

## 2024-03-08 MED ORDER — CEFAZOLIN SODIUM-DEXTROSE 2-4 GM/100ML-% IV SOLN
2.0000 g | Freq: Four times a day (QID) | INTRAVENOUS | Status: AC
  Administered 2024-03-08 (×2): 2 g via INTRAVENOUS
  Filled 2024-03-08 (×2): qty 100

## 2024-03-08 MED ORDER — DEXMEDETOMIDINE HCL IN NACL 80 MCG/20ML IV SOLN
INTRAVENOUS | Status: AC
  Filled 2024-03-08: qty 20

## 2024-03-08 MED ORDER — ACETAMINOPHEN 10 MG/ML IV SOLN
INTRAVENOUS | Status: AC
  Filled 2024-03-08: qty 100

## 2024-03-08 MED ORDER — ONDANSETRON HCL 4 MG/2ML IJ SOLN
INTRAMUSCULAR | Status: DC | PRN
  Administered 2024-03-08: 4 mg via INTRAVENOUS

## 2024-03-08 MED ORDER — HYDROMORPHONE HCL 1 MG/ML IJ SOLN
0.2500 mg | INTRAMUSCULAR | Status: DC | PRN
  Administered 2024-03-08 (×2): 0.5 mg via INTRAVENOUS

## 2024-03-08 MED ORDER — PHENYLEPHRINE 80 MCG/ML (10ML) SYRINGE FOR IV PUSH (FOR BLOOD PRESSURE SUPPORT)
PREFILLED_SYRINGE | INTRAVENOUS | Status: AC
  Filled 2024-03-08: qty 10

## 2024-03-08 MED ORDER — CEFAZOLIN SODIUM-DEXTROSE 2-4 GM/100ML-% IV SOLN
INTRAVENOUS | Status: AC
  Filled 2024-03-08: qty 100

## 2024-03-08 MED ORDER — SUGAMMADEX SODIUM 200 MG/2ML IV SOLN
INTRAVENOUS | Status: DC | PRN
  Administered 2024-03-08: 200 mg via INTRAVENOUS

## 2024-03-08 MED ORDER — MIDAZOLAM HCL 2 MG/2ML IJ SOLN
INTRAMUSCULAR | Status: AC
  Filled 2024-03-08: qty 2

## 2024-03-08 MED ORDER — PHENYLEPHRINE 80 MCG/ML (10ML) SYRINGE FOR IV PUSH (FOR BLOOD PRESSURE SUPPORT)
PREFILLED_SYRINGE | INTRAVENOUS | Status: DC | PRN
  Administered 2024-03-08 (×2): 160 ug via INTRAVENOUS
  Administered 2024-03-08: 80 ug via INTRAVENOUS

## 2024-03-08 MED ORDER — LIDOCAINE 2% (20 MG/ML) 5 ML SYRINGE
INTRAMUSCULAR | Status: DC | PRN
  Administered 2024-03-08: 60 mg via INTRAVENOUS

## 2024-03-08 MED ORDER — GABAPENTIN 300 MG PO CAPS
600.0000 mg | ORAL_CAPSULE | Freq: Three times a day (TID) | ORAL | Status: DC
  Administered 2024-03-08 – 2024-03-10 (×7): 600 mg via ORAL
  Filled 2024-03-08 (×7): qty 2

## 2024-03-08 MED ORDER — POVIDONE-IODINE 10 % EX SWAB: 2.0000 | Freq: Once | CUTANEOUS | Status: DC

## 2024-03-08 MED ORDER — 0.9 % SODIUM CHLORIDE (POUR BTL) OPTIME
TOPICAL | Status: DC | PRN
  Administered 2024-03-08: 1000 mL

## 2024-03-08 MED ORDER — DOCUSATE SODIUM 100 MG PO CAPS
100.0000 mg | ORAL_CAPSULE | Freq: Two times a day (BID) | ORAL | Status: DC
  Administered 2024-03-08 (×2): 100 mg via ORAL
  Filled 2024-03-08 (×2): qty 1

## 2024-03-08 MED ORDER — ROCURONIUM BROMIDE 10 MG/ML (PF) SYRINGE
PREFILLED_SYRINGE | INTRAVENOUS | Status: AC
  Filled 2024-03-08: qty 10

## 2024-03-08 MED ORDER — ONDANSETRON HCL 4 MG/2ML IJ SOLN
INTRAMUSCULAR | Status: AC
  Filled 2024-03-08: qty 2

## 2024-03-08 MED ORDER — TRANEXAMIC ACID-NACL 1000-0.7 MG/100ML-% IV SOLN
INTRAVENOUS | Status: AC
  Filled 2024-03-08: qty 100

## 2024-03-08 MED ORDER — DEXAMETHASONE SODIUM PHOSPHATE 10 MG/ML IJ SOLN
INTRAMUSCULAR | Status: AC
Start: 2024-03-08 — End: 2024-03-08
  Filled 2024-03-08: qty 1

## 2024-03-08 MED ORDER — INSULIN ASPART 100 UNIT/ML IJ SOLN: 0.0000 [IU] | Freq: Every day | INTRAMUSCULAR | Status: DC

## 2024-03-08 MED ORDER — DEXMEDETOMIDINE HCL IN NACL 80 MCG/20ML IV SOLN
INTRAVENOUS | Status: DC | PRN
  Administered 2024-03-08: 8 ug via INTRAVENOUS

## 2024-03-08 SURGICAL SUPPLY — 37 items
ALCOHOL 70% 16 OZ (MISCELLANEOUS) ×1 IMPLANT
BAG COUNTER SPONGE SURGICOUNT (BAG) ×1 IMPLANT
BIT DRILL 4.0X280 (BIT) IMPLANT
BNDG COHESIVE 6X5 TAN ST LF (GAUZE/BANDAGES/DRESSINGS) ×2 IMPLANT
CANISTER SUCTION 3000ML PPV (SUCTIONS) ×1 IMPLANT
COVER PERINEAL POST (MISCELLANEOUS) ×1 IMPLANT
COVER SURGICAL LIGHT HANDLE (MISCELLANEOUS) ×1 IMPLANT
DRAPE C-ARM 42X72 X-RAY (DRAPES) ×1 IMPLANT
DRAPE HALF SHEET 40X57 (DRAPES) IMPLANT
DRAPE INCISE IOBAN 66X45 STRL (DRAPES) ×1 IMPLANT
DRAPE STERI IOBAN 125X83 (DRAPES) ×1 IMPLANT
DRSG ADAPTIC 3X8 NADH LF (GAUZE/BANDAGES/DRESSINGS) ×1 IMPLANT
DURAPREP 26ML APPLICATOR (WOUND CARE) ×1 IMPLANT
ELECT CAUTERY BLADE 6.4 (BLADE) ×1 IMPLANT
ELECTRODE REM PT RTRN 9FT ADLT (ELECTROSURGICAL) ×1 IMPLANT
GAUZE SPONGE 4X4 12PLY STRL (GAUZE/BANDAGES/DRESSINGS) IMPLANT
GAUZE SPONGE 4X4 12PLY STRL LF (GAUZE/BANDAGES/DRESSINGS) ×1 IMPLANT
GLOVE BIO SURGEON STRL SZ7.5 (GLOVE) ×2 IMPLANT
GLOVE BIOGEL PI IND STRL 8 (GLOVE) ×2 IMPLANT
GOWN STRL REUS W/ TWL LRG LVL3 (GOWN DISPOSABLE) ×1 IMPLANT
GOWN STRL REUS W/ TWL XL LVL3 (GOWN DISPOSABLE) ×2 IMPLANT
GUIDEWIRE ORTH 900X3XBALL NOSE (WIRE) IMPLANT
KIT BASIN OR (CUSTOM PROCEDURE TRAY) ×1 IMPLANT
KIT TURNOVER KIT B (KITS) ×1 IMPLANT
NAIL LT 10X39X125 TROCHANTERIC (Nail) IMPLANT
NS IRRIG 1000ML POUR BTL (IV SOLUTION) ×1 IMPLANT
PACK GENERAL/GYN (CUSTOM PROCEDURE TRAY) ×1 IMPLANT
PAD ARMBOARD POSITIONER FOAM (MISCELLANEOUS) ×2 IMPLANT
PIN GUIDE THRD AR 3.2X330 (PIN) IMPLANT
SCREW CORT CAPT 5.0X40 (Screw) IMPLANT
SCREW TELESCOP LAG 10.5X95 LT (Screw) IMPLANT
STAPLER SKIN PROX 35W (STAPLE) ×1 IMPLANT
SUT MON AB 2-0 CT1 36 (SUTURE) ×1 IMPLANT
TOOL ACTIVATION (INSTRUMENTS) IMPLANT
TOWEL GREEN STERILE (TOWEL DISPOSABLE) ×1 IMPLANT
TOWEL GREEN STERILE FF (TOWEL DISPOSABLE) ×1 IMPLANT
WATER STERILE IRR 1000ML POUR (IV SOLUTION) ×1 IMPLANT

## 2024-03-08 NOTE — Anesthesia Procedure Notes (Signed)
 Procedure Name: Intubation Date/Time: 03/08/2024 10:49 AM  Performed by: Melinda Sprawls, CRNAPre-anesthesia Checklist: Patient identified, Emergency Drugs available, Suction available, Patient being monitored and Timeout performed Patient Re-evaluated:Patient Re-evaluated prior to induction Oxygen Delivery Method: Circle system utilized Preoxygenation: Pre-oxygenation with 100% oxygen Induction Type: IV induction Ventilation: Mask ventilation without difficulty Laryngoscope Size: Mac and 4 Grade View: Grade I Tube type: Oral Tube size: 7.0 mm Number of attempts: 1 Airway Equipment and Method: Stylet Placement Confirmation: ETT inserted through vocal cords under direct vision, positive ETCO2 and breath sounds checked- equal and bilateral Secured at: 22 cm Tube secured with: Tape Dental Injury: Teeth and Oropharynx as per pre-operative assessment  Comments: Smooth IV Induction. Eyes taped. Easy mask. DL x 1 with grade 1 view. Atraumatically placed, teeth and lip remain intact as pre-op. Secured with tape. Bilateral breath sounds +/=, EtCO2 +, Adequate TV, VSS.

## 2024-03-08 NOTE — Anesthesia Postprocedure Evaluation (Signed)
 Anesthesia Post Note  Patient: Teresa Benitez  Procedure(s) Performed: FIXATION, FRACTURE, INTERTROCHANTERIC, WITH INTRAMEDULLARY ROD (Left)     Patient location during evaluation: PACU Anesthesia Type: General Level of consciousness: awake and alert Pain management: pain level controlled Vital Signs Assessment: post-procedure vital signs reviewed and stable Respiratory status: spontaneous breathing, nonlabored ventilation, respiratory function stable and patient connected to nasal cannula oxygen Cardiovascular status: blood pressure returned to baseline and stable Postop Assessment: no apparent nausea or vomiting Anesthetic complications: no  No notable events documented.  Last Vitals:  Vitals:   03/08/24 1230 03/08/24 1245  BP: (!) 140/67   Pulse: 73 79  Resp: 10 18  Temp:  (!) 36.2 C  SpO2: 99% 96%    Last Pain:  Vitals:   03/08/24 1245  TempSrc:   PainSc: Asleep    LLE Motor Response: Purposeful movement (03/08/24 1245) LLE Sensation: Pain;Full sensation (03/08/24 1245)          Trinadee Verhagen,W. EDMOND

## 2024-03-08 NOTE — Discharge Instructions (Signed)
 Orthopedic surgery discharge instructions:  -Okay for full weightbearing as tolerated to the left lower extremity.  He may use an assistive device as necessary.  - Apply ice liberally to the left hip for 20 to 30 minutes out of each hour that you are awake.  Should also take Tylenol around-the-clock to help with mild to moderate pain.  Otherwise use the oxycodone  as necessary for breakthrough pain.  - You can take a shower starting on postoperative day #2.  Do not remove postoperative bandages.  They become soiled or saturated you may remove and replace with daily dry dressings.  Please do not submerge your incisions underwater.  - You will resume your chronic Eliquis immediately following surgery.  - Return to see Dr. Hiram Lukes in the office in 2 weeks.

## 2024-03-08 NOTE — H&P (Signed)
 H&P update  The surgical history has been reviewed and remains accurate without interval change.  The patient was re-examined and patient's physiologic condition has not changed significantly in the last 30 days. The condition still exists that makes this procedure necessary. The treatment plan remains the same, without new options for care.  No new pharmacological allergies or types of therapy has been initiated that would change the plan or the appropriateness of the plan.  The patient and/or family understand the potential benefits and risks.   The risks, benefits, and alternatives were discussed with the patient. There are risks associated with the surgery including, but not limited to, problems with anesthesia (death), infection, differences in leg length/angulation/rotation, fracture of bones, loosening or failure of implants, malunion, nonunion, hematoma (blood accumulation) which may require surgical drainage, blood clots, pulmonary embolism, nerve injury (foot drop), and blood vessel injury. The patient understands these risks and elects to proceed.  Detavious Rinn P. Hiram Lukes, MD 03/08/2024 10:24 AM

## 2024-03-08 NOTE — Anesthesia Preprocedure Evaluation (Addendum)
 Anesthesia Evaluation  Patient identified by MRN, date of birth, ID band Patient awake    Reviewed: Allergy & Precautions, H&P , NPO status , Patient's Chart, lab work & pertinent test results, reviewed documented beta blocker date and time   Airway Mallampati: II  TM Distance: >3 FB Neck ROM: Full    Dental no notable dental hx. (+) Teeth Intact, Dental Advisory Given   Pulmonary COPD,  COPD inhaler, Current Smoker   Pulmonary exam normal breath sounds clear to auscultation       Cardiovascular hypertension, Pt. on medications and Pt. on home beta blockers + CAD and + Cardiac Stents  + dysrhythmias Atrial Fibrillation  Rhythm:Irregular Rate:Normal     Neuro/Psych    Depression    negative neurological ROS     GI/Hepatic negative GI ROS, Neg liver ROS,,,  Endo/Other  Hypothyroidism    Renal/GU negative Renal ROS  negative genitourinary   Musculoskeletal  (+)  Fibromyalgia -  Abdominal   Peds  Hematology negative hematology ROS (+)   Anesthesia Other Findings   Reproductive/Obstetrics negative OB ROS                             Anesthesia Physical Anesthesia Plan  ASA: 3  Anesthesia Plan: General   Post-op Pain Management: Ofirmev IV (intra-op)*   Induction: Intravenous  PONV Risk Score and Plan: 3 and Ondansetron  and Dexamethasone  Airway Management Planned: Oral ETT  Additional Equipment:   Intra-op Plan:   Post-operative Plan: Extubation in OR  Informed Consent: I have reviewed the patients History and Physical, chart, labs and discussed the procedure including the risks, benefits and alternatives for the proposed anesthesia with the patient or authorized representative who has indicated his/her understanding and acceptance.     Dental advisory given  Plan Discussed with: CRNA  Anesthesia Plan Comments:        Anesthesia Quick Evaluation

## 2024-03-08 NOTE — Plan of Care (Signed)
  Problem: Education: Goal: Knowledge of General Education information will improve Description: Including pain rating scale, medication(s)/side effects and non-pharmacologic comfort measures Outcome: Progressing   Problem: Health Behavior/Discharge Planning: Goal: Ability to manage health-related needs will improve Outcome: Progressing   Problem: Clinical Measurements: Goal: Ability to maintain clinical measurements within normal limits will improve Outcome: Progressing Goal: Will remain free from infection Outcome: Progressing Goal: Diagnostic test results will improve Outcome: Progressing Goal: Respiratory complications will improve Outcome: Progressing Goal: Cardiovascular complication will be avoided Outcome: Progressing   Problem: Activity: Goal: Risk for activity intolerance will decrease Outcome: Progressing   Problem: Nutrition: Goal: Adequate nutrition will be maintained Outcome: Progressing   Problem: Coping: Goal: Level of anxiety will decrease Outcome: Progressing   Problem: Elimination: Goal: Will not experience complications related to bowel motility Outcome: Progressing Goal: Will not experience complications related to urinary retention Outcome: Progressing   Problem: Pain Managment: Goal: General experience of comfort will improve and/or be controlled Outcome: Progressing   Problem: Safety: Goal: Ability to remain free from injury will improve Outcome: Progressing   Problem: Skin Integrity: Goal: Risk for impaired skin integrity will decrease Outcome: Progressing   Problem: Education: Goal: Ability to describe self-care measures that may prevent or decrease complications (Diabetes Survival Skills Education) will improve Outcome: Progressing Goal: Individualized Educational Video(s) Outcome: Progressing   Problem: Coping: Goal: Ability to adjust to condition or change in health will improve Outcome: Progressing   Problem: Fluid  Volume: Goal: Ability to maintain a balanced intake and output will improve Outcome: Progressing   Problem: Health Behavior/Discharge Planning: Goal: Ability to identify and utilize available resources and services will improve Outcome: Progressing Goal: Ability to manage health-related needs will improve Outcome: Progressing   Problem: Metabolic: Goal: Ability to maintain appropriate glucose levels will improve Outcome: Progressing   Problem: Nutritional: Goal: Maintenance of adequate nutrition will improve Outcome: Progressing Goal: Progress toward achieving an optimal weight will improve Outcome: Progressing   Problem: Skin Integrity: Goal: Risk for impaired skin integrity will decrease Outcome: Progressing   Problem: Tissue Perfusion: Goal: Adequacy of tissue perfusion will improve Outcome: Progressing   Problem: Education: Goal: Knowledge of General Education information will improve Description: Including pain rating scale, medication(s)/side effects and non-pharmacologic comfort measures Outcome: Progressing   Problem: Health Behavior/Discharge Planning: Goal: Ability to manage health-related needs will improve Outcome: Progressing   Problem: Clinical Measurements: Goal: Ability to maintain clinical measurements within normal limits will improve Outcome: Progressing Goal: Will remain free from infection Outcome: Progressing Goal: Diagnostic test results will improve Outcome: Progressing Goal: Respiratory complications will improve Outcome: Progressing Goal: Cardiovascular complication will be avoided Outcome: Progressing   Problem: Activity: Goal: Risk for activity intolerance will decrease Outcome: Progressing   Problem: Nutrition: Goal: Adequate nutrition will be maintained Outcome: Progressing   Problem: Coping: Goal: Level of anxiety will decrease Outcome: Progressing   Problem: Elimination: Goal: Will not experience complications related to  bowel motility Outcome: Progressing Goal: Will not experience complications related to urinary retention Outcome: Progressing   Problem: Pain Managment: Goal: General experience of comfort will improve and/or be controlled Outcome: Progressing   Problem: Safety: Goal: Ability to remain free from injury will improve Outcome: Progressing   Problem: Skin Integrity: Goal: Risk for impaired skin integrity will decrease Outcome: Progressing

## 2024-03-08 NOTE — Brief Op Note (Signed)
 03/08/2024  11:50 AM  PATIENT:  Teresa Benitez  68 y.o. female  PRE-OPERATIVE DIAGNOSIS:  LEFT INTERTROCHANTERIC HIP FRACTURE  POST-OPERATIVE DIAGNOSIS:  LEFT INTERTROCHANTERIC HIP FRACTURE  PROCEDURE:  Procedure(s): FIXATION, FRACTURE, INTERTROCHANTERIC, WITH INTRAMEDULLARY ROD (Left)  SURGEON:  Surgeons and Role:    * Janeth Medicus, MD - Primary  PHYSICIAN ASSISTANT: none  ASSISTANTS: none   ANESTHESIA:   general  EBL:  50 mL   BLOOD ADMINISTERED:none  DRAINS: none   LOCAL MEDICATIONS USED:  NONE  SPECIMEN:  No Specimen  DISPOSITION OF SPECIMEN:  N/A  COUNTS:  YES  TOURNIQUET:  * No tourniquets in log *  DICTATION: .Note written in EPIC  PLAN OF CARE: Admit to inpatient   PATIENT DISPOSITION:  PACU - hemodynamically stable.   Delay start of Pharmacological VTE agent (>24hrs) due to surgical blood loss or risk of bleeding: not applicable

## 2024-03-08 NOTE — Progress Notes (Signed)
 PROGRESS NOTE  Teresa Benitez AVW:[id number] DOB: Jul 24, 1956   PCP: Avelina Bode, DO  Patient is from: Home.  Uses cane and a rolling walker at baseline.  DOA: 03/07/2024 LOS: 1  Chief complaints No chief complaint on file.    Brief Narrative / Interim history: 68 year old F with PMH of A-fib on Eliquis, liver cirrhosis, COPD, DM-2, HTN, hypothyroidism, chronic pain/fibromyalgia and tobacco use disorder brought to ED due to left hip pain after she had a fall at home, and admitted with comminuted and mildly displaced intertrochanteric left hip fracture.  Orthopedic surgery consulted.  Patient is on multiple sedating medications with high risk for polypharmacy.  Patient underwent intramedullary implant by Dr. Hiram Lukes on 6/15.   Subjective: Seen and examined earlier this morning.  No major events overnight or this afternoon after she returned from surgery.  Reports left hip pain.  She rates her pain 7/10.  No other complaints.  Objective: Vitals:   03/08/24 1200 03/08/24 1215 03/08/24 1230 03/08/24 1245  BP: 135/78 (!) 145/72 (!) 140/67   Pulse: 68 68 73 79  Resp: 15 12 10 18   Temp:    (!) 97.2 F (36.2 C)  TempSrc:      SpO2: 100% 92% 99% 96%  Weight:      Height:        Examination:  GENERAL: No apparent distress.  Nontoxic. HEENT: MMM.  Vision and hearing grossly intact.  NECK: Supple.  No apparent JVD.  RESP:  No IWOB.  Fair aeration bilaterally. CVS:  RRR. Heart sounds normal.  ABD/GI/GU: BS+. Abd soft, NTND.  MSK/EXT:  Moves extremities. No apparent deformity. No edema.  SKIN: Dressing over left thigh DCI. NEURO: AA.  Oriented appropriately.  No apparent focal neuro deficit. PSYCH: Calm. Normal affect.   Consultants:  Orthopedic surgery  Procedures: 6/15-intramedullary implant for intertrochanteric, peritrochanteric and subtrochanteric fracture  Microbiology summarized: None  Assessment and plan: Accidental fall at home-likely due to polypharmacy.  On  multiple sedating medications. Left hip fracture due to accidental fall at home -S/p intramedullary implant by Dr. Hiram Lukes on 6/15 -Vitamin D level normal at 40.3. -Pain control per orthopedic surgery -Narcan as needed -Already on Eliquis for A-fib which would serve VTE prophylaxis -PT/OT   Paroxysmal A-fib: On Toprol -XL and Eliquis at home. -Continue Toprol -XL -Resume Eliquis when okay from surgical standpoint -Optimize electrolytes  NIDDM-2 with hyperglycemia Recent Labs  Lab 03/07/24 1145 03/07/24 1606 03/07/24 1939 03/08/24 0815 03/08/24 1103  GLUCAP 204* 261* 230* 188* 202*  -Increase SSI to mod -Change diet to carb modified -Continue home metformin -Recheck hemoglobin A1c  Chronic COPD: Stable -Trelegy Ellipta  for home Breztri -Continue Singulair  and as needed albuterol   Chronic pain syndrome/fibromyalgia: On Robaxin , Butrans, maximum dose of gabapentin  at home. -Decrease gabapentin  to 600 mg 3 times daily. -Continue home meds cautiously   Thrombocytopenia Ongoing intermittent problem with readings varying between 98,000 and 138,000 Currently no signs of active bleeding   Hypothyroidism -Continue Synthroid    Dyslipidemia -Continue Lipitor   Tobacco abuse: Reports smoking about half a pack a day -Encouraged smoking cessation -Continue nicotine patch  Thrombocytopenia: Mild Recent Labs  Lab 03/07/24 0124  PLT 106*  - Monitor  Polypharmacy: Butrans, Remeron , gabapentin , Bentyl , Robaxin , oxycodone ... Discussed risk of polypharmacy with patient.  At this time, she is willing to cut down on gabapentin  to 600 mg 3 times daily - Encourage patient to discuss with prescribing  Class I obesity Body mass index is 30.49 kg/m.  DVT prophylaxis:  SCDs Start: 03/08/24 1247 SCDs Start: 03/07/24 0258  Code Status: Full code Family Communication: Updated patient's son-in-law at the bedside. Level of care: Telemetry Medical Status is:  Inpatient Remains inpatient appropriate because: Hip fracture   Final disposition: To be determined   55 minutes with more than 50% spent in reviewing records, counseling patient/family and coordinating care.   Sch Meds:  Scheduled Meds:  [MAR Hold] amLODipine   5 mg Oral Daily   [MAR Hold] atorvastatin   10 mg Oral Daily   [MAR Hold] budesonide-glycopyrrolate-formoterol  2 puff Inhalation BID   [MAR Hold] buprenorphine   2 patch Transdermal Q Sat   chlorhexidine   60 mL Topical Once   [MAR Hold] cholecalciferol  400 Units Oral Daily   [MAR Hold] cyanocobalamin  1,000 mcg Oral Daily   docusate sodium  100 mg Oral BID   [MAR Hold] famotidine   20 mg Oral BID   [MAR Hold] feeding supplement  237 mL Oral TID BM   [MAR Hold] gabapentin   1,200 mg Oral TID   [MAR Hold] insulin aspart  0-6 Units Subcutaneous TID WC   [MAR Hold] levothyroxine   137 mcg Oral Q0600   [MAR Hold] lisinopril   40 mg Oral Daily   [MAR Hold] metFORMIN  500 mg Oral Q breakfast   [MAR Hold] methocarbamol   750 mg Oral TID   [MAR Hold] metoprolol  succinate  50 mg Oral Daily   [MAR Hold] mirtazapine   30 mg Oral QHS   [MAR Hold] montelukast   10 mg Oral QHS   [MAR Hold] mupirocin ointment  1 Application Nasal BID   [MAR Hold] nicotine  14 mg Transdermal Daily   [MAR Hold] pantoprazole   40 mg Oral QAC breakfast   povidone-iodine  2 Application Topical Once   [MAR Hold] rOPINIRole  0.5 mg Oral TID   [MAR Hold] sertraline   100 mg Oral Daily   [MAR Hold] sodium chloride  flush  3 mL Intravenous Q12H   Continuous Infusions:   ceFAZolin (ANCEF) IV     heparin  Stopped (03/08/24 0909)   lactated ringers  10 mL/hr at 03/08/24 0951   PRN Meds:.[MAR Hold] acetaminophen **OR** [MAR Hold] acetaminophen, [MAR Hold] albuterol , HYDROmorphone (DILAUDID) injection, [MAR Hold]  HYDROmorphone (DILAUDID) injection, [MAR Hold] ibuprofen , insulin aspart, [MAR Hold] melatonin, [MAR Hold] naLOXone (NARCAN)  injection, [MAR Hold] ondansetron   (ZOFRAN ) IV, [MAR Hold] oxyCODONE  **OR** [MAR Hold] oxyCODONE   Antimicrobials: Anti-infectives (From admission, onward)    Start     Dose/Rate Route Frequency Ordered Stop   03/08/24 1300  ceFAZolin (ANCEF) IVPB 2g/100 mL premix        2 g 200 mL/hr over 30 Minutes Intravenous Every 6 hours 03/08/24 1246 03/09/24 0059   03/08/24 1015  ceFAZolin (ANCEF) IVPB 2g/100 mL premix        2 g 200 mL/hr over 30 Minutes Intravenous On call to O.R. 03/08/24 1013 03/08/24 1111        I have personally reviewed the following labs and images: CBC: Recent Labs  Lab 03/07/24 0124  WBC 15.8*  NEUTROABS 13.4*  HGB 13.0  HCT 39.1  MCV 97.0  PLT 106*   BMP &GFR Recent Labs  Lab 03/07/24 0124 03/07/24 0333  NA 136  --   K 4.0  --   CL 101  --   CO2 27  --   GLUCOSE 309*  --   BUN 21  --   CREATININE 0.77  --   CALCIUM 9.5  --   MG  --  1.8   Estimated Creatinine Clearance: 87 mL/min (by C-G formula based on SCr of 0.77 mg/dL). Liver & Pancreas: Recent Labs  Lab 03/07/24 0124  AST 35  ALT 18  ALKPHOS 97  BILITOT 0.9  PROT 5.9*  ALBUMIN 3.0*   No results for input(s): LIPASE, AMYLASE in the last 168 hours. No results for input(s): AMMONIA in the last 168 hours. Diabetic: No results for input(s): HGBA1C in the last 72 hours. Recent Labs  Lab 03/07/24 1145 03/07/24 1606 03/07/24 1939 03/08/24 0815 03/08/24 1103  GLUCAP 204* 261* 230* 188* 202*   Cardiac Enzymes: No results for input(s): CKTOTAL, CKMB, CKMBINDEX, TROPONINI in the last 168 hours. No results for input(s): PROBNP in the last 8760 hours. Coagulation Profile: Recent Labs  Lab 03/07/24 0333  INR 1.2   Thyroid Function Tests: No results for input(s): TSH, T4TOTAL, FREET4, T3FREE, THYROIDAB in the last 72 hours. Lipid Profile: No results for input(s): CHOL, HDL, LDLCALC, TRIG, CHOLHDL, LDLDIRECT in the last 72 hours. Anemia Panel: No results for input(s):  VITAMINB12, FOLATE, FERRITIN, TIBC, IRON, RETICCTPCT in the last 72 hours. Urine analysis:    Component Value Date/Time   COLORURINE DARK YELLOW 07/11/2017 1635   APPEARANCEUR CLOUDY (A) 07/11/2017 1635   LABSPEC 1.014 07/11/2017 1635   PHURINE 8.0 07/11/2017 1635   GLUCOSEU NEGATIVE 07/11/2017 1635   GLUCOSEU NEGATIVE 02/04/2007 1521   HGBUR NEGATIVE 07/11/2017 1635   BILIRUBINUR NEGATIVE 10/21/2013 0637   KETONESUR NEGATIVE 07/11/2017 1635   PROTEINUR NEGATIVE 07/11/2017 1635   UROBILINOGEN 0.2 10/21/2013 0637   NITRITE POSITIVE (A) 07/11/2017 1635   LEUKOCYTESUR TRACE (A) 07/11/2017 1635   Sepsis Labs: Invalid input(s): PROCALCITONIN, LACTICIDVEN  Microbiology: Recent Results (from the past 240 hours)  Surgical PCR screen     Status: None   Collection Time: 03/07/24  8:53 AM   Specimen: Nasal Mucosa; Nasal Swab  Result Value Ref Range Status   MRSA, PCR NEGATIVE NEGATIVE Final   Staphylococcus aureus NEGATIVE NEGATIVE Final    Comment: (NOTE) The Xpert SA Assay (FDA approved for NASAL specimens in patients 60 years of age and older), is one component of a comprehensive surveillance program. It is not intended to diagnose infection nor to guide or monitor treatment. Performed at [facility], [address], [place], [place] [address]     Radiology Studies: DG C-Arm 1-60 Min-No Report Result Date: 03/08/2024 Fluoroscopy was utilized by the requesting physician.  No radiographic interpretation.      Creed Kail T. Zyree Traynham [facility]  If 7PM-7AM, please contact night-coverage [email or web address] 03/08/2024, 12:48 PM

## 2024-03-08 NOTE — Op Note (Signed)
 Date of Surgery: 03/08/2024  INDICATIONS: Teresa Benitez is a 68 y.o.-year-old female who sustained a left hip fracture. The risks and benefits of the procedure discussed with the patient prior to the procedure and all questions were answered; consent was obtained.  PREOPERATIVE DIAGNOSIS: left hip fracture   POSTOPERATIVE DIAGNOSIS: Same   PROCEDURE: Treatment of intertrochanteric, pertrochanteric, subtrochanteric fracture with intramedullary implant. CPT [id number]   SURGEON: Quintin Buckle. Hiram Lukes, M.D.   ANESTHESIA: general   IV FLUIDS AND URINE: See anesthesia record   ESTIMATED BLOOD LOSS: 50 cc  IMPLANTS:  Arthex 10 x 39 ES troch nail  95 mm compression screw  5.0 mm x 40 mm  DRAINS: None.   COMPLICATIONS: None.   DESCRIPTION OF PROCEDURE: The patient was brought to the operating room and placed supine on the operating table. The patient's leg had been signed prior to the procedure. The patient had the anesthesia placed by the anesthesiologist. The prep verification and incision time-outs were performed to confirm that this was the correct patient, site, side and location. The patient had an SCD on the opposite lower extremity. The patient did receive antibiotics prior to the incision and was re-dosed during the procedure as needed at indicated intervals. The patient was positioned on the fracture table with the table in traction and internal rotation to reduce the hip. The well leg was placed in a scissor position and all bony prominences were well-padded. The patient had the lower extremity prepped and draped in the standard surgical fashion. The incision was made 4 finger breadths superior to the greater trochanter. A guide pin was inserted into the tip of the greater trochanter under fluoroscopic guidance. An opening reamer was used to gain access to the femoral canal.  We then placed a guide rod down the entire length of the femur.  We elected to use a long nail to protect the distal  femur given some subtrochanteric tension of the peritrochanteric fracture.  We reamed up to a size 11-1/2 reamer for a 10 diameter nail.  The nail length was measured and inserted down the femoral canal to its proper depth. The appropriate version of insertion for the lag screw was found under fluoroscopy. A pin was inserted up the femoral neck through the jig. The length of the lag screw was then measured. The lag screw was inserted as near to center-center in the head as possible. The leg was taken out of traction, then the compression screw was used to compress across the fracture. Compression was visualized on serial xrays.   We next turned our attention to the distal interlocking screw.  This was placed through the drill guide of the nail inserter.  A small incision was made overlying the lateral thigh at the screw site, and a tonsil was used to disect down to bone.  A drill pass was made through the jig and across the nail through both cortices.  This was measured, and the appropriate screw was placed under hand power and found to have good bite.    The wound was copiously irrigated with saline and the subcutaneous layer closed with 2.0 vicryl and the skin was reapproximated with staples. The wounds were cleaned and dried a final time and a sterile dressing was placed. The hip was taken through a range of motion at the end of the case under fluoroscopic imaging to visualize the approach-withdraw phenomenon and confirm implant length in the head. The patient was then awakened from anesthesia and taken  to the recovery room in stable condition. All counts were correct at the end of the case.   POSTOPERATIVE PLAN: The patient will be weight bearing as tolerated and will return in 2 weeks for staple removal and the patient will receive DVT prophylaxis based on other medications, activity level, and risk ratio of bleeding to thrombosis.  She will resume Eliquis starting tomorrow for her chronic paroxysmal  atrial fibrillation prophylaxis.  This will be under the management of our pharmacy colleagues.   Teresa Canard, MD [facility] [phone] 11:51 AM

## 2024-03-08 NOTE — Transfer of Care (Signed)
 Immediate Anesthesia Transfer of Care Note  Patient: Teresa Benitez  Procedure(s) Performed: FIXATION, FRACTURE, INTERTROCHANTERIC, WITH INTRAMEDULLARY ROD (Left)  Patient Location: PACU  Anesthesia Type:General  Level of Consciousness: awake, alert , and oriented  Airway & Oxygen Therapy: Patient Spontanous Breathing and Patient connected to face mask oxygen  Post-op Assessment: Report given to RN, Post -op Vital signs reviewed and stable, and Patient moving all extremities X 4  Post vital signs: Reviewed and stable  Last Vitals:  Vitals Value Taken Time  BP 148/74 03/08/24 11:56  Temp    Pulse 68 03/08/24 12:01  Resp 13 03/08/24 12:01  SpO2 99 % 03/08/24 12:01  Vitals shown include unfiled device data.  Last Pain:  Vitals:   03/08/24 0933  TempSrc: Oral  PainSc: 9       Patients Stated Pain Goal: 3 (03/08/24 0933)  Complications: No notable events documented.

## 2024-03-09 ENCOUNTER — Ambulatory Visit (INDEPENDENT_AMBULATORY_CARE_PROVIDER_SITE_OTHER): Admitting: Gastroenterology

## 2024-03-09 DIAGNOSIS — S72002A Fracture of unspecified part of neck of left femur, initial encounter for closed fracture: Secondary | ICD-10-CM | POA: Diagnosis not present

## 2024-03-09 DIAGNOSIS — W19XXXA Unspecified fall, initial encounter: Secondary | ICD-10-CM | POA: Diagnosis not present

## 2024-03-09 DIAGNOSIS — Y92009 Unspecified place in unspecified non-institutional (private) residence as the place of occurrence of the external cause: Secondary | ICD-10-CM | POA: Diagnosis not present

## 2024-03-09 DIAGNOSIS — Z79899 Other long term (current) drug therapy: Secondary | ICD-10-CM | POA: Diagnosis not present

## 2024-03-09 LAB — FERRITIN: Ferritin: 88 ng/mL (ref 11–307)

## 2024-03-09 LAB — RETICULOCYTES
Immature Retic Fract: 23 % — ABNORMAL HIGH (ref 2.3–15.9)
RBC.: 2.72 MIL/uL — ABNORMAL LOW (ref 3.87–5.11)
Retic Count, Absolute: 102.8 10*3/uL (ref 19.0–186.0)
Retic Ct Pct: 3.8 % — ABNORMAL HIGH (ref 0.4–3.1)

## 2024-03-09 LAB — IRON AND TIBC
Iron: 46 ug/dL (ref 28–170)
Saturation Ratios: 17 % (ref 10.4–31.8)
TIBC: 267 ug/dL (ref 250–450)
UIBC: 221 ug/dL

## 2024-03-09 LAB — RENAL FUNCTION PANEL
Albumin: 2.4 g/dL — ABNORMAL LOW (ref 3.5–5.0)
Anion gap: 7 (ref 5–15)
BUN: 17 mg/dL (ref 8–23)
CO2: 28 mmol/L (ref 22–32)
Calcium: 8.4 mg/dL — ABNORMAL LOW (ref 8.9–10.3)
Chloride: 100 mmol/L (ref 98–111)
Creatinine, Ser: 0.74 mg/dL (ref 0.44–1.00)
GFR, Estimated: >60 mL/min (ref >60)
Glucose, Bld: 174 mg/dL — ABNORMAL HIGH (ref 70–99)
Phosphorus: 2.6 mg/dL (ref 2.5–4.6)
Potassium: 4 mmol/L (ref 3.5–5.1)
Sodium: 135 mmol/L (ref 135–145)

## 2024-03-09 LAB — CBC
HCT: 26.7 % — ABNORMAL LOW (ref 36.0–46.0)
Hemoglobin: 8.8 g/dL — ABNORMAL LOW (ref 12.0–15.0)
MCH: 32.2 pg (ref 26.0–34.0)
MCHC: 33 g/dL (ref 30.0–36.0)
MCV: 97.8 fL (ref 80.0–100.0)
Platelets: 73 10*3/uL — ABNORMAL LOW (ref 150–400)
RBC: 2.73 MIL/uL — ABNORMAL LOW (ref 3.87–5.11)
RDW: 14.9 % (ref 11.5–15.5)
WBC: 10.4 10*3/uL (ref 4.0–10.5)
nRBC: 0 % (ref 0.0–0.2)

## 2024-03-09 LAB — GLUCOSE, CAPILLARY
Glucose-Capillary: 205 mg/dL — ABNORMAL HIGH (ref 70–99)
Glucose-Capillary: 171 mg/dL — ABNORMAL HIGH (ref 70–99)
Glucose-Capillary: 175 mg/dL — ABNORMAL HIGH (ref 70–99)
Glucose-Capillary: 147 mg/dL — ABNORMAL HIGH (ref 70–99)

## 2024-03-09 LAB — FOLATE: Folate: 12.1 ng/mL (ref >5.9)

## 2024-03-09 LAB — MAGNESIUM: Magnesium: 1.8 mg/dL (ref 1.7–2.4)

## 2024-03-09 LAB — VITAMIN B12: Vitamin B-12: 723 pg/mL (ref 180–914)

## 2024-03-09 MED ORDER — APIXABAN 5 MG PO TABS
5.0000 mg | ORAL_TABLET | Freq: Two times a day (BID) | ORAL | Status: DC
  Administered 2024-03-09 – 2024-03-10 (×2): 5 mg via ORAL
  Filled 2024-03-09 (×2): qty 1

## 2024-03-09 MED ORDER — SENNOSIDES-DOCUSATE SODIUM 8.6-50 MG PO TABS
1.0000 | ORAL_TABLET | Freq: Every day | ORAL | Status: DC
  Administered 2024-03-09 – 2024-03-10 (×2): 1 via ORAL
  Filled 2024-03-09 (×2): qty 1

## 2024-03-09 MED ORDER — OXYCODONE HCL 10 MG PO TABS: 10.0000 mg | ORAL_TABLET | Freq: Four times a day (QID) | ORAL | 0 refills | Status: AC | PRN

## 2024-03-09 MED ORDER — INSULIN GLARGINE-YFGN 100 UNIT/ML ~~LOC~~ SOLN
10.0000 [IU] | Freq: Every day | SUBCUTANEOUS | Status: DC
  Administered 2024-03-09 – 2024-03-10 (×2): 10 [IU] via SUBCUTANEOUS
  Filled 2024-03-09 (×2): qty 0.1

## 2024-03-09 MED ORDER — SENNOSIDES-DOCUSATE SODIUM 8.6-50 MG PO TABS: 2.0000 | ORAL_TABLET | Freq: Two times a day (BID) | ORAL | Status: DC | PRN

## 2024-03-09 NOTE — Progress Notes (Signed)
 PROGRESS NOTE  Teresa Benitez ZOX:[id number] DOB: 13-Oct-1955   PCP: Avelina Bode, DO  Patient is from: Home.  Uses cane and a rolling walker at baseline.  DOA: 03/07/2024 LOS: 2  Chief complaints No chief complaint on file.    Brief Narrative / Interim history: 68 year old F with PMH of A-fib on Eliquis, liver cirrhosis, COPD, DM-2, HTN, hypothyroidism, chronic pain/fibromyalgia and tobacco use disorder brought to ED due to left hip pain after she had a fall at home, and admitted with comminuted and mildly displaced intertrochanteric left hip fracture.  Orthopedic surgery consulted.  Patient is on multiple sedating medications with high risk for polypharmacy.  Patient underwent intramedullary implant by Dr. Hiram Lukes on 6/15.   Subjective: Seen and examined earlier this morning.  No major events overnight of this morning.  Complains nausea, dizziness and pain at surgical site.  Denies chest pain, shortness of breath, abdominal pain or UTI symptoms.  Denies diarrhea.  Working with therapy.  Objective: Vitals:   03/09/24 0439 03/09/24 0453 03/09/24 0753 03/09/24 0849  BP: 134/61  122/62   Pulse: 74  80   Resp: 18  17   Temp: 98.4 F (36.9 C)  99.1 F (37.3 C)   TempSrc: Oral  Oral   SpO2: 92%  (!) 87% 90%  Weight:  96.7 kg    Height:        Examination:  GENERAL: No apparent distress.  Nontoxic. HEENT: MMM.  Vision and hearing grossly intact.  NECK: Supple.  No apparent JVD.  RESP:  No IWOB.  Fair aeration bilaterally. CVS:  RRR. Heart sounds normal.  ABD/GI/GU: BS+. Abd soft, NTND.  MSK/EXT:  Moves extremities. No apparent deformity. No edema.  SKIN: Dressing over left thigh DCI. NEURO: AA.  Oriented appropriately.  No apparent focal neuro deficit. PSYCH: Calm. Normal affect.   Consultants:  Orthopedic surgery  Procedures: 6/15-intramedullary implant for intertrochanteric, peritrochanteric and subtrochanteric fracture  Microbiology summarized: None  Assessment  and plan: Accidental fall at home-likely due to polypharmacy.  On multiple sedating medications. Left hip fracture due to accidental fall at home -S/p intramedullary implant by Dr. Hiram Lukes on 6/15 -Vitamin D level normal at 40.3. -Pain control per orthopedic surgery -Resume Eliquis when okay from surgical standpoint.  Hgb dropped about 4 g -Bowel regimen -Narcan as needed -PT/OT   Paroxysmal A-fib: On Toprol -XL and Eliquis at home. -Continue Toprol -XL -Resume Eliquis when okay from surgical standpoint -Optimize electrolytes  NIDDM-2 with hyperglycemia: A1c 6.4%.  On metformin at home. Recent Labs  Lab 03/08/24 1303 03/08/24 1825 03/08/24 2137 03/09/24 0749 03/09/24 1159  GLUCAP 168* 208* 175* 205* 171*  -Continue SSI-moderate -Add Semglee 10 units daily -Continue home metformin  Postoperative blood loss anemia: Reported EBL 50 cc although Hgb dropped about 4 g.  No overt bleeding elsewhere. Recent Labs    08/08/23 1341 03/07/24 0124 03/08/24 1348 03/09/24 0951  HGB 13.2 13.0 10.7* 8.8*  - Monitor H&H - Check anemia panel  Chronic COPD: Stable -Continue Breztri for home Trelegy -Continue Singulair  and as needed albuterol   Chronic pain syndrome/fibromyalgia: On Robaxin , Butrans, maximum dose of gabapentin  at home. -Continue gabapentin  to 600 mg 3 times daily.  She was on 1200 mg 3 times daily at home. -Continue home meds cautiously   Thrombocytopenia: Stable -Check anemia panel   Hypothyroidism -Continue Synthroid    Dyslipidemia -Continue Lipitor   Tobacco abuse: Reports smoking about half a pack a day -Encouraged smoking cessation -Continue nicotine patch  Thrombocytopenia: Mild Recent Labs  Lab 03/07/24 0124 03/08/24 1348 03/09/24 0951  PLT 106* 68* 73*  - Monitor  Polypharmacy: Butrans, Remeron , gabapentin , Bentyl , Robaxin , oxycodone ... Discussed risk of polypharmacy with patient.  She is already having nausea and dizziness.  At this time, she is  willing to cut down on gabapentin  to 600 mg 3 times daily - Encourage patient to discuss with prescribing providers.  Class I obesity Body mass index is 30.59 kg/m.         DVT prophylaxis:  SCDs Start: 03/08/24 1247 SCDs Start: 03/07/24 0258  Code Status: Full code Family Communication: None at bedside. Level of care: Med-Surg Status is: Inpatient Remains inpatient appropriate because: Hip fracture   Final disposition: To be determined   55 minutes with more than 50% spent in reviewing records, counseling patient/family and coordinating care.   Sch Meds:  Scheduled Meds:  amLODipine   5 mg Oral Daily   atorvastatin   10 mg Oral Daily   budesonide-glycopyrrolate-formoterol  2 puff Inhalation BID   buprenorphine   2 patch Transdermal Q Sat   cholecalciferol  400 Units Oral Daily   cyanocobalamin  1,000 mcg Oral Daily   famotidine   20 mg Oral BID   feeding supplement  237 mL Oral TID BM   gabapentin   600 mg Oral TID   insulin aspart  0-15 Units Subcutaneous TID WC   insulin aspart  0-5 Units Subcutaneous QHS   levothyroxine   137 mcg Oral Q0600   lisinopril   40 mg Oral Daily   metFORMIN  500 mg Oral Q breakfast   methocarbamol   750 mg Oral TID   metoprolol  succinate  50 mg Oral Daily   mirtazapine   30 mg Oral QHS   montelukast   10 mg Oral QHS   mupirocin ointment  1 Application Nasal BID   nicotine  14 mg Transdermal Daily   pantoprazole   40 mg Oral QAC breakfast   rOPINIRole  0.5 mg Oral TID   senna-docusate  1 tablet Oral Daily   sertraline   100 mg Oral Daily   sodium chloride  flush  3 mL Intravenous Q12H   Continuous Infusions:   PRN Meds:.acetaminophen **OR** acetaminophen, albuterol , HYDROmorphone (DILAUDID) injection, ibuprofen , melatonin, naLOXone (NARCAN)  injection, ondansetron  (ZOFRAN ) IV, oxyCODONE  **OR** oxyCODONE , senna-docusate  Antimicrobials: Anti-infectives (From admission, onward)    Start     Dose/Rate Route Frequency Ordered Stop    03/08/24 1600  ceFAZolin (ANCEF) IVPB 2g/100 mL premix        2 g 200 mL/hr over 30 Minutes Intravenous Every 6 hours 03/08/24 1246 03/08/24 2205   03/08/24 1015  ceFAZolin (ANCEF) IVPB 2g/100 mL premix        2 g 200 mL/hr over 30 Minutes Intravenous On call to O.R. 03/08/24 1013 03/08/24 1111        I have personally reviewed the following labs and images: CBC: Recent Labs  Lab 03/07/24 0124 03/08/24 1348 03/09/24 0951  WBC 15.8* 10.2 10.4  NEUTROABS 13.4*  --   --   HGB 13.0 10.7* 8.8*  HCT 39.1 31.8* 26.7*  MCV 97.0 96.7 97.8  PLT 106* 68* 73*   BMP &GFR Recent Labs  Lab 03/07/24 0124 03/07/24 0333 03/08/24 1348 03/09/24 0547 03/09/24 0951  NA 136  --  134*  --  135  K 4.0  --  3.9  --  4.0  CL 101  --  99  --  100  CO2 27  --  27  --  28  GLUCOSE 309*  --  177*  --  174*  BUN 21  --  20  --  17  CREATININE 0.77  --  0.84  --  0.74  CALCIUM 9.5  --  9.1  --  8.4*  MG  --  1.8  --  1.8  --   PHOS  --   --   --   --  2.6   Estimated Creatinine Clearance: 87.1 mL/min (by C-G formula based on SCr of 0.74 mg/dL). Liver & Pancreas: Recent Labs  Lab 03/07/24 0124 03/08/24 1348 03/09/24 0951  AST 35 27  --   ALT 18 19  --   ALKPHOS 97 64  --   BILITOT 0.9 0.7  --   PROT 5.9* 5.6*  --   ALBUMIN 3.0* 2.8* 2.4*   No results for input(s): LIPASE, AMYLASE in the last 168 hours. No results for input(s): AMMONIA in the last 168 hours. Diabetic: Recent Labs    03/08/24 1348  HGBA1C 6.4*   Recent Labs  Lab 03/08/24 1303 03/08/24 1825 03/08/24 2137 03/09/24 0749 03/09/24 1159  GLUCAP 168* 208* 175* 205* 171*   Cardiac Enzymes: No results for input(s): CKTOTAL, CKMB, CKMBINDEX, TROPONINI in the last 168 hours. No results for input(s): PROBNP in the last 8760 hours. Coagulation Profile: Recent Labs  Lab 03/07/24 0333  INR 1.2   Thyroid Function Tests: No results for input(s): TSH, T4TOTAL, FREET4, T3FREE, THYROIDAB in the  last 72 hours. Lipid Profile: No results for input(s): CHOL, HDL, LDLCALC, TRIG, CHOLHDL, LDLDIRECT in the last 72 hours. Anemia Panel: No results for input(s): VITAMINB12, FOLATE, FERRITIN, TIBC, IRON, RETICCTPCT in the last 72 hours. Urine analysis:    Component Value Date/Time   COLORURINE DARK YELLOW 07/11/2017 1635   APPEARANCEUR CLOUDY (A) 07/11/2017 1635   LABSPEC 1.014 07/11/2017 1635   PHURINE 8.0 07/11/2017 1635   GLUCOSEU NEGATIVE 07/11/2017 1635   GLUCOSEU NEGATIVE 02/04/2007 1521   HGBUR NEGATIVE 07/11/2017 1635   BILIRUBINUR NEGATIVE 10/21/2013 0637   KETONESUR NEGATIVE 07/11/2017 1635   PROTEINUR NEGATIVE 07/11/2017 1635   UROBILINOGEN 0.2 10/21/2013 0637   NITRITE POSITIVE (A) 07/11/2017 1635   LEUKOCYTESUR TRACE (A) 07/11/2017 1635   Sepsis Labs: Invalid input(s): PROCALCITONIN, LACTICIDVEN  Microbiology: Recent Results (from the past 240 hours)  Surgical PCR screen     Status: None   Collection Time: 03/07/24  8:53 AM   Specimen: Nasal Mucosa; Nasal Swab  Result Value Ref Range Status   MRSA, PCR NEGATIVE NEGATIVE Final   Staphylococcus aureus NEGATIVE NEGATIVE Final    Comment: (NOTE) The Xpert SA Assay (FDA approved for NASAL specimens in patients 60 years of age and older), is one component of a comprehensive surveillance program. It is not intended to diagnose infection nor to guide or monitor treatment. Performed at [facility], [address], [place], [place] [address]     Radiology Studies: No results found.     Compton Brigance T. Broady Lafoy [facility]  If 7PM-7AM, please contact night-coverage [email or web address] 03/09/2024, 12:26 PM

## 2024-03-09 NOTE — TOC CAGE-AID Note (Signed)
 Transition of Care [facility]) - CAGE-AID Screening   Patient Details  Name: Teresa Benitez MRN: [id number] Date of Birth: March 06, 1956  Transition of Care [facility]) CM/SW Contact:    Vilda Zollner E Rashae Rother, LCSW Phone Number: 03/09/2024, 10:02 AM   Clinical Narrative:    CAGE-AID Screening:    Have You Ever Felt You Ought to Cut Down on Your Drinking or Drug Use?: No Have People Annoyed You By [employer] Your Drinking Or Drug Use?: No Have You Felt Bad Or Guilty About Your Drinking Or Drug Use?: No Have You Ever Had a Drink or Used Drugs First Thing In The Morning to Steady Your Nerves or to Get Rid of a Hangover?: No CAGE-AID Score: 0  Substance Abuse Education Offered: No

## 2024-03-09 NOTE — Progress Notes (Signed)
   Subjective:  Teresa Benitez is a 68 y.o. female, 1 Day Post-Op    s/p Procedure(s): FIXATION, FRACTURE, INTERTROCHANTERIC, WITH INTRAMEDULLARY ROD   Patient reports pain as moderate, passing gas. Denies n/t. Fever or chills. No calf pain.    Objective:   VITALS:   Vitals:   03/09/24 0439 03/09/24 0453 03/09/24 0753 03/09/24 0849  BP: 134/61  122/62   Pulse: 74  80   Resp: 18  17   Temp: 98.4 F (36.9 C)  99.1 F (37.3 C)   TempSrc: Oral  Oral   SpO2: 92%  (!) 87% 90%  Weight:  96.7 kg    Height:       NAD  LLE:   Neurovascular intact Sensation intact distally Intact pulses distally Dorsiflexion/Plantar flexion intact Incision: dressing C/D/I No cellulitis present Compartment soft Calves soft b/l , nontender.  Ioban dressings intact. No drainage.   Lab Results  Component Value Date   WBC 10.4 03/09/2024   HGB 8.8 (L) 03/09/2024   HCT 26.7 (L) 03/09/2024   MCV 97.8 03/09/2024   PLT 73 (L) 03/09/2024   BMET    Component Value Date/Time   NA 135 03/09/2024 0951   NA 146 (H) 04/29/2019 1200   K 4.0 03/09/2024 0951   CL 100 03/09/2024 0951   CO2 28 03/09/2024 0951   GLUCOSE 174 (H) 03/09/2024 0951   BUN 17 03/09/2024 0951   BUN 9 04/29/2019 1200   CREATININE 0.74 03/09/2024 0951   CREATININE 0.61 08/08/2023 1341   CALCIUM 8.4 (L) 03/09/2024 0951   GFRNONAA >60 03/09/2024 0951     Assessment/Plan: 1 Day Post-Op   Principal Problem:   Closed left hip fracture (HCC)   Advance diet Up with therapy  Dispo: d/c to SNF , cleared from orthopedic standpoint. Pain meds printed, will continue home eliquis.    Weightbearing Status: WBAT LLE:  DVT Prophylaxis: Eliquis, continue outpatient eliquis   Dressings can be changed with mepilex if saturated PRN. Ioban dressing ok to shower in, leave on until follow up if stays dry.  Follow up in the office in 2 weeks for staple removal and wound check.   Ortho will sign off, message for any questions or  concerns.   Alejandria Wessells D Nathali Vent 03/09/2024, 2:54 PM  Karyl Paget PA-C  Physician Assistant with Dr. Link Rice [facility]

## 2024-03-09 NOTE — NC FL2 (Signed)
 [place]  MEDICAID FL2 LEVEL OF CARE FORM     IDENTIFICATION  Patient Name: Teresa Benitez Birthdate: [birth year] Sex: female Admission Date (Current Location): 03/07/2024  [facility] and [place] Number:  Producer, television/film/video and Address:  [facility] [place], [address], [place], [place] [address]      Provider Number: [id number]  Attending Physician Name and Address:  Theadore Finger, MD  Relative Name and Phone Number:       Current Level of Care: Hospital Recommended Level of Care: Skilled Nursing Facility Prior Approval Number:    Date Approved/Denied:   PASRR Number: [id number]  Discharge Plan: SNF    Current Diagnoses: Patient Active Problem List   Diagnosis Date Noted   Closed left hip fracture (HCC) 03/07/2024   Ventral hernia 08/08/2023   Chronic prescription opiate use 01/08/2022   Loss of weight    COPD (chronic obstructive pulmonary disease) (HCC) 04/25/2020   Pulmonary nodule 04/25/2020   Tobacco use 04/25/2020   IBS (irritable colon syndrome) 09/15/2019   Abdominal pain, chronic, left upper quadrant 09/15/2019   Back pain 04/29/2019   Peripheral neuropathy 08/26/2018   Carpal tunnel syndrome, bilateral 08/26/2018   Nausea without vomiting 06/17/2017   Abdominal pain 06/17/2017   Chest pain 02/13/2015   Chest pain, rule out acute myocardial infarction 02/12/2015   Constipation 08/03/2013   Abdominal pain, left lower quadrant 08/03/2013   Cirrhosis of liver without ascites (HCC) 08/03/2013   Hyperlipidemia 04/25/2009   Overweight 04/25/2009   CAD, NATIVE VESSEL 04/25/2009   DEPRESSION 01/21/2008   Essential hypertension 01/21/2008   COLONIC POLYPS, HYPERPLASTIC 02/28/2007    Orientation RESPIRATION BLADDER Height & Weight     Self, Situation, Time, Place  Normal Continent Weight: 213 lb 3 oz (96.7 kg) Height:  5' 10 (177.8 cm)  BEHAVIORAL SYMPTOMS/MOOD NEUROLOGICAL BOWEL NUTRITION STATUS      Continent Diet (see d/c  summary)  AMBULATORY STATUS COMMUNICATION OF NEEDS Skin   Extensive Assist Verbally Surgical wounds (Incision hip)                       Personal Care Assistance Level of Assistance  Bathing, Feeding, Dressing Bathing Assistance: Maximum assistance Feeding assistance: Independent Dressing Assistance: Limited assistance     Functional Limitations Info  Sight, Hearing, Speech Sight Info: Adequate Hearing Info: Adequate Speech Info: Adequate    SPECIAL CARE FACTORS FREQUENCY  PT (By licensed PT), OT (By licensed OT)     PT Frequency: 5x/week OT Frequency: 5x/week            Contractures Contractures Info: Not present    Additional Factors Info  Code Status, Allergies Code Status Info: Full code Allergies Info: Ampicillin, ciprofloxacin, Sulfamethoxazole, Carbamazepine , Dilantin  (phenytoin  Sodium Extended), Anastrozole           Current Medications (03/09/2024):  This is the current hospital active medication list Current Facility-Administered Medications  Medication Dose Route Frequency Provider Last Rate Last Admin   acetaminophen (TYLENOL) tablet 650 mg  650 mg Oral Q6H PRN Janeth Medicus, MD       Or   acetaminophen (TYLENOL) suppository 650 mg  650 mg Rectal Q6H PRN Rogers, Jason Patrick, MD       albuterol  (PROVENTIL ) (2.5 MG/3ML) 0.083% nebulizer solution 2.5 mg  2.5 mg Nebulization Q6H PRN Janeth Medicus, MD       amLODipine  (NORVASC ) tablet 5 mg  5 mg Oral Daily Janeth Medicus, MD  5 mg at 03/09/24 0934   atorvastatin  (LIPITOR) tablet 10 mg  10 mg Oral Daily Janeth Medicus, MD   10 mg at 03/09/24 0935   budesonide-glycopyrrolate-formoterol (BREZTRI) 160-9-4.8 MCG/ACT inhaler 2 puff  2 puff Inhalation BID Janeth Medicus, MD   2 puff at 03/09/24 0849   buprenorphine  (BUTRANS) 10 MCG/HR 2 patch  2 patch Transdermal Q Sat Janeth Medicus, MD   2 patch at 03/07/24 1119   cholecalciferol (VITAMIN D3) 10 MCG (400 UNIT)  tablet 400 Units  400 Units Oral Daily Janeth Medicus, MD   400 Units at 03/09/24 1238   cyanocobalamin (VITAMIN B12) tablet 1,000 mcg  1,000 mcg Oral Daily Janeth Medicus, MD   1,000 mcg at 03/09/24 1610   famotidine  (PEPCID ) tablet 20 mg  20 mg Oral BID Janeth Medicus, MD   20 mg at 03/09/24 0935   feeding supplement (ENSURE PLUS HIGH PROTEIN) liquid 237 mL  237 mL Oral TID BM Janeth Medicus, MD   237 mL at 03/08/24 2000   gabapentin  (NEURONTIN ) capsule 600 mg  600 mg Oral TID Gonfa, Taye T, MD   600 mg at 03/09/24 0944   HYDROmorphone (DILAUDID) injection 0.5 mg  0.5 mg Intravenous Q2H PRN Janeth Medicus, MD   0.5 mg at 03/09/24 9604   ibuprofen  (ADVIL ) tablet 400 mg  400 mg Oral Q6H PRN Janeth Medicus, MD       insulin aspart (novoLOG) injection 0-15 Units  0-15 Units Subcutaneous TID WC Gonfa, Taye T, MD   3 Units at 03/09/24 1236   insulin aspart (novoLOG) injection 0-5 Units  0-5 Units Subcutaneous QHS Gonfa, Taye T, MD       insulin glargine-yfgn (SEMGLEE) injection 10 Units  10 Units Subcutaneous Daily Gonfa, Taye T, MD       levothyroxine  (SYNTHROID ) tablet 137 mcg  137 mcg Oral Q0600 Janeth Medicus, MD   137 mcg at 03/09/24 0542   lisinopril  (ZESTRIL ) tablet 40 mg  40 mg Oral Daily Janeth Medicus, MD   40 mg at 03/09/24 0935   melatonin tablet 3 mg  3 mg Oral QHS PRN Janeth Medicus, MD   3 mg at 03/08/24 2029   metFORMIN (GLUCOPHAGE-XR) 24 hr tablet 500 mg  500 mg Oral Q breakfast Janeth Medicus, MD   500 mg at 03/09/24 0800   methocarbamol  (ROBAXIN ) tablet 750 mg  750 mg Oral TID Janeth Medicus, MD   750 mg at 03/09/24 0935   metoprolol  succinate (TOPROL -XL) 24 hr tablet 50 mg  50 mg Oral Daily Janeth Medicus, MD   50 mg at 03/09/24 0935   mirtazapine  (REMERON ) tablet 30 mg  30 mg Oral QHS Janeth Medicus, MD   30 mg at 03/08/24 2030   montelukast  (SINGULAIR ) tablet 10 mg  10 mg Oral QHS Janeth Medicus, MD   10 mg at 03/08/24 2029   mupirocin ointment (BACTROBAN) 2 % 1 Application  1 Application Nasal BID Janeth Medicus, MD   1 Application at 03/09/24 0936   naloxone (NARCAN) injection 0.4 mg  0.4 mg Intravenous PRN Janeth Medicus, MD       nicotine (NICODERM CQ - dosed in mg/24 hours) patch 14 mg  14 mg Transdermal Daily Janeth Medicus, MD   14 mg at 03/09/24 5409   ondansetron  (ZOFRAN ) injection 4 mg  4 mg Intravenous Q6H PRN Janeth Medicus, MD  oxyCODONE  (Oxy IR/ROXICODONE ) immediate release tablet 10 mg  10 mg Oral Q6H PRN Janeth Medicus, MD       Or   oxyCODONE  (Oxy IR/ROXICODONE ) immediate release tablet 15 mg  15 mg Oral Q6H PRN Janeth Medicus, MD   15 mg at 03/09/24 0439   pantoprazole  (PROTONIX ) EC tablet 40 mg  40 mg Oral QAC breakfast Janeth Medicus, MD   40 mg at 03/09/24 0759   rOPINIRole (REQUIP) tablet 0.5 mg  0.5 mg Oral TID Janeth Medicus, MD   0.5 mg at 03/09/24 0935   senna-docusate (Senokot-S) tablet 1 tablet  1 tablet Oral Daily Gonfa, Taye T, MD   1 tablet at 03/09/24 0935   senna-docusate (Senokot-S) tablet 2 tablet  2 tablet Oral BID PRN Gonfa, Taye T, MD       sertraline  (ZOLOFT ) tablet 100 mg  100 mg Oral Daily Janeth Medicus, MD   100 mg at 03/09/24 0935   sodium chloride  flush (NS) 0.9 % injection 3 mL  3 mL Intravenous Q12H Janeth Medicus, MD   3 mL at 03/09/24 4401     Discharge Medications: Please see discharge summary for a list of discharge medications.  Relevant Imaging Results:  Relevant Lab Results:   Additional Information SSN [id number] [employer] [place], [place]

## 2024-03-09 NOTE — Evaluation (Signed)
 Physical Therapy Evaluation Patient Details Name: Teresa Benitez MRN: [id number] DOB: September 29, 1955 Today's Date: 03/09/2024  History of Present Illness  Pt is a 68 y.o. F presenting to [facility] ED on 03/07/24 following a fall w/ reports of left-sided leg pain. IMG showed left hip fx. Pt is s/p intertrochanteric fixation w/ intramedullary rod on 03/08/24. PMH is significant for A-fib, dyslipidemia, HTN, cirrhosis, COPD, tobacco abuse, DM, chronic pain (Fibromyalgia), and hypothyroidism.  Clinical Impression  Prior to admittance pt was mobilizing independently with occasional use of SPC when experiencing pain in R knee. Pt presents to evaluation with deficits in mobility, strength, balance, power, activity tolerance, and pain, all limiting pt's ability to mobilize near baseline. Pt was able to complete sit to stand w/ AD and minimal to moderate physical assistance. Progression of mobility was limited secondary to pain. Pt would benefit from further bed mobility, transfer, and gait training. Pt needs to be mod I with all mobility in order to safely return home as pt reports lack of frequent caregiver support due to daughter having MS. PT will continue to treat pt while she is admitted. Patient will benefit from continued inpatient follow up therapy, <3 hours/day       If plan is discharge home, recommend the following: A lot of help with walking and/or transfers;A lot of help with bathing/dressing/bathroom;Assistance with cooking/housework;Assist for transportation;Help with stairs or ramp for entrance   Can travel by private vehicle   No    Equipment Recommendations BSC/3in1;Rolling walker (2 wheels)  Recommendations for Other Services  OT consult    Functional Status Assessment Patient has had a recent decline in their functional status and demonstrates the ability to make significant improvements in function in a reasonable and predictable amount of time.     Precautions / Restrictions  Precautions Precautions: Fall Recall of Precautions/Restrictions: Intact Restrictions Weight Bearing Restrictions Per Provider Order: Yes LLE Weight Bearing Per Provider Order: Weight bearing as tolerated      Mobility  Bed Mobility Overal bed mobility: Needs Assistance Bed Mobility: Supine to Sit, Sit to Supine     Supine to sit: Min assist, HOB elevated, Used rails Sit to supine: Min assist, HOB elevated, Used rails   General bed mobility comments: minA for LLE management; VC given for sequencing, increased time to complete.    Transfers Overall transfer level: Needs assistance Equipment used: Rolling walker (2 wheels) Transfers: Sit to/from Stand, Bed to chair/wheelchair/BSC Sit to Stand: Mod assist, From elevated surface   Step pivot transfers: Contact guard assist (attempted stand step transfer to bedside chair w/ RW and minA; unable to complete due to pt feeling nauseous. Pt displaying difficulty with weigtht shifting to the L and mobilizing LLE.)       General transfer comment: STS from EOB w/ RW and mod A for powerup. VC given for sequencing, upper and lower extremity management; increased time to complete. Pt uses RUE to push from surface to facilitate increased weight shift to RLE.    Ambulation/Gait                  Stairs            Wheelchair Mobility     Tilt Bed    Modified Rankin (Stroke Patients Only)       Balance Overall balance assessment: Needs assistance Sitting-balance support: Feet supported, Single extremity supported Sitting balance-Leahy Scale: Fair     Standing balance support: Bilateral upper extremity supported, During functional activity, Reliant on assistive  device for balance Standing balance-Leahy Scale: Poor Standing balance comment: reliant on external support                             Pertinent Vitals/Pain Pain Assessment Pain Assessment: 0-10 Pain Score: 9  Pain Location: L hip Pain  Descriptors / Indicators: Aching, Burning, Constant, Crying, Discomfort, Grimacing, Guarding, Moaning Pain Intervention(s): Limited activity within patient's tolerance, Monitored during session, Patient requesting pain meds-RN notified    Home Living Family/patient expects to be discharged to:: Private residence Living Arrangements: Children Available Help at Discharge: Family;Available PRN/intermittently (daughter has MS) Type of Home: House Home Access: Level entry (pt lives in basement of daughter's house)       Home Layout: Two level;Able to live on main level with bedroom/bathroom Home Equipment: Jeananne Mighty - single point      Prior Function Prior Level of Function : Independent/Modified Independent;Driving;History of Falls (last six months)             Mobility Comments: Pt uses a SPC for mobilization when R knee is in pain, but is independent with mobility apart from these instances. ADLs Comments: independent     Extremity/Trunk Assessment   Upper Extremity Assessment Upper Extremity Assessment: Overall WFL for tasks assessed    Lower Extremity Assessment Lower Extremity Assessment: LLE deficits/detail LLE Deficits / Details: reduced LLE strength as per functional assessment LLE: Unable to fully assess due to pain LLE Sensation: WNL LLE Coordination: decreased gross motor    Cervical / Trunk Assessment Cervical / Trunk Assessment: Kyphotic  Communication   Communication Communication: No apparent difficulties    Cognition Arousal: Alert Behavior During Therapy: WFL for tasks assessed/performed   PT - Cognitive impairments: No apparent impairments                         Following commands: Intact       Cueing Cueing Techniques: Verbal cues, Visual cues, Gestural cues     General Comments General comments (skin integrity, edema, etc.): pt reports mild dizziness following prolonged standing and stand-step transfer attempt; improves with prolonged  rest    Exercises     Assessment/Plan    PT Assessment Patient needs continued PT services  PT Problem List Decreased strength;Decreased range of motion;Decreased activity tolerance;Decreased balance;Decreased mobility;Decreased coordination;Decreased knowledge of use of DME;Pain       PT Treatment Interventions DME instruction;Gait training;Stair training;Functional mobility training;Therapeutic activities;Therapeutic exercise;Balance training;Neuromuscular re-education;Patient/family education;Wheelchair mobility training;Modalities;Manual techniques    PT Goals (Current goals can be found in the Care Plan section)  Acute Rehab PT Goals Patient Stated Goal: to go home PT Goal Formulation: With patient Time For Goal Achievement: 03/23/24 Potential to Achieve Goals: Good    Frequency Min 3X/week     Co-evaluation               AM-PAC PT 6 Clicks Mobility  Outcome Measure Help needed turning from your back to your side while in a flat bed without using bedrails?: A Little Help needed moving from lying on your back to sitting on the side of a flat bed without using bedrails?: A Little Help needed moving to and from a bed to a chair (including a wheelchair)?: A Little Help needed standing up from a chair using your arms (e.g., wheelchair or bedside chair)?: A Little Help needed to walk in hospital room?: A Lot Help needed climbing 3-5 steps with a  railing? : Total 6 Click Score: 15    End of Session Equipment Utilized During Treatment: Gait belt Activity Tolerance: Patient limited by pain Patient left: in bed;with call bell/phone within reach;with bed alarm set Nurse Communication: Mobility status;Patient requests pain meds PT Visit Diagnosis: Unsteadiness on feet (R26.81);Muscle weakness (generalized) (M62.81);History of falling (Z91.81);Pain Pain - Right/Left: Left Pain - part of body: Hip    Time: 1610-9604 PT Time Calculation (min) (ACUTE ONLY): 40  min   Charges:   PT Evaluation $PT Eval Low Complexity: 1 Low   PT General Charges $$ ACUTE PT VISIT: 1 Visit         Lonell Rives, SPT Acute Rehab [phone]   Lonell Rives 03/09/2024, 10:37 AM

## 2024-03-09 NOTE — TOC Initial Note (Signed)
 Transition of Care [facility]) - Initial/Assessment Note    Patient Details  Name: Teresa Benitez MRN: [id number] Date of Birth: 1956-03-30  Transition of Care [facility]) CM/SW Contact:    Katrinka Parr, LCSW Phone Number: 03/09/2024, 1:53 PM  Clinical Narrative:                  CSW met with pt to discuss PT recommendation for SNF. Pt is agreeable to SNF w/u. CSW explained medicare coverage and insurance auth process. Fl2 completed and bed requests sent in hub. TOC will follow up with SNF bed offers.   Expected Discharge Plan: Skilled Nursing Facility Barriers to Discharge: Continued Medical Work up      Expected Discharge Plan and Services       Living arrangements for the past 2 months: Single Family Home                                      Prior Living Arrangements/Services Living arrangements for the past 2 months: Single Family Home Lives with:: Adult Children Patient language and need for interpreter reviewed:: Yes        Need for Family Participation in Patient Care: No (Comment) Care giver support system in place?: Yes (comment)   Criminal Activity/Legal Involvement Pertinent to Current Situation/Hospitalization: No - Comment as needed  Activities of Daily Living   ADL Screening (condition at time of admission) Independently performs ADLs?: Yes (appropriate for developmental age) Is the patient deaf or have difficulty hearing?: No Does the patient have difficulty seeing, even when wearing glasses/contacts?: No Does the patient have difficulty concentrating, remembering, or making decisions?: No  Permission Sought/Granted                  Emotional Assessment Appearance:: Appears stated age Attitude/Demeanor/Rapport: Engaged Affect (typically observed): Appropriate Orientation: : Oriented to Self, Oriented to Place, Oriented to  Time, Oriented to Situation Alcohol / Substance Use: Not Applicable Psych Involvement: No (comment)  Admission  diagnosis:  Closed left hip fracture (HCC) [S72.002A] Patient Active Problem List   Diagnosis Date Noted   Closed left hip fracture (HCC) 03/07/2024   Ventral hernia 08/08/2023   Chronic prescription opiate use 01/08/2022   Loss of weight    COPD (chronic obstructive pulmonary disease) (HCC) 04/25/2020   Pulmonary nodule 04/25/2020   Tobacco use 04/25/2020   IBS (irritable colon syndrome) 09/15/2019   Abdominal pain, chronic, left upper quadrant 09/15/2019   Back pain 04/29/2019   Peripheral neuropathy 08/26/2018   Carpal tunnel syndrome, bilateral 08/26/2018   Nausea without vomiting 06/17/2017   Abdominal pain 06/17/2017   Chest pain 02/13/2015   Chest pain, rule out acute myocardial infarction 02/12/2015   Constipation 08/03/2013   Abdominal pain, left lower quadrant 08/03/2013   Cirrhosis of liver without ascites (HCC) 08/03/2013   Hyperlipidemia 04/25/2009   Overweight 04/25/2009   CAD, NATIVE VESSEL 04/25/2009   DEPRESSION 01/21/2008   Essential hypertension 01/21/2008   COLONIC POLYPS, HYPERPLASTIC 02/28/2007   PCP:  Avelina Bode, DO Pharmacy:   [facility] [phone] - [place],  - [address] [place] [address] [place] [address] Phone: [phone] Fax: [phone]  [facility] [address], [place] - [address] Consuela Denier [address] [place] [address] Phone: [phone] Fax: [phone]     Social Drivers of Health (SDOH) Social History: SDOH Screenings  Food Insecurity: No Food Insecurity (03/07/2024)  Housing: Unknown (03/07/2024)  Transportation Needs: No Transportation Needs (03/07/2024)  Utilities: Not At Risk (03/07/2024)  Financial Resource Strain: Low Risk  (05/23/2023)   Received from [facility]  Physical Activity: Unknown (05/23/2023)   Received from [facility]  Recent Concern: Physical Activity - Inactive (05/23/2023)   Received from [facility]  Social Connections: Socially Isolated (03/07/2024)   Stress: Stress Concern Present (09/12/2023)   Received from [facility]  Tobacco Use: High Risk (03/08/2024)  Health Literacy: Low Risk  (09/12/2023)   Received from [facility]   SDOH Interventions: Social Connections Interventions: Patient Declined   Readmission Risk Interventions     No data to display

## 2024-03-10 ENCOUNTER — Encounter (HOSPITAL_COMMUNITY): Payer: Self-pay | Admitting: Orthopedic Surgery

## 2024-03-10 DIAGNOSIS — Y92009 Unspecified place in unspecified non-institutional (private) residence as the place of occurrence of the external cause: Secondary | ICD-10-CM

## 2024-03-10 DIAGNOSIS — Z9189 Other specified personal risk factors, not elsewhere classified: Secondary | ICD-10-CM

## 2024-03-10 DIAGNOSIS — S72002A Fracture of unspecified part of neck of left femur, initial encounter for closed fracture: Secondary | ICD-10-CM | POA: Diagnosis not present

## 2024-03-10 LAB — RENAL FUNCTION PANEL
Albumin: 2.4 g/dL — ABNORMAL LOW (ref 3.5–5.0)
Anion gap: 7 (ref 5–15)
BUN: 14 mg/dL (ref 8–23)
CO2: 28 mmol/L (ref 22–32)
Calcium: 8.4 mg/dL — ABNORMAL LOW (ref 8.9–10.3)
Chloride: 100 mmol/L (ref 98–111)
Creatinine, Ser: 0.57 mg/dL (ref 0.44–1.00)
GFR, Estimated: >60 mL/min (ref >60)
Glucose, Bld: 135 mg/dL — ABNORMAL HIGH (ref 70–99)
Phosphorus: 2.3 mg/dL — ABNORMAL LOW (ref 2.5–4.6)
Potassium: 3.9 mmol/L (ref 3.5–5.1)
Sodium: 135 mmol/L (ref 135–145)

## 2024-03-10 LAB — CBC
HCT: 25.7 % — ABNORMAL LOW (ref 36.0–46.0)
Hemoglobin: 8.5 g/dL — ABNORMAL LOW (ref 12.0–15.0)
MCH: 32.7 pg (ref 26.0–34.0)
MCHC: 33.1 g/dL (ref 30.0–36.0)
MCV: 98.8 fL (ref 80.0–100.0)
Platelets: 60 10*3/uL — ABNORMAL LOW (ref 150–400)
RBC: 2.6 MIL/uL — ABNORMAL LOW (ref 3.87–5.11)
RDW: 14.8 % (ref 11.5–15.5)
WBC: 7 10*3/uL (ref 4.0–10.5)
nRBC: 0.3 % — ABNORMAL HIGH (ref 0.0–0.2)

## 2024-03-10 LAB — GLUCOSE, CAPILLARY
Glucose-Capillary: 136 mg/dL — ABNORMAL HIGH (ref 70–99)
Glucose-Capillary: 161 mg/dL — ABNORMAL HIGH (ref 70–99)

## 2024-03-10 LAB — MAGNESIUM: Magnesium: 1.7 mg/dL (ref 1.7–2.4)

## 2024-03-10 MED ORDER — ACETAMINOPHEN 500 MG PO TABS: ORAL_TABLET | ORAL | Status: AC

## 2024-03-10 MED ORDER — SENNOSIDES-DOCUSATE SODIUM 8.6-50 MG PO TABS
2.0000 | ORAL_TABLET | Freq: Two times a day (BID) | ORAL | Status: DC
  Administered 2024-03-10: 2 via ORAL
  Filled 2024-03-10: qty 2

## 2024-03-10 MED ORDER — GABAPENTIN 300 MG PO CAPS: 600.0000 mg | ORAL_CAPSULE | Freq: Three times a day (TID) | ORAL | 0 refills | Status: AC

## 2024-03-10 MED ORDER — POLYETHYLENE GLYCOL 3350 17 G PO PACK: 17.0000 g | PACK | Freq: Two times a day (BID) | ORAL | Status: DC | PRN

## 2024-03-10 MED ORDER — SENNOSIDES-DOCUSATE SODIUM 8.6-50 MG PO TABS: 2.0000 | ORAL_TABLET | Freq: Two times a day (BID) | ORAL | Status: DC | PRN

## 2024-03-10 MED ORDER — BUPRENORPHINE 20 MCG/HR TD PTWK
1.0000 | MEDICATED_PATCH | TRANSDERMAL | 0 refills | Status: AC
Start: 2024-03-10 — End: ?

## 2024-03-10 MED ORDER — ENSURE PLUS HIGH PROTEIN PO LIQD
237.0000 mL | Freq: Three times a day (TID) | ORAL | Status: AC
Start: 2024-03-10 — End: ?

## 2024-03-10 MED ORDER — SENNOSIDES-DOCUSATE SODIUM 8.6-50 MG PO TABS: 2.0000 | ORAL_TABLET | Freq: Two times a day (BID) | ORAL | Status: AC | PRN

## 2024-03-10 MED ORDER — POLYETHYLENE GLYCOL 3350 17 G PO PACK
17.0000 g | PACK | Freq: Once | ORAL | Status: AC
  Administered 2024-03-10: 17 g via ORAL
  Filled 2024-03-10: qty 1

## 2024-03-10 NOTE — Care Management Important Message (Signed)
 Important Message  Patient Details  Name: Teresa Benitez MRN: [id number] Date of Birth: 12/16/1955   Important Message Given:  Yes - Medicare IM     Felix Host 03/10/2024, 2:37 PM

## 2024-03-10 NOTE — Plan of Care (Signed)
  Problem: Education: Goal: Knowledge of General Education information will improve Description: Including pain rating scale, medication(s)/side effects and non-pharmacologic comfort measures Outcome: Progressing   Problem: Health Behavior/Discharge Planning: Goal: Ability to manage health-related needs will improve Outcome: Progressing   Problem: Clinical Measurements: Goal: Ability to maintain clinical measurements within normal limits will improve Outcome: Progressing Goal: Will remain free from infection Outcome: Progressing Goal: Diagnostic test results will improve Outcome: Progressing Goal: Respiratory complications will improve Outcome: Progressing Goal: Cardiovascular complication will be avoided Outcome: Progressing   Problem: Activity: Goal: Risk for activity intolerance will decrease Outcome: Progressing   Problem: Nutrition: Goal: Adequate nutrition will be maintained Outcome: Progressing   Problem: Coping: Goal: Level of anxiety will decrease Outcome: Progressing   Problem: Elimination: Goal: Will not experience complications related to bowel motility Outcome: Progressing Goal: Will not experience complications related to urinary retention Outcome: Progressing   Problem: Pain Managment: Goal: General experience of comfort will improve and/or be controlled Outcome: Progressing   Problem: Safety: Goal: Ability to remain free from injury will improve Outcome: Progressing   Problem: Skin Integrity: Goal: Risk for impaired skin integrity will decrease Outcome: Progressing   Problem: Education: Goal: Ability to describe self-care measures that may prevent or decrease complications (Diabetes Survival Skills Education) will improve Outcome: Progressing Goal: Individualized Educational Video(s) Outcome: Progressing   Problem: Coping: Goal: Ability to adjust to condition or change in health will improve Outcome: Progressing   Problem: Fluid  Volume: Goal: Ability to maintain a balanced intake and output will improve Outcome: Progressing   Problem: Health Behavior/Discharge Planning: Goal: Ability to identify and utilize available resources and services will improve Outcome: Progressing Goal: Ability to manage health-related needs will improve Outcome: Progressing   Problem: Metabolic: Goal: Ability to maintain appropriate glucose levels will improve Outcome: Progressing   Problem: Nutritional: Goal: Maintenance of adequate nutrition will improve Outcome: Progressing Goal: Progress toward achieving an optimal weight will improve Outcome: Progressing   Problem: Skin Integrity: Goal: Risk for impaired skin integrity will decrease Outcome: Progressing   Problem: Tissue Perfusion: Goal: Adequacy of tissue perfusion will improve Outcome: Progressing   Problem: Education: Goal: Knowledge of General Education information will improve Description: Including pain rating scale, medication(s)/side effects and non-pharmacologic comfort measures Outcome: Progressing   Problem: Health Behavior/Discharge Planning: Goal: Ability to manage health-related needs will improve Outcome: Progressing   Problem: Clinical Measurements: Goal: Ability to maintain clinical measurements within normal limits will improve Outcome: Progressing Goal: Will remain free from infection Outcome: Progressing Goal: Diagnostic test results will improve Outcome: Progressing Goal: Respiratory complications will improve Outcome: Progressing Goal: Cardiovascular complication will be avoided Outcome: Progressing   Problem: Activity: Goal: Risk for activity intolerance will decrease Outcome: Progressing   Problem: Nutrition: Goal: Adequate nutrition will be maintained Outcome: Progressing   Problem: Coping: Goal: Level of anxiety will decrease Outcome: Progressing   Problem: Elimination: Goal: Will not experience complications related to  bowel motility Outcome: Progressing Goal: Will not experience complications related to urinary retention Outcome: Progressing   Problem: Pain Managment: Goal: General experience of comfort will improve and/or be controlled Outcome: Progressing   Problem: Safety: Goal: Ability to remain free from injury will improve Outcome: Progressing   Problem: Skin Integrity: Goal: Risk for impaired skin integrity will decrease Outcome: Progressing

## 2024-03-10 NOTE — TOC Transition Note (Signed)
 Transition of Care [facility]) - Discharge Note   Patient Details  Name: Teresa Benitez MRN: [id number] Date of Birth: [birth year]  Transition of Care [facility]) CM/SW Contact:  Katrinka Parr, LCSW Phone Number: 03/10/2024, 2:30 PM   Clinical Narrative:     Per MD patient ready for DC to [facility]. RN, patient, and facility notified of DC. Discharge Summary and FL2 sent to facility. RN to call report prior to discharge [phone]). DC packet on chart. Ambulance transport requested for patient.   CSW will sign off for now as social work intervention is no longer needed. Please consult us  again if new needs arise.   Final next level of care: Skilled Nursing Facility Barriers to Discharge: No Barriers Identified   Discharge Placement              Patient chooses bed at:  [facility]) Patient to be transferred to facility by: [facility]   Patient and family notified of of transfer: 03/10/24  Discharge Plan and Services Additional resources added to the After Visit Summary for                                       Social Drivers of Health (SDOH) Interventions SDOH Screenings   Food Insecurity: No Food Insecurity (03/07/2024)  Housing: Unknown (03/07/2024)  Transportation Needs: No Transportation Needs (03/07/2024)  Utilities: Not At Risk (03/07/2024)  Financial Resource Strain: Low Risk  (05/23/2023)   [facility]  Physical Activity: Unknown (05/23/2023)   Received from [facility]  Recent Concern: Physical Activity - Inactive (05/23/2023)   Received from [facility]  Social Connections: Socially Isolated (03/07/2024)  Stress: Stress Concern Present (09/12/2023)   Received from [facility]  Tobacco Use: High Risk (03/08/2024)  Health Literacy: Low Risk  (09/12/2023)   Received from [facility]     Readmission Risk Interventions     No data to display

## 2024-03-10 NOTE — Discharge Summary (Signed)
 Physician Discharge Summary  Teresa Benitez ZOX:[id number] DOB: [birth year] DOA: 03/07/2024  PCP: Avelina Bode, DO  Admit date: 03/07/2024 Discharge date: 03/10/24  Admitted From: Home Disposition: SNF Recommendations for Outpatient Follow-up:  Outpatient follow-up with orthopedic surgery as below Patient is at risk for polypharmacy on multiple sedating medications.  Recommend reviewing meds and adjusting as appropriate Check CBC and CMP at follow-up Please follow up on the following pending results: None   Discharge Condition: Stable CODE STATUS: Full code  Follow-up Information     Janeth Medicus, MD Follow up in 2 week(s).   Specialty: Orthopedic Surgery Why: For wound re-check, For suture removal Contact information: [address] [place] [address] [phone]                 Hospital course 68 year old F with PMH of A-fib on Eliquis, liver cirrhosis, COPD, DM-2, HTN, hypothyroidism, chronic pain/fibromyalgia and tobacco use disorder brought to ED due to left hip pain after she had a fall at home, and admitted with comminuted and mildly displaced intertrochanteric left hip fracture.  Orthopedic surgery consulted.  Patient is on multiple sedating medications with high risk for polypharmacy.   Patient underwent intramedullary implant by Dr. Hiram Lukes on 03/08/2024.  She has some postoperative blood loss anemia but hemoglobin is stabilized at 8.5.  She is cleared for discharge by orthopedic surgery.  Therapy recommended SNF.  Outpatient follow-up with orthopedic surgery as above.  See individual problem list below for more.   Problems addressed during this hospitalization Accidental fall at home-likely due to polypharmacy.  On multiple sedating medications. Left hip fracture due to accidental fall at home -S/p intramedullary implant by Dr. Hiram Lukes on 6/15 -Vitamin D level normal at 40.3. -Pain control-Tylenol, ibuprofen , home Butrans,  oxycodone  and gabapentin  -Already on Eliquis which would serve DVT prophylaxis -Bowel regimen--Senokot-S -Narcan as needed -PT/OT at SNF -Outpatient follow-up with orthopedic surgery as above   Paroxysmal A-fib: On Toprol -XL and Eliquis at home. -Continue Toprol -XL and Eliquis   NIDDM-2 with hyperglycemia: A1c 6.4%.  On metformin at home. - Continue home metformin   Postoperative blood loss anemia: Reported EBL 50 cc although Hgb dropped about 4 g but stable afterward.  No overt bleeding elsewhere.  Anemia panel basically normal Recent Labs    08/08/23 1341 03/07/24 0124 03/08/24 1348 03/09/24 0951 03/10/24 0624  HGB 13.2 13.0 10.7* 8.8* 8.5*  -Recheck CBC in 1 week  Chronic COPD: Stable -Continue Trelegy, albuterol  and Singulair    Chronic pain syndrome/fibromyalgia: On Robaxin , Butrans, maximum dose of gabapentin  at home. -Continue gabapentin  to 600 mg 3 times daily.  She was on 1200 mg 3 times daily at home.   Thrombocytopenia: Stable -Check anemia panel   Hypothyroidism -Continue Synthroid    Dyslipidemia -Continue Lipitor   Tobacco abuse: Reports smoking about half a pack a day -Encouraged smoking cessation -Continue nicotine patch   Thrombocytopenia: Mild -Check CBC in 1 to 2 weeks   Polypharmacy: Butrans, Remeron , gabapentin , Bentyl , Robaxin , oxycodone ... Discussed risk of polypharmacy with patient.  She is already having nausea and dizziness.  At this time, she is willing to cut down on gabapentin  to 600 mg 3 times daily.  Also discontinued Bentyl . - Encourage patient to discuss with prescribing providers.   Class I obesity Body mass index is 30.59 kg/m.               Time spent 35 minutes  Vital signs Vitals:   03/09/24 1710 03/09/24 2029 03/10/24 0425  03/10/24 0500  BP: (!) 111/59 134/63 (!) 127/101 (!) 143/65  Pulse: 87 75 83   Temp: 99.5 F (37.5 C) 98 F (36.7 C) 98.1 F (36.7 C)   Resp: 17 16 16    Height:      Weight:      SpO2:  90% 96% 97%   TempSrc: Oral  Oral   BMI (Calculated):         Discharge exam  GENERAL: No apparent distress.  Nontoxic. HEENT: MMM.  Vision and hearing grossly intact.  NECK: Supple.  No apparent JVD.  RESP:  No IWOB.  Fair aeration bilaterally. CVS:  RRR. Heart sounds normal.  ABD/GI/GU: BS+. Abd soft, NTND.  MSK/EXT:  Moves extremities. No apparent deformity. No edema.  SKIN: Dressing over left thigh DCI. NEURO: AA.  Oriented appropriately.  No apparent focal neuro deficit. PSYCH: Calm. Normal affect  Discharge Instructions Discharge Instructions     Diet - low sodium heart healthy   Complete by: As directed    Diet Carb Modified   Complete by: As directed    Discharge wound care:   Complete by: As directed    Reinforce dressing   Increase activity slowly   Complete by: As directed       Allergies as of 03/10/2024       Reactions   Ampicillin Hives, Shortness Of Breath, Swelling   Lips swelling Has patient had a PCN reaction causing immediate rash, facial/tongue/throat swelling, SOB or lightheadedness with hypotension: Yes Has patient had a PCN reaction causing severe rash involving mucus membranes or skin necrosis: No Has patient had a PCN reaction that required hospitalization: No Has patient had a PCN reaction occurring within the last 10 years: Yes If all of the above answers are NO, then may proceed with Cephalosporin use.   Ciprofloxacin Hives   Sulfamethoxazole Hives   Carbamazepine     Nausea, headache   Dilantin  [phenytoin  Sodium Extended]    nausea   Anastrozole Other (See Comments)   Fatigue, hot flashes        Medication List     STOP taking these medications    dicyclomine  10 MG capsule Commonly known as: BENTYL    gabapentin  600 MG tablet Commonly known as: NEURONTIN  Replaced by: gabapentin  300 MG capsule       TAKE these medications    acetaminophen 500 MG tablet Commonly known as: TYLENOL Take 2 tablets (1,000 mg total) by  mouth every 8 (eight) hours for 5 days, THEN 2 tablets (1,000 mg total) every 8 (eight) hours as needed for up to 5 days. Start taking on: March 10, 2024   albuterol  108 (90 Base) MCG/ACT inhaler Commonly known as: VENTOLIN  HFA Inhale 2 puffs into the lungs every 6 (six) hours as needed for wheezing or shortness of breath.   albuterol  (2.5 MG/3ML) 0.083% nebulizer solution Commonly known as: PROVENTIL  Take 3 mLs (2.5 mg total) by nebulization every 6 (six) hours as needed for wheezing or shortness of breath.   amLODipine  5 MG tablet Commonly known as: NORVASC  Take 5 mg by mouth daily.   apixaban 5 MG Tabs tablet Commonly known as: ELIQUIS Take 5 mg by mouth 2 (two) times daily.   atorvastatin  10 MG tablet Commonly known as: LIPITOR Take 1 tablet by mouth daily.   buprenorphine  20 MCG/HR Ptwk Commonly known as: BUTRANS Place 1 patch onto the skin once a week. Change patch on Thursday.   Cholecalciferol 25 MCG (1000 UT) capsule Take 2,000 Units  by mouth daily.   cyanocobalamin 1000 MCG tablet Take 1,000 mcg by mouth daily.   diclofenac Sodium 1 % Gel Commonly known as: VOLTAREN Apply 2 g topically every 6 (six) hours as needed (knee pain).   Dry Eye Relief Drops 0.2-0.2-1 % Soln Generic drug: Glycerin-Hypromellose-PEG 400 Place 1 drop into both eyes as needed (dry eyes).   famotidine  20 MG tablet Commonly known as: PEPCID  Take 20 mg by mouth 2 (two) times daily.   feeding supplement Liqd Take 237 mLs by mouth 3 (three) times daily between meals.   gabapentin  300 MG capsule Commonly known as: NEURONTIN  Take 2 capsules (600 mg total) by mouth 3 (three) times daily. Replaces: gabapentin  600 MG tablet   hydrochlorothiazide  25 MG tablet Commonly known as: HYDRODIURIL  Take 25 mg by mouth daily.   ibuprofen  200 MG tablet Commonly known as: ADVIL  Take 400 mg by mouth every 6 (six) hours as needed for mild pain (pain score 1-3) or headache.   letrozole 2.5 MG  tablet Commonly known as: FEMARA Take 2.5 mg by mouth daily.   levothyroxine  137 MCG tablet Commonly known as: SYNTHROID  Take 137 mcg by mouth daily before breakfast.   lisinopril  40 MG tablet Commonly known as: ZESTRIL  Take 40 mg by mouth daily.   metFORMIN 500 MG 24 hr tablet Commonly known as: GLUCOPHAGE-XR Take 500 mg by mouth daily with breakfast.   methocarbamol  750 MG tablet Commonly known as: ROBAXIN  Take 750 mg by mouth 3 (three) times daily. What changed: Another medication with the same name was removed. Continue taking this medication, and follow the directions you see here.   metoprolol  succinate 50 MG 24 hr tablet Commonly known as: TOPROL -XL Take 50 mg by mouth daily.   mirtazapine  30 MG tablet Commonly known as: REMERON  Take 30 mg by mouth at bedtime.   montelukast  10 MG tablet Commonly known as: SINGULAIR  TAKE 1 TABLET BY MOUTH EVERYDAY AT BEDTIME   Multi-Vitamins Tabs Take 1 tablet by mouth daily.   naloxone 4 MG/0.1ML Liqd nasal spray kit Commonly known as: NARCAN Place 0.4 mg into the nose once.   ondansetron  8 MG tablet Commonly known as: ZOFRAN  Take 8 mg by mouth every 8 (eight) hours as needed for nausea.   Oxycodone  HCl 10 MG Tabs Take 1 tablet (10 mg total) by mouth 4 (four) times daily as needed for up to 7 days (moderate pain). What changed:  when to take this reasons to take this   pantoprazole  40 MG tablet Commonly known as: PROTONIX  TAKE 1 TABLET BY MOUTH EVERY DAY BEFORE BREAKFAST   potassium chloride 10 MEQ tablet Commonly known as: KLOR-CON M Take 10 mEq by mouth daily.   promethazine  25 MG tablet Commonly known as: PHENERGAN  TAKE 1 TABLET BY MOUTH EVERY 8 HOURS AS NEEDED FOR NAUSEA AND VOMITING What changed: See the new instructions.   rOPINIRole 0.5 MG tablet Commonly known as: REQUIP Take 0.5 mg by mouth 3 (three) times daily.   senna-docusate 8.6-50 MG tablet Commonly known as: Senokot-S Take 2 tablets by mouth  2 (two) times daily as needed for moderate constipation.   sertraline  100 MG tablet Commonly known as: ZOLOFT  Take 100 mg by mouth daily.   Trelegy Ellipta  200-62.5-25 MCG/ACT Aepb Generic drug: Fluticasone-Umeclidin-Vilant Inhale 1 puff into the lungs daily.   Varenicline Tartrate (Starter) 0.5 MG X 11 & 1 MG X 42 Tbpk Take by mouth.  Discharge Care Instructions  (From admission, onward)           Start     Ordered   03/10/24 0000  Discharge wound care:       Comments: Reinforce dressing   03/10/24 0847            Consultations: Orthopedic surgery  Procedures/Studies: 6/15-intramedullary implant for intertrochanteric, peritrochanteric and subtrochanteric fracture    DG FEMUR MIN 2 VIEWS LEFT Result Date: 03/08/2024 CLINICAL DATA:  Elective surgery. Intraoperative fluoroscopy. Fixation of left femoral intertrochanteric fracture. EXAM: LEFT FEMUR 2 VIEWS COMPARISON:  Left femur radiographs 03/07/2024 FINDINGS: Images were performed intraoperatively without the presence of a radiologist. The patient is undergoing long cephalomedullary nail fixation of the previously seen displaced and comminuted distal intertrochanteric and proximal femoral diaphyseal fracture. No hardware complication is seen. Total fluoroscopy images: 7 Total fluoroscopy time: 59 seconds Total dose: Radiation Exposure Index (as provided by the fluoroscopic device): 11.7 mGy air Kerma Please see intraoperative findings for further detail. IMPRESSION: Intraoperative fluoroscopy for fixation of left femoral intertrochanteric and proximal femoral diaphyseal fracture. Electronically Signed   By: Bertina Broccoli M.D.   On: 03/08/2024 13:32   DG C-Arm 1-60 Min-No Report Result Date: 03/08/2024 Fluoroscopy was utilized by the requesting physician.  No radiographic interpretation.   DG Femur Min 2 Views Left Result Date: 03/07/2024 EXAM: 2 VIEW(S) XRAY OF THE LEFT FEMUR 03/07/2024 01:42:27 AM  COMPARISON: None available. CLINICAL HISTORY: Eval for fx. Fall; Left hip pain. FINDINGS: BONES AND JOINTS: Comminuted, mildly displaced intertrochanteric left hip fracture with apex lateral angulation of the dominant fracture fragments and a mildly displaced lesser trochanteric fragment. SOFT TISSUES: The soft tissues are unremarkable. IMPRESSION: 1. Comminuted, mildly displaced intertrochanteric left hip fracture, as above. Electronically signed by: Zadie Herter MD 03/07/2024 01:47 AM EDT RP Workstation: [id number]   DG Pelvis 1-2 Views Result Date: 03/07/2024 EXAM: 1 VIEW(S) XRAY OF THE PELVIS 03/07/2024 01:42:27 AM COMPARISON: None available. CLINICAL HISTORY: Pain in left hip. Fall; Left hip pain. FINDINGS: BONES AND JOINTS: Comminuted, mildly displaced intertrochanteric left hip fracture with foreshortening and varus angulation. SOFT TISSUES: The soft tissues are unremarkable. IMPRESSION: 1. Comminuted, mildly displaced intertrochanteric left hip fracture. Electronically signed by: Zadie Herter MD 03/07/2024 01:46 AM EDT RP Workstation: [id number]       The results of significant diagnostics from this hospitalization (including imaging, microbiology, ancillary and laboratory) are listed below for reference.     Microbiology: Recent Results (from the past 240 hours)  Surgical PCR screen     Status: None   Collection Time: 03/07/24  8:53 AM   Specimen: Nasal Mucosa; Nasal Swab  Result Value Ref Range Status   MRSA, PCR NEGATIVE NEGATIVE Final   Staphylococcus aureus NEGATIVE NEGATIVE Final    Comment: (NOTE) The Xpert SA Assay (FDA approved for NASAL specimens in patients 91 years of age and older), is one component of a comprehensive surveillance program. It is not intended to diagnose infection nor to guide or monitor treatment. Performed at [facility], [address] [place], [place] [address]      Labs:  CBC: Recent Labs  Lab 03/07/24 0124 03/08/24 1348  03/09/24 0951 03/10/24 0624  WBC 15.8* 10.2 10.4 7.0  NEUTROABS 13.4*  --   --   --   HGB 13.0 10.7* 8.8* 8.5*  HCT 39.1 31.8* 26.7* 25.7*  MCV 97.0 96.7 97.8 98.8  PLT 106* 68* 73* 60*   BMP &GFR Recent Labs  Lab 03/07/24 0124 03/07/24 0333 03/08/24 1348 03/09/24 0547 03/09/24 0951 03/10/24 0624  NA 136  --  134*  --  135 135  K 4.0  --  3.9  --  4.0 3.9  CL 101  --  99  --  100 100  CO2 27  --  27  --  28 28  GLUCOSE 309*  --  177*  --  174* 135*  BUN 21  --  20  --  17 14  CREATININE 0.77  --  0.84  --  0.74 0.57  CALCIUM 9.5  --  9.1  --  8.4* 8.4*  MG  --  1.8  --  1.8  --  1.7  PHOS  --   --   --   --  2.6 2.3*   Estimated Creatinine Clearance: 87.1 mL/min (by C-G formula based on SCr of 0.57 mg/dL). Liver & Pancreas: Recent Labs  Lab 03/07/24 0124 03/08/24 1348 03/09/24 0951 03/10/24 0624  AST 35 27  --   --   ALT 18 19  --   --   ALKPHOS 97 64  --   --   BILITOT 0.9 0.7  --   --   PROT 5.9* 5.6*  --   --   ALBUMIN 3.0* 2.8* 2.4* 2.4*   No results for input(s): LIPASE, AMYLASE in the last 168 hours. No results for input(s): AMMONIA in the last 168 hours. Diabetic: Recent Labs    03/08/24 1348  HGBA1C 6.4*   Recent Labs  Lab 03/09/24 1159 03/09/24 1706 03/09/24 2028 03/10/24 0810 03/10/24 1143  GLUCAP 171* 175* 147* 136* 161*   Cardiac Enzymes: No results for input(s): CKTOTAL, CKMB, CKMBINDEX, TROPONINI in the last 168 hours. No results for input(s): PROBNP in the last 8760 hours. Coagulation Profile: Recent Labs  Lab 03/07/24 0333  INR 1.2   Thyroid Function Tests: No results for input(s): TSH, T4TOTAL, FREET4, T3FREE, THYROIDAB in the last 72 hours. Lipid Profile: No results for input(s): CHOL, HDL, LDLCALC, TRIG, CHOLHDL, LDLDIRECT in the last 72 hours. Anemia Panel: Recent Labs    03/09/24 0547 03/09/24 0951 03/09/24 1435  VITAMINB12  --   --  723  FOLATE 12.1  --   --   FERRITIN  --    --  88  TIBC  --   --  267  IRON  --   --  46  RETICCTPCT  --  3.8*  --    Urine analysis:    Component Value Date/Time   COLORURINE DARK YELLOW 07/11/2017 1635   APPEARANCEUR CLOUDY (A) 07/11/2017 1635   LABSPEC 1.014 07/11/2017 1635   PHURINE 8.0 07/11/2017 1635   GLUCOSEU NEGATIVE 07/11/2017 1635   GLUCOSEU NEGATIVE 02/04/2007 1521   HGBUR NEGATIVE 07/11/2017 1635   BILIRUBINUR NEGATIVE 10/21/2013 0637   KETONESUR NEGATIVE 07/11/2017 1635   PROTEINUR NEGATIVE 07/11/2017 1635   UROBILINOGEN 0.2 10/21/2013 0637   NITRITE POSITIVE (A) 07/11/2017 1635   LEUKOCYTESUR TRACE (A) 07/11/2017 1635   Sepsis Labs: Invalid input(s): PROCALCITONIN, LACTICIDVEN   SIGNED:  Malikye Reppond T Nevin Kozuch, MD  [facility] 03/10/2024, 1:20 PM

## 2024-03-10 NOTE — Evaluation (Signed)
 Occupational Therapy Evaluation Patient Details Name: Teresa Benitez MRN: [id number] DOB: 03-23-1956 Today's Date: 03/10/2024   History of Present Illness   Pt is a 68 y.o. F presenting to [facility] ED on 03/07/24 following a fall w/ reports of left-sided leg pain. IMG showed left hip fx. Pt is s/p intertrochanteric fixation w/ intramedullary rod on 03/08/24. PMH is significant for A-fib, dyslipidemia, HTN, cirrhosis, COPD, tobacco abuse, DM, chronic pain (Fibromyalgia), and hypothyroidism.     Clinical Impressions PTA, pt lives with family, typically completely Independent with ADLs, IADLs, driving and mobility w/ intermittent cane use if R knee painful. Pt presents now with deficits in LLE pain, standing balance and endurance. Pt requires Mod A for bed mobility, Min A for transfers using RW w/ difficulty lifting LLE. Pt requires Min A for UB ADL and up to Max A for LB ADLs d/t deficits. Provided education on initial exercises to complete, ADL strategies and positioning in bed to minimize pain. Based on current presentation, recommend continued inpatient follow up therapy, <3 hours/day at [facility].     If plan is discharge home, recommend the following:   A lot of help with walking and/or transfers;A lot of help with bathing/dressing/bathroom     Functional Status Assessment   Patient has had a recent decline in their functional status and demonstrates the ability to make significant improvements in function in a reasonable and predictable amount of time.     Equipment Recommendations   Other (comment);BSC/3in1 (RW)     Recommendations for Other Services         Precautions/Restrictions   Precautions Precautions: Fall Recall of Precautions/Restrictions: Intact Restrictions Weight Bearing Restrictions Per Provider Order: Yes LLE Weight Bearing Per Provider Order: Weight bearing as tolerated     Mobility Bed Mobility Overal bed mobility: Needs Assistance Bed Mobility: Rolling,  Sidelying to Sit, Sit to Sidelying Rolling: Supervision Sidelying to sit: Mod assist, HOB elevated, Used rails     Sit to sidelying: Mod assist General bed mobility comments: able to roll to side, use bedrails to lift trunk with assist and OT supporting LLE. Assist forBLE back to bed in sidelying position    Transfers Overall transfer level: Needs assistance Equipment used: Rolling walker (2 wheels) Transfers: Sit to/from Stand Sit to Stand: Min assist, From elevated surface           General transfer comment: Min A to stand from elevated bedside, able to take sidesteps with CGA though difficulty lifting L foot      Balance Overall balance assessment: Needs assistance Sitting-balance support: Feet supported, Single extremity supported Sitting balance-Leahy Scale: Fair     Standing balance support: Bilateral upper extremity supported, During functional activity, Reliant on assistive device for balance Standing balance-Leahy Scale: Poor                             ADL either performed or assessed with clinical judgement   ADL Overall ADL's : Needs assistance/impaired Eating/Feeding: Independent   Grooming: Set up;Sitting   Upper Body Bathing: Minimal assistance;Sitting   Lower Body Bathing: Maximal assistance;Sit to/from stand   Upper Body Dressing : Set up;Sitting   Lower Body Dressing: Maximal assistance;Sitting/lateral leans;Sit to/from stand   Toilet Transfer: Minimal assistance;Stand-pivot;Rolling walker (2 wheels)   Toileting- Clothing Manipulation and Hygiene: Maximal assistance;Sit to/from stand;Sitting/lateral lean         General ADL Comments: Discussed LB dressing techniques, easier clothing to manage, positioning in  bed to minimize pain and basic LE exercises to work on to improve LE function     Vision Ability to See in Adequate Light: 0 Adequate Patient Visual Report: No change from baseline Vision Assessment?: No apparent visual  deficits     Perception         Praxis         Pertinent Vitals/Pain Pain Assessment Pain Assessment: Faces Faces Pain Scale: Hurts whole lot Pain Location: L hip Pain Descriptors / Indicators: Guarding, Grimacing Pain Intervention(s): Monitored during session, RN gave pain meds during session     Extremity/Trunk Assessment Upper Extremity Assessment Upper Extremity Assessment: Overall WFL for tasks assessed;Right hand dominant   Lower Extremity Assessment Lower Extremity Assessment: Defer to PT evaluation   Cervical / Trunk Assessment Cervical / Trunk Assessment: Kyphotic   Communication Communication Communication: No apparent difficulties   Cognition Arousal: Alert Behavior During Therapy: WFL for tasks assessed/performed Cognition: No apparent impairments                               Following commands: Intact       Cueing  General Comments   Cueing Techniques: Verbal cues;Visual cues;Gestural cues      Exercises Exercises: Other exercises Other Exercises Other Exercises: LLE extension EOB x 5  (gravity minimized with washcloth under foot)   Shoulder Instructions      Home Living Family/patient expects to be discharged to:: Private residence Living Arrangements: Children;Other relatives (2 special needs grandchildren, daughter and son in law) Available Help at Discharge: Family;Available PRN/intermittently Type of Home: House Home Access: Level entry     Home Layout: Two level;Able to live on main level with bedroom/bathroom     Bathroom Shower/Tub: Producer, television/film/video: Standard     Home Equipment: Cane - single point   Additional Comments: Daughter has MS, currently in a flare and at hospital on day of OT eval      Prior Functioning/Environment Prior Level of Function : Independent/Modified Independent;Driving;History of Falls (last six months)             Mobility Comments: Pt uses a SPC for  mobilization when R knee is in pain, but is independent with mobility apart from these instances. ADLs Comments: independent    OT Problem List: Decreased strength;Decreased activity tolerance;Impaired balance (sitting and/or standing);Decreased knowledge of use of DME or AE;Decreased knowledge of precautions;Pain   OT Treatment/Interventions: Self-care/ADL training;Therapeutic exercise;Energy conservation;DME and/or AE instruction;Therapeutic activities;Patient/family education;Balance training      OT Goals(Current goals can be found in the care plan section)   Acute Rehab OT Goals Patient Stated Goal: pain control, be able to move more w/ less pain OT Goal Formulation: With patient Time For Goal Achievement: 03/24/24 Potential to Achieve Goals: Good ADL Goals Pt Will Perform Lower Body Bathing: with min assist;sitting/lateral leans;sit to/from stand Pt Will Perform Lower Body Dressing: with min assist;sitting/lateral leans;sit to/from stand;with adaptive equipment Pt Will Transfer to Toilet: with contact guard assist;ambulating Additional ADL Goal #1: Pt to complete bed mobility with Supervision in prep for EOB/OOB ADLs.   OT Frequency:  Min 2X/week    Co-evaluation              AM-PAC OT 6 Clicks Daily Activity     Outcome Measure Help from another person eating meals?: None Help from another person taking care of personal grooming?: A Little Help from another person  toileting, which includes using toliet, bedpan, or urinal?: A Lot Help from another person bathing (including washing, rinsing, drying)?: A Lot Help from another person to put on and taking off regular upper body clothing?: A Little Help from another person to put on and taking off regular lower body clothing?: A Lot 6 Click Score: 16   End of Session Equipment Utilized During Treatment: Gait belt;Rolling walker (2 wheels) Nurse Communication: Mobility status  Activity Tolerance: Patient limited by  pain Patient left: in bed;with call bell/phone within reach;with bed alarm set  OT Visit Diagnosis: Unsteadiness on feet (R26.81);Other abnormalities of gait and mobility (R26.89);Pain Pain - Right/Left: Left Pain - part of body: Hip                Time: 9562-1308 OT Time Calculation (min): 41 min Charges:  OT General Charges $OT Visit: 1 Visit OT Evaluation $OT Eval Moderate Complexity: 1 Mod OT Treatments $Self Care/Home Management : 8-22 mins $Therapeutic Activity: 8-22 mins  Lawrence Pretty, OTR/L Acute Rehab Services Office: [phone]   Shireen Dory 03/10/2024, 12:57 PM

## 2024-03-11 ENCOUNTER — Telehealth: Payer: Self-pay | Admitting: Gastroenterology

## 2024-03-11 NOTE — Telephone Encounter (Signed)
 Patient called today to say she was sorry she missed her OV on Monday. She is in [facility] with a broken hip.

## 2024-03-16 ENCOUNTER — Ambulatory Visit (HOSPITAL_COMMUNITY): Admission: RE | Admit: 2024-03-16 | Source: Ambulatory Visit

## 2024-03-28 ENCOUNTER — Other Ambulatory Visit: Payer: Self-pay | Admitting: Nurse Practitioner

## 2024-03-28 DIAGNOSIS — R911 Solitary pulmonary nodule: Secondary | ICD-10-CM

## 2024-03-28 DIAGNOSIS — J449 Chronic obstructive pulmonary disease, unspecified: Secondary | ICD-10-CM

## 2024-03-31 ENCOUNTER — Other Ambulatory Visit: Payer: Self-pay | Admitting: Internal Medicine

## 2024-04-08 ENCOUNTER — Ambulatory Visit (HOSPITAL_COMMUNITY): Admission: RE | Admit: 2024-04-08 | Source: Ambulatory Visit

## 2024-05-28 ENCOUNTER — Other Ambulatory Visit: Payer: Self-pay | Admitting: Internal Medicine

## 2024-05-28 ENCOUNTER — Other Ambulatory Visit (INDEPENDENT_AMBULATORY_CARE_PROVIDER_SITE_OTHER): Payer: Self-pay | Admitting: Gastroenterology

## 2024-06-25 ENCOUNTER — Other Ambulatory Visit: Payer: Self-pay | Admitting: Internal Medicine

## 2024-07-08 ENCOUNTER — Encounter (INDEPENDENT_AMBULATORY_CARE_PROVIDER_SITE_OTHER): Payer: Self-pay | Admitting: Gastroenterology
